# Patient Record
Sex: Female | Born: 1950 | Race: Black or African American | Hispanic: No | Marital: Married | Smoking: Former smoker
Health system: Southern US, Community
[De-identification: ages and names within clinical notes are randomized; demographics above are authoritative.]

## PROBLEM LIST (undated history)

## (undated) DIAGNOSIS — J449 Chronic obstructive pulmonary disease, unspecified: Secondary | ICD-10-CM

## (undated) HISTORY — PX: OTHER SURGICAL HISTORY: SHX169

## (undated) HISTORY — DX: Chronic obstructive pulmonary disease, unspecified: J44.9

---

## 2011-01-29 DIAGNOSIS — J449 Chronic obstructive pulmonary disease, unspecified: Secondary | ICD-10-CM | POA: Insufficient documentation

## 2011-09-20 DIAGNOSIS — D509 Iron deficiency anemia, unspecified: Secondary | ICD-10-CM | POA: Insufficient documentation

## 2013-03-24 DIAGNOSIS — E663 Overweight: Secondary | ICD-10-CM | POA: Insufficient documentation

## 2016-12-25 DIAGNOSIS — G4733 Obstructive sleep apnea (adult) (pediatric): Secondary | ICD-10-CM | POA: Insufficient documentation

## 2017-12-27 DIAGNOSIS — I10 Essential (primary) hypertension: Secondary | ICD-10-CM | POA: Diagnosis present

## 2018-03-29 DIAGNOSIS — D693 Immune thrombocytopenic purpura: Secondary | ICD-10-CM | POA: Insufficient documentation

## 2019-09-17 DIAGNOSIS — G8929 Other chronic pain: Secondary | ICD-10-CM | POA: Diagnosis not present

## 2019-09-17 DIAGNOSIS — M5441 Lumbago with sciatica, right side: Secondary | ICD-10-CM | POA: Diagnosis not present

## 2020-03-14 DIAGNOSIS — R918 Other nonspecific abnormal finding of lung field: Secondary | ICD-10-CM | POA: Insufficient documentation

## 2020-03-14 DIAGNOSIS — R7401 Elevation of levels of liver transaminase levels: Secondary | ICD-10-CM | POA: Diagnosis present

## 2020-08-17 ENCOUNTER — Ambulatory Visit
Admission: RE | Admit: 2020-08-17 | Discharge: 2020-08-17 | Disposition: A | Discharge status: Home / Self Care | Payer: Medicare Other | Attending: Family Medicine | Admitting: Family Medicine

## 2020-08-17 ENCOUNTER — Other Ambulatory Visit: Payer: Self-pay | Admitting: Family Medicine

## 2020-08-17 ENCOUNTER — Ambulatory Visit
Admission: RE | Admit: 2020-08-17 | Discharge: 2020-08-17 | Disposition: A | Discharge status: Home / Self Care | Payer: Medicare Other | Source: Ambulatory Visit | Attending: Family Medicine | Admitting: Family Medicine

## 2020-08-17 DIAGNOSIS — M25561 Pain in right knee: Secondary | ICD-10-CM

## 2020-08-17 DIAGNOSIS — M25562 Pain in left knee: Secondary | ICD-10-CM

## 2020-09-06 ENCOUNTER — Other Ambulatory Visit: Payer: Self-pay | Admitting: Family Medicine

## 2020-09-06 DIAGNOSIS — Z1231 Encounter for screening mammogram for malignant neoplasm of breast: Secondary | ICD-10-CM

## 2020-09-06 DIAGNOSIS — Z780 Asymptomatic menopausal state: Secondary | ICD-10-CM

## 2020-12-14 ENCOUNTER — Other Ambulatory Visit: Payer: Medicare Other

## 2021-02-21 ENCOUNTER — Inpatient Hospital Stay: Payer: Medicare Other

## 2021-02-21 ENCOUNTER — Other Ambulatory Visit: Payer: Self-pay

## 2021-02-21 ENCOUNTER — Emergency Department: Payer: Medicare Other

## 2021-02-21 ENCOUNTER — Observation Stay
Admission: EM | Admit: 2021-02-21 | Discharge: 2021-02-22 | Disposition: A | Discharge status: Home / Self Care | Payer: Medicare Other | Attending: Hospitalist | Admitting: Hospitalist

## 2021-02-21 DIAGNOSIS — R7989 Other specified abnormal findings of blood chemistry: Secondary | ICD-10-CM | POA: Diagnosis present

## 2021-02-21 DIAGNOSIS — R911 Solitary pulmonary nodule: Secondary | ICD-10-CM | POA: Diagnosis present

## 2021-02-21 DIAGNOSIS — Z79899 Other long term (current) drug therapy: Secondary | ICD-10-CM | POA: Diagnosis not present

## 2021-02-21 DIAGNOSIS — Z20822 Contact with and (suspected) exposure to covid-19: Secondary | ICD-10-CM | POA: Diagnosis not present

## 2021-02-21 DIAGNOSIS — I4891 Unspecified atrial fibrillation: Secondary | ICD-10-CM

## 2021-02-21 DIAGNOSIS — R600 Localized edema: Secondary | ICD-10-CM | POA: Insufficient documentation

## 2021-02-21 DIAGNOSIS — R079 Chest pain, unspecified: Secondary | ICD-10-CM | POA: Diagnosis present

## 2021-02-21 DIAGNOSIS — K219 Gastro-esophageal reflux disease without esophagitis: Secondary | ICD-10-CM | POA: Diagnosis present

## 2021-02-21 DIAGNOSIS — I10 Essential (primary) hypertension: Secondary | ICD-10-CM | POA: Insufficient documentation

## 2021-02-21 DIAGNOSIS — W19XXXA Unspecified fall, initial encounter: Secondary | ICD-10-CM | POA: Diagnosis not present

## 2021-02-21 DIAGNOSIS — G4733 Obstructive sleep apnea (adult) (pediatric): Secondary | ICD-10-CM | POA: Insufficient documentation

## 2021-02-21 DIAGNOSIS — J441 Chronic obstructive pulmonary disease with (acute) exacerbation: Secondary | ICD-10-CM | POA: Diagnosis not present

## 2021-02-21 DIAGNOSIS — R42 Dizziness and giddiness: Secondary | ICD-10-CM | POA: Insufficient documentation

## 2021-02-21 DIAGNOSIS — R778 Other specified abnormalities of plasma proteins: Secondary | ICD-10-CM | POA: Diagnosis not present

## 2021-02-21 DIAGNOSIS — E876 Hypokalemia: Secondary | ICD-10-CM | POA: Diagnosis present

## 2021-02-21 DIAGNOSIS — J449 Chronic obstructive pulmonary disease, unspecified: Secondary | ICD-10-CM | POA: Diagnosis present

## 2021-02-21 DIAGNOSIS — Y92009 Unspecified place in unspecified non-institutional (private) residence as the place of occurrence of the external cause: Secondary | ICD-10-CM | POA: Diagnosis not present

## 2021-02-21 DIAGNOSIS — R0789 Other chest pain: Secondary | ICD-10-CM | POA: Diagnosis present

## 2021-02-21 HISTORY — DX: Unspecified atrial fibrillation: I48.91

## 2021-02-21 LAB — CBC WITH DIFFERENTIAL/PLATELET
Abs Immature Granulocytes: 0.06 10*3/uL (ref 0.00–0.07)
Basophils Absolute: 0 10*3/uL (ref 0.0–0.1)
Basophils Relative: 1 %
Eosinophils Absolute: 0.1 10*3/uL (ref 0.0–0.5)
Eosinophils Relative: 2 %
HCT: 41.3 % (ref 36.0–46.0)
Hemoglobin: 13.1 g/dL (ref 12.0–15.0)
Immature Granulocytes: 2 %
Lymphocytes Relative: 39 %
Lymphs Abs: 1.6 10*3/uL (ref 0.7–4.0)
MCH: 31.3 pg (ref 26.0–34.0)
MCHC: 31.7 g/dL (ref 30.0–36.0)
MCV: 98.6 fL (ref 80.0–100.0)
Monocytes Absolute: 0.4 10*3/uL (ref 0.1–1.0)
Monocytes Relative: 10 %
Neutro Abs: 1.9 10*3/uL (ref 1.7–7.7)
Neutrophils Relative %: 46 %
Platelets: 220 10*3/uL (ref 150–400)
RBC: 4.19 MIL/uL (ref 3.87–5.11)
RDW: 17.4 % — ABNORMAL HIGH (ref 11.5–15.5)
WBC: 4.1 10*3/uL (ref 4.0–10.5)
nRBC: 0.7 % — ABNORMAL HIGH (ref 0.0–0.2)

## 2021-02-21 LAB — RESPIRATORY PANEL BY PCR

## 2021-02-21 LAB — COMPREHENSIVE METABOLIC PANEL
ALT: 57 U/L — ABNORMAL HIGH (ref 0–44)
AST: 58 U/L — ABNORMAL HIGH (ref 15–41)
Albumin: 3.7 g/dL (ref 3.5–5.0)
Alkaline Phosphatase: 88 U/L (ref 38–126)
Anion gap: 16 — ABNORMAL HIGH (ref 5–15)
BUN: <5 mg/dL — ABNORMAL LOW (ref 8–23)
CO2: 26 mmol/L (ref 22–32)
Calcium: 8.5 mg/dL — ABNORMAL LOW (ref 8.9–10.3)
Chloride: 101 mmol/L (ref 98–111)
Creatinine, Ser: 0.54 mg/dL (ref 0.44–1.00)
GFR, Estimated: >60 mL/min (ref >60)
Glucose, Bld: 74 mg/dL (ref 70–99)
Potassium: 3.1 mmol/L — ABNORMAL LOW (ref 3.5–5.1)
Sodium: 143 mmol/L (ref 135–145)
Total Bilirubin: 0.9 mg/dL (ref 0.3–1.2)
Total Protein: 7.4 g/dL (ref 6.5–8.1)

## 2021-02-21 LAB — PHOSPHORUS: Phosphorus: 3.1 mg/dL (ref 2.5–4.6)

## 2021-02-21 LAB — TROPONIN I (HIGH SENSITIVITY)
Troponin I (High Sensitivity): 18 ng/L — ABNORMAL HIGH (ref <18)
Troponin I (High Sensitivity): 16 ng/L (ref <18)
Troponin I (High Sensitivity): 17 ng/L (ref <18)

## 2021-02-21 LAB — RESP PANEL BY RT-PCR (FLU A&B, COVID) ARPGX2
Influenza A by PCR: NEGATIVE
Influenza B by PCR: NEGATIVE
SARS Coronavirus 2 by RT PCR: NEGATIVE

## 2021-02-21 LAB — HEMOGLOBIN A1C
Hgb A1c MFr Bld: 5.3 % (ref 4.8–5.6)
Mean Plasma Glucose: 105.41 mg/dL

## 2021-02-21 LAB — BRAIN NATRIURETIC PEPTIDE: B Natriuretic Peptide: 165 pg/mL — ABNORMAL HIGH (ref 0.0–100.0)

## 2021-02-21 LAB — MAGNESIUM: Magnesium: 1 mg/dL — ABNORMAL LOW (ref 1.7–2.4)

## 2021-02-21 MED ORDER — LORAZEPAM 2 MG/ML IJ SOLN
0.5000 mg | Freq: Once | INTRAMUSCULAR | Status: AC
  Administered 2021-02-21: 0.5 mg via INTRAVENOUS
  Filled 2021-02-21: qty 1

## 2021-02-21 MED ORDER — AZITHROMYCIN 250 MG PO TABS
250.0000 mg | ORAL_TABLET | Freq: Every day | ORAL | Status: DC
  Administered 2021-02-22: 250 mg via ORAL
  Filled 2021-02-21: qty 1

## 2021-02-21 MED ORDER — METHYLPREDNISOLONE SODIUM SUCC 40 MG IJ SOLR
40.0000 mg | Freq: Two times a day (BID) | INTRAMUSCULAR | Status: DC
  Administered 2021-02-21 – 2021-02-22 (×2): 40 mg via INTRAVENOUS
  Filled 2021-02-21 (×2): qty 1

## 2021-02-21 MED ORDER — ADULT MULTIVITAMIN W/MINERALS CH
1.0000 | ORAL_TABLET | Freq: Every day | ORAL | Status: DC
  Administered 2021-02-21 – 2021-02-22 (×2): 1 via ORAL
  Filled 2021-02-21 (×2): qty 1

## 2021-02-21 MED ORDER — APIXABAN 5 MG PO TABS
5.0000 mg | ORAL_TABLET | Freq: Two times a day (BID) | ORAL | Status: DC
  Administered 2021-02-21 – 2021-02-22 (×3): 5 mg via ORAL
  Filled 2021-02-21 (×3): qty 1

## 2021-02-21 MED ORDER — ALBUTEROL SULFATE (2.5 MG/3ML) 0.083% IN NEBU: 2.5000 mg | INHALATION_SOLUTION | RESPIRATORY_TRACT | Status: DC | PRN

## 2021-02-21 MED ORDER — DM-GUAIFENESIN ER 30-600 MG PO TB12: 1.0000 | ORAL_TABLET | Freq: Two times a day (BID) | ORAL | Status: DC | PRN

## 2021-02-21 MED ORDER — ONDANSETRON HCL 4 MG/2ML IJ SOLN: 4.0000 mg | Freq: Once | INTRAMUSCULAR | Status: AC

## 2021-02-21 MED ORDER — IOHEXOL 350 MG/ML SOLN
75.0000 mL | Freq: Once | INTRAVENOUS | Status: AC | PRN
  Administered 2021-02-21: 75 mL via INTRAVENOUS

## 2021-02-21 MED ORDER — ONDANSETRON HCL 4 MG/2ML IJ SOLN
INTRAMUSCULAR | Status: AC
  Administered 2021-02-21: 4 mg via INTRAVENOUS
  Filled 2021-02-21: qty 2

## 2021-02-21 MED ORDER — DILTIAZEM HCL 25 MG/5ML IV SOLN
20.0000 mg | Freq: Once | INTRAVENOUS | Status: AC
  Administered 2021-02-21: 20 mg via INTRAVENOUS

## 2021-02-21 MED ORDER — GABAPENTIN 300 MG PO CAPS
600.0000 mg | ORAL_CAPSULE | Freq: Two times a day (BID) | ORAL | Status: DC
  Administered 2021-02-21 – 2021-02-22 (×3): 600 mg via ORAL
  Filled 2021-02-21 (×3): qty 2

## 2021-02-21 MED ORDER — POTASSIUM CHLORIDE CRYS ER 20 MEQ PO TBCR
40.0000 meq | EXTENDED_RELEASE_TABLET | ORAL | Status: AC
  Administered 2021-02-21 (×2): 40 meq via ORAL
  Filled 2021-02-21 (×2): qty 2

## 2021-02-21 MED ORDER — ONDANSETRON HCL 4 MG/2ML IJ SOLN
4.0000 mg | Freq: Three times a day (TID) | INTRAMUSCULAR | Status: DC | PRN
  Administered 2021-02-21 (×2): 4 mg via INTRAVENOUS
  Filled 2021-02-21 (×2): qty 2

## 2021-02-21 MED ORDER — IPRATROPIUM-ALBUTEROL 0.5-2.5 (3) MG/3ML IN SOLN
3.0000 mL | Freq: Once | RESPIRATORY_TRACT | Status: AC
  Administered 2021-02-21: 3 mL via RESPIRATORY_TRACT
  Filled 2021-02-21: qty 6

## 2021-02-21 MED ORDER — ACETAMINOPHEN 325 MG PO TABS
650.0000 mg | ORAL_TABLET | Freq: Four times a day (QID) | ORAL | Status: DC | PRN
  Administered 2021-02-22: 650 mg via ORAL
  Filled 2021-02-21: qty 2

## 2021-02-21 MED ORDER — MORPHINE SULFATE (PF) 2 MG/ML IV SOLN: 1.0000 mg | INTRAVENOUS | Status: DC | PRN

## 2021-02-21 MED ORDER — METHYLPREDNISOLONE SODIUM SUCC 125 MG IJ SOLR
125.0000 mg | Freq: Once | INTRAMUSCULAR | Status: AC
  Administered 2021-02-21: 125 mg via INTRAVENOUS
  Filled 2021-02-21: qty 2

## 2021-02-21 MED ORDER — IPRATROPIUM-ALBUTEROL 0.5-2.5 (3) MG/3ML IN SOLN
3.0000 mL | RESPIRATORY_TRACT | Status: DC
  Administered 2021-02-21 – 2021-02-22 (×5): 3 mL via RESPIRATORY_TRACT
  Filled 2021-02-21 (×6): qty 3

## 2021-02-21 MED ORDER — PANTOPRAZOLE SODIUM 40 MG PO TBEC
40.0000 mg | DELAYED_RELEASE_TABLET | Freq: Every day | ORAL | Status: DC
  Administered 2021-02-21 – 2021-02-22 (×2): 40 mg via ORAL
  Filled 2021-02-21 (×2): qty 1

## 2021-02-21 MED ORDER — ALUM & MAG HYDROXIDE-SIMETH 200-200-20 MG/5ML PO SUSP: 30.0000 mL | Freq: Four times a day (QID) | ORAL | Status: DC | PRN

## 2021-02-21 MED ORDER — ASPIRIN EC 81 MG PO TBEC
81.0000 mg | DELAYED_RELEASE_TABLET | Freq: Every day | ORAL | Status: DC
  Administered 2021-02-21 – 2021-02-22 (×2): 81 mg via ORAL
  Filled 2021-02-21 (×2): qty 1

## 2021-02-21 MED ORDER — AMIODARONE HCL IN DEXTROSE 360-4.14 MG/200ML-% IV SOLN
60.0000 mg/h | INTRAVENOUS | Status: AC
  Administered 2021-02-21: 60 mg/h via INTRAVENOUS
  Filled 2021-02-21: qty 200

## 2021-02-21 MED ORDER — MAGNESIUM SULFATE 2 GM/50ML IV SOLN
2.0000 g | Freq: Once | INTRAVENOUS | Status: AC
  Administered 2021-02-21: 2 g via INTRAVENOUS
  Filled 2021-02-21: qty 50

## 2021-02-21 MED ORDER — DILTIAZEM HCL-DEXTROSE 125-5 MG/125ML-% IV SOLN (PREMIX)
5.0000 mg/h | INTRAVENOUS | Status: DC
  Administered 2021-02-21: 10 mg/h via INTRAVENOUS
  Administered 2021-02-21: 5 mg/h via INTRAVENOUS
  Administered 2021-02-22: 10 mg/h via INTRAVENOUS
  Filled 2021-02-21 (×3): qty 125

## 2021-02-21 MED ORDER — AZITHROMYCIN 500 MG PO TABS
500.0000 mg | ORAL_TABLET | Freq: Every day | ORAL | Status: AC
  Administered 2021-02-21: 500 mg via ORAL
  Filled 2021-02-21: qty 1

## 2021-02-21 MED ORDER — AMIODARONE HCL IN DEXTROSE 360-4.14 MG/200ML-% IV SOLN
30.0000 mg/h | INTRAVENOUS | Status: DC
  Administered 2021-02-21 – 2021-02-22 (×2): 30 mg/h via INTRAVENOUS
  Filled 2021-02-21 (×2): qty 200

## 2021-02-21 MED ORDER — PANTOPRAZOLE SODIUM 40 MG PO TBEC
40.0000 mg | DELAYED_RELEASE_TABLET | Freq: Every day | ORAL | Status: DC
  Administered 2021-02-21: 40 mg via ORAL
  Filled 2021-02-21: qty 1

## 2021-02-21 MED ORDER — POTASSIUM CHLORIDE CRYS ER 20 MEQ PO TBCR: 40.0000 meq | EXTENDED_RELEASE_TABLET | Freq: Once | ORAL | Status: DC

## 2021-02-21 MED ORDER — NITROGLYCERIN 0.4 MG SL SUBL
0.4000 mg | SUBLINGUAL_TABLET | SUBLINGUAL | Status: DC | PRN
  Administered 2021-02-22: 0.4 mg via SUBLINGUAL
  Filled 2021-02-21: qty 1

## 2021-02-21 MED ORDER — POTASSIUM CHLORIDE CRYS ER 20 MEQ PO TBCR
40.0000 meq | EXTENDED_RELEASE_TABLET | Freq: Once | ORAL | Status: AC
  Administered 2021-02-21: 40 meq via ORAL
  Filled 2021-02-21: qty 2

## 2021-02-21 NOTE — Progress Notes (Signed)
PT Cancellation Note  Patient Details Name: Anne Wells MRN: [id number] DOB: [birth year]   Cancelled Treatment:    Reason Eval/Treat Not Completed: Medical issues which prohibited therapy. Order received, pt chart reviewed. Spoke to RN prior to entering room who states pt has been entering states of tachycardia with very light mobility such as bed level. RN asks PT to hold to day and re-visit tomorrow. PT will follow up at later date as pt is appropriate.    Patrina Levering PT, DPT 02/21/21 2:59 PM [phone]

## 2021-02-21 NOTE — Progress Notes (Signed)
ANTICOAGULATION CONSULT NOTE  Pharmacy Consult for Apixaban Indication: atrial fibrillation  Allergies  Allergen Reactions   Penicillin G Rash    Patient Measurements: Height: 5\' 8"  (172.7 cm) Weight: 81.6 kg (180 lb) IBW/kg (Calculated) : 63.9  Vital Signs: Temp: 98 F (36.7 C) (11/08 0324) Temp Source: Oral (11/08 0324) BP: 142/79 (11/08 1139) Pulse Rate: 98 (11/08 1139)  Labs: Recent Labs    02/21/21 0339 02/21/21 0623 02/21/21 1013  HGB 13.1  --   --   HCT 41.3  --   --   PLT 220  --   --   CREATININE 0.54  --   --   TROPONINIHS 18* 16 17    Estimated Creatinine Clearance: 73.3 mL/min (by C-G formula based on SCr of 0.54 mg/dL).   Medical History: History reviewed. No pertinent past medical history.  Medications:  No anticoagulation PTA  Assessment: 70 y.Anne. female with medical history significant of COPD, GERD, OSA not on CPAP, who presented to ED with SOB and chest pain. Pt with new onset afib with RVR. Pharmacy has been consulted for Apixaban dosing.   Goal of Therapy:  Monitor platelets by anticoagulation protocol: Yes   Plan:  Start apixaban 5 mg BID Monitor CBC per protocol   Anne Wells Anne Wells 02/21/2021,1:18 PM

## 2021-02-21 NOTE — ED Notes (Signed)
Phlebotomist at bedside.

## 2021-02-21 NOTE — ED Provider Notes (Signed)
[facility] Emergency Department Provider Note ____________________________________________   Event Date/Time   First MD Initiated Contact with Patient 02/21/21 [phone]     (approximate)  I have reviewed the triage vital signs and the nursing notes.  HISTORY  Chief Complaint Dizziness and Chest Pain   HPI Anne Wells is a 70 y.o. femalewho presents to the ED for evaluation of chest discomfort and dizziness.  Chart review indicates history of COPD with previous tobacco abuse, OSA, GERD.  Patient presents to the ED for evaluation of 2 days of chest pressure, palpitations, shortness of breath and cough.  She reports that her COPD has been "acting up" for a few days now with increased shortness of breath and nonproductive cough.  She denies increased sputum production.  She reports 2-3 days of associated chest pressure and palpitations.  She reports dizziness with standing without syncope.  Denies any falls or trauma.  Denies fever, emesis or abdominal pain.  Reports mild chest pain now that is midsternal and nonradiating.  History reviewed. No pertinent past medical history.  Patient Active Problem List   Diagnosis Date Noted   Atrial fibrillation with RVR ([place]) 02/21/2021     History reviewed. No pertinent surgical history.  Prior to Admission medications   Not on File    Allergies Patient has no allergy information on record.  History reviewed. No pertinent family history.  Social History Social History   Tobacco Use   Smoking status: Never   Smokeless tobacco: Never  Substance Use Topics   Alcohol use: Yes   Drug use: Never    Review of Systems  Constitutional: No fever/chills Eyes: No visual changes. ENT: No sore throat. Cardiovascular: Positive for palpitations and chest pain. Respiratory: Positive for cough and shortness of breath. Gastrointestinal: No abdominal pain.  No nausea, no vomiting.  No diarrhea.  No  constipation. Genitourinary: Negative for dysuria. Musculoskeletal: Negative for back pain. Skin: Negative for rash. Neurological: Negative for headaches, focal weakness or numbness  ____________________________________________   PHYSICAL EXAM:  VITAL SIGNS: Vitals:   02/21/21 0500 02/21/21 0530  BP: (!) 163/103 (!) 151/107  Pulse: (!) 147 (!) 154  Resp: 18 14  Temp:    SpO2: 95% 92%     Constitutional: Alert and oriented.  Appears uncomfortable but not distressed. Eyes: Conjunctivae are normal. PERRL. EOMI. Head: Atraumatic. Nose: No congestion/rhinnorhea. Mouth/Throat: Mucous membranes are moist.  Oropharynx non-erythematous. Neck: No stridor. No cervical spine tenderness to palpation. Cardiovascular: Tachycardic and irregular rhythm. Grossly normal heart sounds.  Good peripheral circulation. Respiratory: Tachypneic to the mid 20s, no further evidence of distress.  Poor airflow throughout with diffuse expiratory wheezes.  No focal features. Gastrointestinal: Soft , nondistended, nontender to palpation. No CVA tenderness. Musculoskeletal: No lower extremity tenderness.  No joint effusions. No signs of acute trauma. Trace pitting edema to bilateral lower extremities. Neurologic:  Normal speech and language. No gross focal neurologic deficits are appreciated. No gait instability noted. Skin:  Skin is warm, dry and intact. No rash noted. Psychiatric: Mood and affect are normal. Speech and behavior are normal. ____________________________________________   LABS (all labs ordered are listed, but only abnormal results are displayed)  Labs Reviewed  MAGNESIUM - Abnormal; Notable for the following components:      Result Value   Magnesium 1.0 (*)    All other components within normal limits  COMPREHENSIVE METABOLIC PANEL - Abnormal; Notable for the following components:   Potassium 3.1 (*)    BUN <  5 (*)    Calcium 8.5 (*)    AST 58 (*)    ALT 57 (*)    Anion gap 16 (*)     All other components within normal limits  CBC WITH DIFFERENTIAL/PLATELET - Abnormal; Notable for the following components:   RDW 17.4 (*)    nRBC 0.7 (*)    All other components within normal limits  BRAIN NATRIURETIC PEPTIDE - Abnormal; Notable for the following components:   B Natriuretic Peptide 165.0 (*)    All other components within normal limits  TROPONIN I (HIGH SENSITIVITY) - Abnormal; Notable for the following components:   Troponin I (High Sensitivity) 18 (*)    All other components within normal limits  RESP PANEL BY RT-PCR (FLU A&B, COVID) ARPGX2  TROPONIN I (HIGH SENSITIVITY)   ____________________________________________  12 Lead EKG  A. fib with RVR, rate of 171 bpm.  Normal axis.  No STEMI. ____________________________________________  RADIOLOGY  ED MD interpretation:  CXR reviewed by me without evidence of acute cardiopulmonary pathology. Right apical bled noted.   Official radiology report(s): DG Chest Portable 1 View  Result Date: 02/21/2021 CLINICAL DATA:  COPD exacerbation.  New onset atrial fibrillation EXAM: PORTABLE CHEST 1 VIEW COMPARISON:  None. FINDINGS: Normal heart size and mediastinal contours. Generalized interstitial coarsening and generous lung volumes. Bleb at the right apex which has a radiographically thin wall. There is no edema, consolidation, effusion, or pneumothorax. IMPRESSION: COPD with right apical bleb.  No acute finding. Electronically Signed   By: Jorje Guild M.D.   On: 02/21/2021 04:16    ____________________________________________   PROCEDURES and INTERVENTIONS  Procedure(s) performed (including Critical Care):  .1-3 Lead EKG Interpretation Performed by: Vladimir Crofts, MD Authorized by: Vladimir Crofts, MD     Interpretation: abnormal     ECG rate:  171   ECG rate assessment: tachycardic     Rhythm: atrial fibrillation     Ectopy: none     Conduction: normal   .Critical Care Performed by: Vladimir Crofts, MD Authorized  by: Vladimir Crofts, MD   Critical care provider statement:    Critical care time (minutes):  30   Critical care was necessary to treat or prevent imminent or life-threatening deterioration of the following conditions:  Cardiac failure   Critical care was time spent personally by me on the following activities:  Ordering and performing treatments and interventions, ordering and review of laboratory studies, ordering and review of radiographic studies, pulse oximetry, re-evaluation of patient's condition, review of old charts, evaluation of patient's response to treatment, development of treatment plan with patient or surrogate and examination of patient  Medications  diltiazem (CARDIZEM) 125 mg in dextrose 5% 125 mL (1 mg/mL) infusion (10 mg/hr Intravenous Rate/Dose Change 02/21/21 0425)  magnesium sulfate IVPB 2 g 50 mL (has no administration in time range)  potassium chloride SA (KLOR-CON) CR tablet 40 mEq (has no administration in time range)  ipratropium-albuterol (DUONEB) 0.5-2.5 (3) MG/3ML nebulizer solution 3 mL (3 mLs Nebulization Given 02/21/21 0409)  methylPREDNISolone sodium succinate (SOLU-MEDROL) 125 mg/2 mL injection 125 mg (125 mg Intravenous Given 02/21/21 0409)  diltiazem (CARDIZEM) injection 20 mg (20 mg Intravenous Given 02/21/21 0350)  ipratropium-albuterol (DUONEB) 0.5-2.5 (3) MG/3ML nebulizer solution 3 mL (3 mLs Nebulization Given 02/21/21 0509)  ondansetron (ZOFRAN) injection 4 mg (4 mg Intravenous Given 02/21/21 0456)    ____________________________________________   MDM / ED COURSE   70 year old woman with known COPD visited ED with evidence of a COPD  exacerbation precipitating new onset atrial fibrillation with RVR, requiring diltiazem drip and admission.  She is tachycardic with rapid rates, but hemodynamically stable.  Exam with some evidence of volume overload with peripheral edema, as well as stigmata primarily of COPD exacerbation.  Blood work with mild hypokalemia and  hypomagnesemia, that we will replete orally and IV.  BNP is somewhat elevated, consistent with dysfunction associated with RVR and her clinical exam.  X-ray without infiltrates, PTX or CAP.  Improved respiratory status with breathing treatments.  Slowing rates with diltiazem drip.  We will admit to medicine.  Clinical Course as of 02/21/21 0606  Tue Feb 21, 2021  [id number] She has been going in and out of A. fib.  Nurse did capture 1 twelve-lead with a sinus rhythm.  She is clinically improving reports improved respiratory symptoms with breathing treatments.  Remains hemodynamically stable on diltiazem drip [DS]    Clinical Course User Index [DS] Vladimir Crofts, MD    ____________________________________________   FINAL CLINICAL IMPRESSION(S) / ED DIAGNOSES  Final diagnoses:  New onset atrial fibrillation ([place])  Atrial fibrillation with RVR ([place])  COPD exacerbation ([place])  Hypokalemia  Hypomagnesemia     ED Discharge Orders     None        Messina Kosinski   Note:  This document was prepared using Dragon voice recognition software and may include unintentional dictation errors.    Vladimir Crofts, MD 02/21/21 [phone]

## 2021-02-21 NOTE — Consult Note (Signed)
Anne Wells is a 70 y.o. female  [id number]  Primary Cardiologist: Neoma Laming Reason for Consultation: Atrial fibrillation  HPI: This is a 70 year old African-American female with history of hypertension presented to the hospital with chest pain and palpitation with EKG showing atrial fibrillation with rapid ventricular response rate at heart rate of 170.  Patient denies any chest pain at this time.   Review of Systems: No chest pain at this time   History reviewed. No pertinent past medical history.  (Not in a hospital admission)     aspirin EC  81 mg Oral Daily   azithromycin  500 mg Oral Daily   Followed by   Derrill Memo ON 02/22/2021] azithromycin  250 mg Oral Daily   ipratropium-albuterol  3 mL Nebulization Q4H   pantoprazole  40 mg Oral Daily   potassium chloride  40 mEq Oral Q4H    Infusions:  diltiazem (CARDIZEM) infusion 10 mg/hr (02/21/21 0425)   magnesium sulfate bolus IVPB      Allergies  Allergen Reactions   Penicillin G Rash    Social History   Socioeconomic History   Marital status: Legally Separated    Spouse name: Not on file   Number of children: Not on file   Years of education: Not on file   Highest education level: Not on file  Occupational History   Not on file  Tobacco Use   Smoking status: Former    Types: Cigarettes   Smokeless tobacco: Never  Substance and Sexual Activity   Alcohol use: Yes   Drug use: Never   Sexual activity: Not on file  Other Topics Concern   Not on file  Social History Narrative   Not on file   Social Determinants of Health   Financial Resource Strain: Not on file  Food Insecurity: Not on file  Transportation Needs: Not on file  Physical Activity: Not on file  Stress: Not on file  Social Connections: Not on file  Intimate Partner Violence: Not on file    Family History  Problem Relation Age of Onset   COPD Father    Bone cancer Brother    COPD Brother     PHYSICAL EXAM: Vitals:   02/21/21  0815 02/21/21 0845  BP: (!) 141/83 132/79  Pulse: (!) 104 88  Resp: 17 17  Temp:    SpO2: 95% 96%     Intake/Output Summary (Last 24 hours) at 02/21/2021 0930 Last data filed at 02/21/2021 0730 Gross per 24 hour  Intake 50 ml  Output --  Net 50 ml    General:  Well appearing. No respiratory difficulty HEENT: normal Neck: supple. no JVD. Carotids 2+ bilat; no bruits. No lymphadenopathy or thryomegaly appreciated. Cor: PMI nondisplaced. Regular rate & rhythm. No rubs, gallops or murmurs. Lungs: clear Abdomen: soft, nontender, nondistended. No hepatosplenomegaly. No bruits or masses. Good bowel sounds. Extremities: no cyanosis, clubbing, rash, edema Neuro: alert & oriented x 3, cranial nerves grossly intact. moves all 4 extremities w/o difficulty. Affect pleasant.  ECG: Atrial fibrillation with rapid ventricular response rate 170 bpm  Results for orders placed or performed during the hospital encounter of 02/21/21 (from the past 24 hour(s))  Magnesium     Status: Abnormal   Collection Time: 02/21/21  3:39 AM  Result Value Ref Range   Magnesium 1.0 (L) 1.7 - 2.4 mg/dL  Comprehensive metabolic panel     Status: Abnormal   Collection Time: 02/21/21  3:39 AM  Result Value Ref  Range   Sodium 143 135 - 145 mmol/L   Potassium 3.1 (L) 3.5 - 5.1 mmol/L   Chloride 101 98 - 111 mmol/L   CO2 26 22 - 32 mmol/L   Glucose, Bld 74 70 - 99 mg/dL   BUN <5 (L) 8 - 23 mg/dL   Creatinine, Ser 0.54 0.44 - 1.00 mg/dL   Calcium 8.5 (L) 8.9 - 10.3 mg/dL   Total Protein 7.4 6.5 - 8.1 g/dL   Albumin 3.7 3.5 - 5.0 g/dL   AST 58 (H) 15 - 41 U/L   ALT 57 (H) 0 - 44 U/L   Alkaline Phosphatase 88 38 - 126 U/L   Total Bilirubin 0.9 0.3 - 1.2 mg/dL   GFR, Estimated >60 >60 mL/min   Anion gap 16 (H) 5 - 15  CBC with Differential/Platelet     Status: Abnormal   Collection Time: 02/21/21  3:39 AM  Result Value Ref Range   WBC 4.1 4.0 - 10.5 K/uL   RBC 4.19 3.87 - 5.11 MIL/uL   Hemoglobin 13.1 12.0 -  15.0 g/dL   HCT 41.3 36.0 - 46.0 %   MCV 98.6 80.0 - 100.0 fL   MCH 31.3 26.0 - 34.0 pg   MCHC 31.7 30.0 - 36.0 g/dL   RDW 17.4 (H) 11.5 - 15.5 %   Platelets 220 150 - 400 K/uL   nRBC 0.7 (H) 0.0 - 0.2 %   Neutrophils Relative % 46 %   Neutro Abs 1.9 1.7 - 7.7 K/uL   Lymphocytes Relative 39 %   Lymphs Abs 1.6 0.7 - 4.0 K/uL   Monocytes Relative 10 %   Monocytes Absolute 0.4 0.1 - 1.0 K/uL   Eosinophils Relative 2 %   Eosinophils Absolute 0.1 0.0 - 0.5 K/uL   Basophils Relative 1 %   Basophils Absolute 0.0 0.0 - 0.1 K/uL   Immature Granulocytes 2 %   Abs Immature Granulocytes 0.06 0.00 - 0.07 K/uL  Brain natriuretic peptide     Status: Abnormal   Collection Time: 02/21/21  3:39 AM  Result Value Ref Range   B Natriuretic Peptide 165.0 (H) 0.0 - 100.0 pg/mL  Troponin I (High Sensitivity)     Status: Abnormal   Collection Time: 02/21/21  3:39 AM  Result Value Ref Range   Troponin I (High Sensitivity) 18 (H) <18 ng/L  Resp Panel by RT-PCR (Flu A&B, Covid) Nasopharyngeal Swab     Status: None   Collection Time: 02/21/21  4:04 AM   Specimen: Nasopharyngeal Swab; Nasopharyngeal(NP) swabs in vial transport medium  Result Value Ref Range   SARS Coronavirus 2 by RT PCR NEGATIVE NEGATIVE   Influenza A by PCR NEGATIVE NEGATIVE   Influenza B by PCR NEGATIVE NEGATIVE  Troponin I (High Sensitivity)     Status: None   Collection Time: 02/21/21  6:23 AM  Result Value Ref Range   Troponin I (High Sensitivity) 16 <18 ng/L  Phosphorus     Status: None   Collection Time: 02/21/21  6:23 AM  Result Value Ref Range   Phosphorus 3.1 2.5 - 4.6 mg/dL   DG Chest Portable 1 View  Result Date: 02/21/2021 CLINICAL DATA:  COPD exacerbation.  New onset atrial fibrillation EXAM: PORTABLE CHEST 1 VIEW COMPARISON:  None. FINDINGS: Normal heart size and mediastinal contours. Generalized interstitial coarsening and generous lung volumes. Bleb at the right apex which has a radiographically thin wall. There is  no edema, consolidation, effusion, or pneumothorax. IMPRESSION: COPD with right  apical bleb.  No acute finding. Electronically Signed   By: Jorje Guild M.D.   On: 02/21/2021 04:16     ASSESSMENT AND PLAN: Atrial fibrillation with rapid ventricular response rate currently at 100 bpm on Cardizem drip.  We will add amiodarone drip.  Once converted to sinus rhythm we will switch over to 400 p.o. twice daily of amiodarone.  Advise adding Eliquis.  Dilcia Rybarczyk A

## 2021-02-21 NOTE — ED Triage Notes (Signed)
Pt presents to ER c/o chest pain, dizziness, nausea and vomiting x2 days.  Pt states she came in tonight d/t her symptoms becoming worse and not resolving at home.  Pt noted to be in afib w/rvr on her ECG.  Dr. Beather Arbour made aware.  Pt denies hx of afib.  Does not take blood thinners.

## 2021-02-21 NOTE — ED Notes (Signed)
Pt ambulatory to restroom

## 2021-02-21 NOTE — H&P (Addendum)
History and Physical    Anne Wells SMO:[id number] DOB: 08/25/1950 DOA: 02/21/2021  Referring MD/NP/PA:   PCP: [facility] [place]   Patient coming from:  The patient is coming from home.  At baseline, pt is independent for most of ADL.        Chief Complaint: SOB and chest pain  HPI: Anne Wells is a 70 y.o. female with medical history significant of COPD, GERD, OSA not on CPAP, follow-up smoker, who presents with SOB and chest pain.  Patient states that she has chest pain and shortness of breath for more than 2 days.  Her chest pain is located in substernal area, mild to moderate, sharp, nonradiating, not pleuritic, not aggravated by deep breath. Her shortness breath has been progressively worsening.  Patient has cough with little mucus production.  Denies fever or chills.  Patient has wheezing sometimes.  She states she has nausea, vomited twice earlier, which has resolved.  No nausea, vomiting, diarrhea or abdominal pain currently.  No symptoms of UTI.  Patient states that she fell out of the bed accidentally 3 days ago, with possible head injury.  Patient does not have headache or neck pain.  Patient was found to have new atrial fibrillation with RVR, heart rate up to 170s, Cardizem drip was started with improvement.  ED Course: pt was found to have negative COVID PCR, troponin level 18, 16, WBC 4.1, potassium 3.1, magnesium 1.0, temperature normal, blood pressure 138/89, RR 20, oxygen saturation 92 on room air, which improved to 97% on 3 L oxygen. Chest x-ray showed COPD and right apical bleb.  CT of the head is negative for acute intracranial abnormalities. om air. CT angiogram of chest is negative for PE, but showed a pulmonary nodule.  Patient is admitted to progressive bed as inpatient.  Dr. Humphrey Rolls of cardiology is consulted.  Review of Systems:   General: no fevers, chills, no body weight gain, has fatigue HEENT: no blurry vision, hearing changes or sore  throat Respiratory: has dyspnea, coughing, wheezing CV: has chest pain, no palpitations GI: had nausea, vomiting, no abdominal pain, diarrhea, constipation GU: no dysuria, burning on urination, increased urinary frequency, hematuria  Ext: no leg edema Neuro: no unilateral weakness, numbness, or tingling, no vision change or hearing loss. Has fall. Skin: no rash, no skin tear. MSK: No muscle spasm, no deformity, no limitation of range of movement in spin Heme: No easy bruising.  Travel history: No recent long distant travel.  Allergy:  Allergies  Allergen Reactions   Penicillin G Rash    History reviewed. No pertinent past medical history.  Past Surgical History:  Procedure Laterality Date   Dental procedure      Social History:  reports that she has quit smoking. Her smoking use included cigarettes. She has never used smokeless tobacco. She reports current alcohol use. She reports that she does not use drugs.  Family History:  Family History  Problem Relation Age of Onset   COPD Father    Bone cancer Brother    COPD Brother      Prior to Admission medications   Not on File    Physical Exam: Vitals:   02/21/21 0845 02/21/21 1000 02/21/21 1115 02/21/21 1139  BP: 132/79 138/82  (!) 142/79  Pulse: 88 (!) 108 93 98  Resp: 17 17 16  (!) 23  Temp:      TempSrc:      SpO2: 96% 97% 96% 100%  Weight:  Height:       General: Not in acute distress HEENT:       Eyes: PERRL, EOMI, no scleral icterus.       ENT: No discharge from the ears and nose, no pharynx injection, no tonsillar enlargement.        Neck: No JVD, no bruit, no mass felt. Heme: No neck lymph node enlargement. Cardiac: S1/S2, RRR, No murmurs, No gallops or rubs. Respiratory: Has decreased air movement bilaterally with mild wheezing bilaterally GI: Soft, nondistended, nontender, no rebound pain, no organomegaly, BS present. GU: No hematuria Ext: No pitting leg edema bilaterally. 1+DP/PT pulse  bilaterally. Musculoskeletal: No joint deformities, No joint redness or warmth, no limitation of ROM in spin. Skin: No rashes.  Neuro: Alert, oriented X3, cranial nerves II-XII grossly intact, moves all extremities normally.  Psych: Patient is not psychotic, no suicidal or hemocidal ideation.  Labs on Admission: I have personally reviewed following labs and imaging studies  CBC: Recent Labs  Lab 02/21/21 0339  WBC 4.1  NEUTROABS 1.9  HGB 13.1  HCT 41.3  MCV 98.6  PLT 841   Basic Metabolic Panel: Recent Labs  Lab 02/21/21 0339 02/21/21 0623  NA 143  --   K 3.1*  --   CL 101  --   CO2 26  --   GLUCOSE 74  --   BUN <5*  --   CREATININE 0.54  --   CALCIUM 8.5*  --   MG 1.0*  --   PHOS  --  3.1   GFR: Estimated Creatinine Clearance: 73.3 mL/min (by C-G formula based on SCr of 0.54 mg/dL). Liver Function Tests: Recent Labs  Lab 02/21/21 0339  AST 58*  ALT 57*  ALKPHOS 88  BILITOT 0.9  PROT 7.4  ALBUMIN 3.7   No results for input(s): LIPASE, AMYLASE in the last 168 hours. No results for input(s): AMMONIA in the last 168 hours. Coagulation Profile: No results for input(s): INR, PROTIME in the last 168 hours. Cardiac Enzymes: No results for input(s): CKTOTAL, CKMB, CKMBINDEX, TROPONINI in the last 168 hours. BNP (last 3 results) No results for input(s): PROBNP in the last 8760 hours. HbA1C: No results for input(s): HGBA1C in the last 72 hours. CBG: No results for input(s): GLUCAP in the last 168 hours. Lipid Profile: No results for input(s): CHOL, HDL, LDLCALC, TRIG, CHOLHDL, LDLDIRECT in the last 72 hours. Thyroid Function Tests: No results for input(s): TSH, T4TOTAL, FREET4, T3FREE, THYROIDAB in the last 72 hours. Anemia Panel: No results for input(s): VITAMINB12, FOLATE, FERRITIN, TIBC, IRON, RETICCTPCT in the last 72 hours. Urine analysis: No results found for: COLORURINE, APPEARANCEUR, LABSPEC, PHURINE, GLUCOSEU, HGBUR, BILIRUBINUR, KETONESUR, PROTEINUR,  UROBILINOGEN, NITRITE, LEUKOCYTESUR Sepsis Labs: @LABRCNTIP (procalcitonin:4,lacticidven:4) ) Recent Results (from the past 240 hour(s))  Resp Panel by RT-PCR (Flu A&B, Covid) Nasopharyngeal Swab     Status: None   Collection Time: 02/21/21  4:04 AM   Specimen: Nasopharyngeal Swab; Nasopharyngeal(NP) swabs in vial transport medium  Result Value Ref Range Status   SARS Coronavirus 2 by RT PCR NEGATIVE NEGATIVE Final    Comment: (NOTE) SARS-CoV-2 target nucleic acids are NOT DETECTED.  The SARS-CoV-2 RNA is generally detectable in upper respiratory specimens during the acute phase of infection. The lowest concentration of SARS-CoV-2 viral copies this assay can detect is 138 copies/mL. A negative result does not preclude SARS-Cov-2 infection and should not be used as the sole basis for treatment or other patient management decisions. A negative result may occur  with  improper specimen collection/handling, submission of specimen other than nasopharyngeal swab, presence of viral mutation(s) within the areas targeted by this assay, and inadequate number of viral copies(<138 copies/mL). A negative result must be combined with clinical observations, patient history, and epidemiological information. The expected result is Negative.  Fact Sheet for Patients:  EntrepreneurPulse.com.au  Fact Sheet for Healthcare Providers:  IncredibleEmployment.be  This test is no t yet approved or cleared by the [place] FDA and  has been authorized for detection and/or diagnosis of SARS-CoV-2 by FDA under an Emergency Use Authorization (EUA). This EUA will remain  in effect (meaning this test can be used) for the duration of the COVID-19 declaration under Section 564(b)(1) of the Act, 21 U.S.C.section 360bbb-3(b)(1), unless the authorization is terminated  or revoked sooner.       Influenza A by PCR NEGATIVE NEGATIVE Final   Influenza B by PCR NEGATIVE NEGATIVE  Final    Comment: (NOTE) The Xpert Xpress SARS-CoV-2/FLU/RSV plus assay is intended as an aid in the diagnosis of influenza from Nasopharyngeal swab specimens and should not be used as a sole basis for treatment. Nasal washings and aspirates are unacceptable for Xpert Xpress SARS-CoV-2/FLU/RSV testing.  Fact Sheet for Patients: EntrepreneurPulse.com.au  Fact Sheet for Healthcare Providers: IncredibleEmployment.be  This test is not yet approved or cleared by the [place] FDA and has been authorized for detection and/or diagnosis of SARS-CoV-2 by FDA under an Emergency Use Authorization (EUA). This EUA will remain in effect (meaning this test can be used) for the duration of the COVID-19 declaration under Section 564(b)(1) of the Act, 21 U.S.C. section 360bbb-3(b)(1), unless the authorization is terminated or revoked.  Performed at [facility], [address] [place], [place] [address]      Radiological Exams on Admission: CT HEAD WO CONTRAST (5MM)  Result Date: 02/21/2021 CLINICAL DATA:  Chest pain, dizziness, nausea and vomiting. Atrial fibrillation. EXAM: CT HEAD WITHOUT CONTRAST TECHNIQUE: Contiguous axial images were obtained from the base of the skull through the vertex without intravenous contrast. COMPARISON:  None. FINDINGS: Brain: The brainstem, cerebellum, cerebral peduncles, thalami, basal ganglia, basilar cisterns, and ventricular system appear within normal limits. No intracranial hemorrhage, mass lesion, or acute CVA. 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 Other: No supplemental non-categorized findings. IMPRESSION: 1. No acute intracranial findings. 2. 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 Electronically Signed   By: Van Clines M.D.   On: 02/21/2021 09:47   CT Angio Chest Pulmonary Embolism (PE) W or WO  Contrast  Result Date: 02/21/2021 CLINICAL DATA:  Chest pain and dizziness with nausea and vomiting for 2 days. EXAM: CT ANGIOGRAPHY CHEST WITH CONTRAST TECHNIQUE: Multidetector CT imaging of the chest was performed using the standard protocol during bolus administration of intravenous contrast. Multiplanar CT image reconstructions and MIPs were obtained to evaluate the vascular anatomy. CONTRAST:  87mL OMNIPAQUE IOHEXOL 350 MG/ML SOLN COMPARISON:  None. FINDINGS: Cardiovascular: Satisfactory opacification of the pulmonary arteries to the segmental level. No evidence of pulmonary embolism. Normal heart size. No pericardial effusion. Mediastinum/Nodes: Moderate sliding hiatal hernia. No adenopathy or acute inflammation. Granulomatous type calcifications of right hilar lymph nodes Lungs/Pleura: Centrilobular, paraseptal, and panlobular emphysema at the apices. Bilobed nodule in the right upper lobe measuring 20 x 11 mm. No lobar consolidation, edema, effusion, or pneumothorax. Upper Abdomen: Hepatic steatosis. Musculoskeletal: No acute finding Review of the MIP images confirms the above findings. IMPRESSION: 1. 20 x 11  mm pulmonary nodule in the right upper lobe. Consider one of the following in 3 months for both low-risk and high-risk individuals: (a) repeat chest CT, (b) follow-up PET-CT, or (c) tissue sampling. This recommendation follows the consensus statement: Guidelines for Management of Incidental Pulmonary Nodules Detected on CT Images: From the Fleischner Society 2017; Radiology 2017; 284:228-243. 2. Negative for pulmonary embolism. 3. Aortic Atherosclerosis (ICD10-I70.0) and Emphysema (ICD10-J43.9). Electronically Signed   By: Jorje Guild M.D.   On: 02/21/2021 09:36   DG Chest Portable 1 View  Result Date: 02/21/2021 CLINICAL DATA:  COPD exacerbation.  New onset atrial fibrillation EXAM: PORTABLE CHEST 1 VIEW COMPARISON:  None. FINDINGS: Normal heart size and mediastinal contours. Generalized  interstitial coarsening and generous lung volumes. Bleb at the right apex which has a radiographically thin wall. There is no edema, consolidation, effusion, or pneumothorax. IMPRESSION: COPD with right apical bleb.  No acute finding. Electronically Signed   By: Jorje Guild M.D.   On: 02/21/2021 04:16     EKG: I have personally reviewed.  Atrial fibrillation with RVR, QTC 472, heart rate of 171, early R wave progression  Assessment/Plan Principal Problem:   COPD exacerbation (HCC) Active Problems:   Atrial fibrillation with RVR (HCC)   Chest pain   Hypokalemia   Hypomagnesemia   New onset atrial fibrillation (HCC)   Elevated troponin   GERD (gastroesophageal reflux disease)   Fall at home, initial encounter   Pulmonary nodule   COPD exacerbation [facility]): Patient has cough, shortness of breath, decreased air movement with wheezing on auscultation, negative infiltration on chest x-ray, clinically consistent with a COPD exacerbation.  - will admit to progressive unit as inpatient -Bronchodilators -Patient received 2 g of magnesium sulfate in ED due to hypomagnesemia -Solu-Medrol 40 mg IV bid -Z pak  -Mucinex for cough  -Incentive spirometry -check RVP -Nasal cannula oxygen as needed to maintain O2 saturation 93% or greater  New onset atrial fibrillation with RVR ([place]): HR is up to 170s. CHA2DS2-VASc Score is 2, will need oral anticoagulation, but this is likely triggered by COPD exacerbation and electrolytes disturbance with hypokalemia and hypomagnesemia. Dr. Eda Keys of card is consulted, he recommended to start Eliquis. I discussed with her daughter and explained the risk and benefit of starting Eliquis. She agreed to start pt on Eliquis.  -continue cardizem gtt --> changed to amiodarone drip by card -repleted K and Mg -check TSH -Eliquis per pharm  Chest pain and elevated troponin: trop 18 --> 16. Likley due to demand ischemia -Aspirin 81 mg daily -As needed nitroglycerin and  morphine -Trend troponin -Check A1c, FLP  Hypokalemia and hypomagnesemia: K=3.1 and Mg 1.0 -repleted both -check Phosphorus level  GERD (gastroesophageal reflux disease) -Protonix  Fall at home, initial encounter: CT head is negative for acute intracranial abnormalities -Fall precaution -PT  Pulmonary nodule: CTA incidentally showed a 20 x 11 mm pulmonary nodule in the right upper lobe. -this must be followed by repeating CT in 3 month, or giving referral to pulmonologist for possible biopsy   DVT ppx: SQ Lovenox Code Status: Full code Family Communication:  Yes, patient's daughter and granddaughter at bed side Disposition Plan:  Anticipate discharge back to previous environment Consults called: Dr. Humphrey Rolls of of cardiology Admission status and Level of care: Progressive Cardiac:   as inpt       Status is: Inpatient  Remains inpatient appropriate because: Patient has multiple comorbidities, now presents with COPD exacerbation, found to have new onset atrial fibrillation with  RVR, also has electrolytes disturbance with hypokalemia and hypomagnesemia.  Her presentation is highly complicated.  Patient is at high risk of deteriorating.  Will need to be treated in the hospital for at least 2 days          Date of Service 02/21/2021    [facility]   If 7PM-7AM, please contact night-coverage [email or web address] 02/21/2021, 12:59 PM

## 2021-02-21 NOTE — Progress Notes (Signed)
  Amiodarone Drug - Drug Interaction Consult Note  Recommendations: Diltiazem - monitor HR/BP Azithromycin - Qtc prolongation; K 3.1 and Mg 1.0 replaced; azithromycin scheduled to stop 11/12  Amiodarone is metabolized by the cytochrome P450 system and therefore has the potential to cause many drug interactions. Amiodarone has an average plasma half-life of 50 days (range 20 to 100 days).   There is potential for drug interactions to occur several weeks or months after stopping treatment and the onset of drug interactions may be slow after initiating amiodarone.   []  Statins: Increased risk of myopathy. Simvastatin- restrict dose to 20mg  daily. Other statins: counsel patients to report any muscle pain or weakness immediately.  []  Anticoagulants: Amiodarone can increase anticoagulant effect. Consider warfarin dose reduction. Patients should be monitored closely and the dose of anticoagulant altered accordingly, remembering that amiodarone levels take several weeks to stabilize.  []  Antiepileptics: Amiodarone can increase plasma concentration of phenytoin, the dose should be reduced. Note that small changes in phenytoin dose can result in large changes in levels. Monitor patient and counsel on signs of toxicity.  []  Beta blockers: increased risk of bradycardia, AV block and myocardial depression. Sotalol - avoid concomitant use.  [x]   Calcium channel blockers (diltiazem and verapamil): increased risk of bradycardia, AV block and myocardial depression.  []   Cyclosporine: Amiodarone increases levels of cyclosporine. Reduced dose of cyclosporine is recommended.  []  Digoxin dose should be halved when amiodarone is started.  []  Diuretics: increased risk of cardiotoxicity if hypokalemia occurs.  []  Oral hypoglycemic agents (glyburide, glipizide, glimepiride): increased risk of hypoglycemia. Patient's glucose levels should be monitored closely when initiating amiodarone therapy.   [x]  Drugs that  prolong the QT interval:  Torsades de pointes risk may be increased with concurrent use - avoid if possible.  Monitor QTc, also keep magnesium/potassium WNL if concurrent therapy can't be avoided.  Antibiotics: e.g. fluoroquinolones, erythromycin.  Antiarrhythmics: e.g. quinidine, procainamide, disopyramide, sotalol.  Antipsychotics: e.g. phenothiazines, haloperidol.   Lithium, tricyclic antidepressants, and methadone. Thank You,  Anne Wells  02/21/2021 10:08 AM

## 2021-02-22 DIAGNOSIS — J441 Chronic obstructive pulmonary disease with (acute) exacerbation: Secondary | ICD-10-CM | POA: Diagnosis not present

## 2021-02-22 LAB — HIV ANTIBODY (ROUTINE TESTING W REFLEX): HIV Screen 4th Generation wRfx: NONREACTIVE

## 2021-02-22 LAB — TROPONIN I (HIGH SENSITIVITY)
Troponin I (High Sensitivity): 12 ng/L (ref <18)
Troponin I (High Sensitivity): 12 ng/L (ref <18)

## 2021-02-22 LAB — BASIC METABOLIC PANEL
Anion gap: 11 (ref 5–15)
BUN: 13 mg/dL (ref 8–23)
CO2: 28 mmol/L (ref 22–32)
Calcium: 9 mg/dL (ref 8.9–10.3)
Chloride: 98 mmol/L (ref 98–111)
Creatinine, Ser: 0.79 mg/dL (ref 0.44–1.00)
GFR, Estimated: >60 mL/min (ref >60)
Glucose, Bld: 144 mg/dL — ABNORMAL HIGH (ref 70–99)
Potassium: 4.5 mmol/L (ref 3.5–5.1)
Sodium: 137 mmol/L (ref 135–145)

## 2021-02-22 LAB — LIPID PANEL
Cholesterol: 355 mg/dL — ABNORMAL HIGH (ref 0–200)
HDL: 179 mg/dL (ref >40)
LDL Cholesterol: 166 mg/dL — ABNORMAL HIGH (ref 0–99)
Total CHOL/HDL Ratio: 2 RATIO
Triglycerides: 51 mg/dL (ref <150)
VLDL: 10 mg/dL (ref 0–40)

## 2021-02-22 LAB — MAGNESIUM: Magnesium: 1.7 mg/dL (ref 1.7–2.4)

## 2021-02-22 LAB — TSH: TSH: 1.158 u[IU]/mL (ref 0.350–4.500)

## 2021-02-22 MED ORDER — MELOXICAM 7.5 MG PO TABS: 7.5000 mg | ORAL_TABLET | Freq: Every day | ORAL | Status: DC | PRN

## 2021-02-22 MED ORDER — PREDNISONE 20 MG PO TABS
40.0000 mg | ORAL_TABLET | Freq: Every day | ORAL | Status: DC
  Administered 2021-02-22: 40 mg via ORAL
  Filled 2021-02-22: qty 2

## 2021-02-22 MED ORDER — AZITHROMYCIN 500 MG PO TABS
500.0000 mg | ORAL_TABLET | Freq: Every day | ORAL | 0 refills | Status: AC
Start: 2021-02-23 — End: 2021-02-26

## 2021-02-22 MED ORDER — DILTIAZEM HCL ER COATED BEADS 180 MG PO CP24: 180.0000 mg | ORAL_CAPSULE | Freq: Every day | ORAL | 2 refills | Status: DC

## 2021-02-22 MED ORDER — DILTIAZEM HCL ER COATED BEADS 180 MG PO CP24
180.0000 mg | ORAL_CAPSULE | Freq: Every day | ORAL | Status: DC
  Administered 2021-02-22: 180 mg via ORAL
  Filled 2021-02-22: qty 1

## 2021-02-22 MED ORDER — APIXABAN 5 MG PO TABS: 5.0000 mg | ORAL_TABLET | Freq: Two times a day (BID) | ORAL | 0 refills | Status: DC

## 2021-02-22 MED ORDER — AMIODARONE HCL 400 MG PO TABS: 400.0000 mg | ORAL_TABLET | Freq: Two times a day (BID) | ORAL | 0 refills | Status: DC

## 2021-02-22 MED ORDER — PREDNISONE 20 MG PO TABS: 40.0000 mg | ORAL_TABLET | Freq: Every day | ORAL | 0 refills | Status: AC

## 2021-02-22 MED ORDER — AMIODARONE HCL 200 MG PO TABS
400.0000 mg | ORAL_TABLET | Freq: Two times a day (BID) | ORAL | Status: DC
  Administered 2021-02-22: 400 mg via ORAL
  Filled 2021-02-22: qty 2

## 2021-02-22 MED ORDER — GABAPENTIN 300 MG PO CAPS: 600.0000 mg | ORAL_CAPSULE | Freq: Two times a day (BID) | ORAL | 2 refills | Status: DC

## 2021-02-22 NOTE — Progress Notes (Signed)
Anne Wells to be D/C'd Home per MD order.  Discussed prescriptions and follow up appointments with the patient. Prescriptions electronically submitted. medication list explained in detail. Pt verbalized understanding. Eliquis coupon given to patient, as well as code 44 paperwork.  Allergies as of 02/22/2021       Reactions   Penicillin G Rash        Medication List     TAKE these medications    albuterol (2.5 MG/3ML) 0.083% nebulizer solution Commonly known as: PROVENTIL Inhale 3 mLs into the lungs every 6 (six) hours as needed.   amiodarone 400 MG tablet Commonly known as: PACERONE Take 1 tablet (400 mg total) by mouth 2 (two) times daily.   apixaban 5 MG Tabs tablet Commonly known as: ELIQUIS Take 1 tablet (5 mg total) by mouth 2 (two) times daily.   azithromycin 500 MG tablet Commonly known as: ZITHROMAX Take 1 tablet (500 mg total) by mouth daily for 3 days. Start taking on: February 23, 2021   cyclobenzaprine 5 MG tablet Commonly known as: FLEXERIL Take 5 mg by mouth 2 (two) times daily.   diltiazem 180 MG 24 hr capsule Commonly known as: CARDIZEM CD Take 1 capsule (180 mg total) by mouth daily. Start taking on: February 23, 2021   gabapentin 300 MG capsule Commonly known as: NEURONTIN Take 2 capsules (600 mg total) by mouth 2 (two) times daily.   meloxicam 7.5 MG tablet Commonly known as: MOBIC Take 1 tablet (7.5 mg total) by mouth daily as needed for pain. Home med What changed:  when to take this reasons to take this additional instructions   Multivitamin Adult (Minerals) Tabs Take 1 tablet by mouth daily.   omeprazole 20 MG capsule Commonly known as: PRILOSEC Take 20 mg by mouth daily.   predniSONE 20 MG tablet Commonly known as: DELTASONE Take 2 tablets (40 mg total) by mouth daily with breakfast for 3 days. Start taking on: February 23, 2021   Stiolto Respimat 2.5-2.5 MCG/ACT Aers Generic drug: Tiotropium Bromide-Olodaterol Inhale 2  Inhalers into the lungs daily at 12 noon.        Vitals:   02/22/21 1123 02/22/21 1228  BP:    Pulse:    Resp:    Temp:    SpO2: 98% 98%    Skin clean, dry and intact without evidence of skin break down, no evidence of skin tears noted. IV catheter discontinued intact. Site without signs and symptoms of complications. Dressing and pressure applied. Pt denies pain at this time. No complaints noted.  An After Visit Summary was printed and given to the patient. Patient escorted via [place], and D/C home via private auto.  [name]

## 2021-02-22 NOTE — Evaluation (Signed)
Physical Therapy Evaluation Patient Details Name: MIZUKI HOEL MRN: [id number] DOB: Nov 15, 1950 Today's Date: 02/22/2021  History of Present Illness  70 y.o. female with medical history significant of COPD, GERD, OSA not on CPAP, follow-up smoker, who presents with SOB and chest pain.  Clinical Impression  Pt showed good confidence and safety with mobility/ambulation and apart from O2/HR changes during prolonged ambulation (O2 down to 86% on room air, HR up to 120s) she had no real issues.  She was able to maintain community appropriate speed and safety with 330ft walk and ultimately showed ability to safely return home w/o HHPT.  Will maintain on caseload while admitted to continue f/u with cardiovascular responses to activity.      Recommendations for follow up therapy are one component of a multi-disciplinary discharge planning process, led by the attending physician.  Recommendations may be updated based on patient status, additional functional criteria and insurance authorization.  Follow Up Recommendations No PT follow up    Assistance Recommended at Discharge None  Functional Status Assessment Patient has had a recent decline in their functional status and demonstrates the ability to make significant improvements in function in a reasonable and predictable amount of time.  Equipment Recommendations  None recommended by PT    Recommendations for Other Services       Precautions / Restrictions Precautions Precautions: Fall Restrictions Weight Bearing Restrictions: No      Mobility  Bed Mobility Overal bed mobility: Independent                  Transfers Overall transfer level: Independent                      Ambulation/Gait Ambulation/Gait assistance: Modified independent (Device/Increase time) Gait Distance (Feet): 350 Feet Assistive device: None         General Gait Details: Pt with community appropriate, safe, confident ambulation.  She did  have some change in vitals with the prolonged effort: HR from 90s to as high as 122 (generally staying in the 100-115 range during ambulation) and O2 sats on room air dropping from mid 90s to as low as 86%.  both recovered to pre-activity levels within 1 minute of seated rest break  Stairs            Wheelchair Mobility    Modified Rankin (Stroke Patients Only)       Balance Overall balance assessment: Independent                                           Pertinent Vitals/Pain Pain Assessment: No/denies pain    Home Living Family/patient expects to be discharged to:: Private residence Living Arrangements: Spouse/significant other Available Help at Discharge: Available 24 hours/day;Family   Home Access: Stairs to enter   Entrance Stairs-Number of Steps: 1   Home Layout: One level Home Equipment: None      Prior Function Prior Level of Function : Independent/Modified Independent             Mobility Comments: Pt out of the home multiple times a week, husband ususally drives but she can/will when needed       Hand Dominance        Extremity/Trunk Assessment   Upper Extremity Assessment Upper Extremity Assessment: Overall WFL for tasks assessed    Lower Extremity Assessment Lower Extremity Assessment: Overall [facility]  for tasks assessed       Communication      Cognition Arousal/Alertness: Awake/alert Behavior During Therapy: [facility] for tasks assessed/performed Overall Cognitive Status: Within Functional Limits for tasks assessed                                          General Comments      Exercises     Assessment/Plan    PT Assessment Patient needs continued PT services  PT Problem List Decreased activity tolerance;Cardiopulmonary status limiting activity       PT Treatment Interventions Gait training;Functional mobility training;Therapeutic activities;Therapeutic exercise;Balance training    PT Goals  (Current goals can be found in the Care Plan section)  Acute Rehab PT Goals Patient Stated Goal: go home PT Goal Formulation: With patient Time For Goal Achievement: 03/08/21 Potential to Achieve Goals: Good    Frequency Min 2X/week   Barriers to discharge        Co-evaluation               AM-PAC PT "6 Clicks" Mobility  Outcome Measure Help needed turning from your back to your side while in a flat bed without using bedrails?: None Help needed moving from lying on your back to sitting on the side of a flat bed without using bedrails?: None Help needed moving to and from a bed to a chair (including a wheelchair)?: None Help needed standing up from a chair using your arms (e.g., wheelchair or bedside chair)?: None Help needed to walk in hospital room?: None Help needed climbing 3-5 steps with a railing? : None 6 Click Score: 24    End of Session Equipment Utilized During Treatment: Gait belt Activity Tolerance: Patient tolerated treatment well Patient left: in chair;with call bell/phone within reach;with family/visitor present Nurse Communication: Mobility status (vitals) PT Visit Diagnosis: Muscle weakness (generalized) (M62.81);Difficulty in walking, not elsewhere classified (R26.2)    Time: 0102-7253 PT Time Calculation (min) (ACUTE ONLY): 19 min   Charges:   PT Evaluation $PT Eval Low Complexity: 1 Low          Kreg Shropshire, DPT 02/22/2021, 1:36 PM

## 2021-02-22 NOTE — Discharge Summary (Signed)
Physician Discharge Summary   Anne Wells  female DOB: 11-Apr-1951  ZOX:[id number]  PCP: [facility] [place] date: 02/21/2021 Discharge date: 02/22/2021  Admitted From: home Disposition:  home Home Health: no need CODE STATUS: Full code  Discharge Instructions     Discharge instructions   Complete by: As directed    You were treated for COPD flare up, and now improved and breathing well on room air.  Please finish 3 more days of prednisone and azithromycin starting tomorrow 11/10 at home.  You were also treated for Afib with high heart rate.  Your cardiologist has started you on amiodarone and Cardizem to control your heart rate, and Eliquis blood thinner for stroke prevention.  Please follow up with Dr. Humphrey Rolls in the office on Monday next week on 14 November at 9 AM.   Dr. Enzo Bi [facility] Course:  For full details, please see H&P, progress notes, consult notes and ancillary notes.  Briefly,  Anne Wells is a 70 y.o. female with medical history significant of COPD, GERD, OSA not on CPAP, follow-up smoker, who presented with SOB and chest pain.  Patient was found to have new atrial fibrillation with RVR, heart rate up to 170s, Cardizem drip was started with improvement.  COPD exacerbation [facility]):  Patient has cough, shortness of breath, decreased air movement with wheezing on auscultation, negative infiltration on chest x-ray, clinically consistent with a COPD exacerbation. --Pt was started on IV solumedrol, azithromycin and scheduled DuoNeb on admission. --Pt improved quickly and was breathing well on room air the next day, and felt ready to go home.  Pt was discharged on 3 more days of prednisone and azithromycin.   New onset atrial fibrillation with RVR ([place]):  HR is up to 170s. CHA2DS2-VASc Score is 2, will need oral anticoagulation, but this is likely triggered by COPD exacerbation and electrolytes disturbance with hypokalemia  and hypomagnesemia. Dr. Humphrey Rolls of card was consulted, he recommended to start Eliquis.  --pt was started on dilt gtt on admission.  Cardio started pt on amiodarone gtt as well.  Pt was transitioned to and discharged on amiodarone 400 mg BID and Cardizem 180 mg daily by cardiology. --will follow up with cardio Dr. Humphrey Rolls as outpatient.   Chest pain and elevated troponin:  trop 18 --> 16. Clinically insignificant  Hypokalemia and hypomagnesemia: K=3.1 and Mg 1.0 -repleted both   GERD (gastroesophageal reflux disease) -Protonix   Fall at home, initial encounter:  CT head is negative for acute intracranial abnormalities -PT evaluated, pt has no further need for PT.   Pulmonary nodule:  CTA incidentally showed a 20 x 11 mm pulmonary nodule in the right upper lobe. -need to be followed by repeating CT in 3 month, or giving referral to pulmonologist for possible biopsy   Discharge Diagnoses:  Principal Problem:   COPD exacerbation ([place]) Active Problems:   Atrial fibrillation with RVR (HCC)   Chest pain   Hypokalemia   Hypomagnesemia   New onset atrial fibrillation (HCC)   Elevated troponin   GERD (gastroesophageal reflux disease)   Fall at home, initial encounter   Pulmonary nodule   30 Day Unplanned Readmission Risk Score    Flowsheet Row ED to Hosp-Admission (Current) from 02/21/2021 in [place] PCU  30 Day Unplanned Readmission Risk Score (%) 9.11 Filed at 02/22/2021 1200       This score is the patient's risk  of an unplanned readmission within 30 days of being discharged (0 -100%). The score is based on dignosis, age, lab data, medications, orders, and past utilization.   Low:  0-14.9   Medium: 15-21.9   High: 22-29.9   Extreme: 30 and above         Discharge Instructions:  Allergies as of 02/22/2021       Reactions   Penicillin G Rash        Medication List     TAKE these medications    albuterol (2.5 MG/3ML) 0.083% nebulizer  solution Commonly known as: PROVENTIL Inhale 3 mLs into the lungs every 6 (six) hours as needed.   amiodarone 400 MG tablet Commonly known as: PACERONE Take 1 tablet (400 mg total) by mouth 2 (two) times daily.   apixaban 5 MG Tabs tablet Commonly known as: ELIQUIS Take 1 tablet (5 mg total) by mouth 2 (two) times daily.   azithromycin 500 MG tablet Commonly known as: ZITHROMAX Take 1 tablet (500 mg total) by mouth daily for 3 days. Start taking on: February 23, 2021   cyclobenzaprine 5 MG tablet Commonly known as: FLEXERIL Take 5 mg by mouth 2 (two) times daily.   diltiazem 180 MG 24 hr capsule Commonly known as: CARDIZEM CD Take 1 capsule (180 mg total) by mouth daily. Start taking on: February 23, 2021   gabapentin 300 MG capsule Commonly known as: NEURONTIN Take 2 capsules (600 mg total) by mouth 2 (two) times daily.   meloxicam 7.5 MG tablet Commonly known as: MOBIC Take 1 tablet (7.5 mg total) by mouth daily as needed for pain. Home med What changed:  when to take this reasons to take this additional instructions   Multivitamin Adult (Minerals) Tabs Take 1 tablet by mouth daily.   omeprazole 20 MG capsule Commonly known as: PRILOSEC Take 20 mg by mouth daily.   predniSONE 20 MG tablet Commonly known as: DELTASONE Take 2 tablets (40 mg total) by mouth daily with breakfast for 3 days. Start taking on: February 23, 2021   Stiolto Respimat 2.5-2.5 MCG/ACT Aers Generic drug: Tiotropium Bromide-Olodaterol Inhale 2 Inhalers into the lungs daily at 12 noon.         Follow-up Information     Dionisio David, MD Follow up on 02/27/2021.   Specialty: Cardiology Why: follow-up in the office on Monday next week on 14 November at 9 AM Contact information: [place] [address] [phone]         [facility] [place] Follow up in 1 week(s).   Specialty: General Practice Contact information: [place] [address] [phone]                 Allergies  Allergen Reactions   Penicillin G Rash     The results of significant diagnostics from this hospitalization (including imaging, microbiology, ancillary and laboratory) are listed below for reference.   Consultations:   Procedures/Studies: CT HEAD WO CONTRAST (5MM)  Result Date: 02/21/2021 CLINICAL DATA:  Chest pain, dizziness, nausea and vomiting. Atrial fibrillation. EXAM: CT HEAD WITHOUT CONTRAST TECHNIQUE: Contiguous axial images were obtained from the base of the skull through the vertex without intravenous contrast. COMPARISON:  None. FINDINGS: Brain: The brainstem, cerebellum, cerebral peduncles, thalami, basal ganglia, basilar cisterns, and ventricular system appear within normal limits. No intracranial hemorrhage, mass lesion, or acute CVA. 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 Other: No  supplemental non-categorized findings. IMPRESSION: 1. No acute intracranial findings. 2. 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 Electronically Signed   By: Van Clines M.D.   On: 02/21/2021 09:47   CT Angio Chest Pulmonary Embolism (PE) W or WO Contrast  Result Date: 02/21/2021 CLINICAL DATA:  Chest pain and dizziness with nausea and vomiting for 2 days. EXAM: CT ANGIOGRAPHY CHEST WITH CONTRAST TECHNIQUE: Multidetector CT imaging of the chest was performed using the standard protocol during bolus administration of intravenous contrast. Multiplanar CT image reconstructions and MIPs were obtained to evaluate the vascular anatomy. CONTRAST:  60mL OMNIPAQUE IOHEXOL 350 MG/ML SOLN COMPARISON:  None. FINDINGS: Cardiovascular: Satisfactory opacification of the pulmonary arteries to the segmental level. No evidence of pulmonary embolism. Normal heart size. No pericardial effusion. Mediastinum/Nodes: Moderate sliding hiatal  hernia. No adenopathy or acute inflammation. Granulomatous type calcifications of right hilar lymph nodes Lungs/Pleura: Centrilobular, paraseptal, and panlobular emphysema at the apices. Bilobed nodule in the right upper lobe measuring 20 x 11 mm. No lobar consolidation, edema, effusion, or pneumothorax. Upper Abdomen: Hepatic steatosis. Musculoskeletal: No acute finding Review of the MIP images confirms the above findings. IMPRESSION: 1. 20 x 11 mm pulmonary nodule in the right upper lobe. Consider one of the following in 3 months for both low-risk and high-risk individuals: (a) repeat chest CT, (b) follow-up PET-CT, or (c) tissue sampling. This recommendation follows the consensus statement: Guidelines for Management of Incidental Pulmonary Nodules Detected on CT Images: From the Fleischner Society 2017; Radiology 2017; 284:228-243. 2. Negative for pulmonary embolism. 3. Aortic Atherosclerosis (ICD10-I70.0) and Emphysema (ICD10-J43.9). Electronically Signed   By: Jorje Guild M.D.   On: 02/21/2021 09:36   DG Chest Portable 1 View  Result Date: 02/21/2021 CLINICAL DATA:  COPD exacerbation.  New onset atrial fibrillation EXAM: PORTABLE CHEST 1 VIEW COMPARISON:  None. FINDINGS: Normal heart size and mediastinal contours. Generalized interstitial coarsening and generous lung volumes. Bleb at the right apex which has a radiographically thin wall. There is no edema, consolidation, effusion, or pneumothorax. IMPRESSION: COPD with right apical bleb.  No acute finding. Electronically Signed   By: Jorje Guild M.D.   On: 02/21/2021 04:16      Labs: BNP (last 3 results) Recent Labs    02/21/21 0339  BNP 193.7*   Basic Metabolic Panel: Recent Labs  Lab 02/21/21 0339 02/21/21 0623 02/22/21 0403  NA 143  --  137  K 3.1*  --  4.5  CL 101  --  98  CO2 26  --  28  GLUCOSE 74  --  144*  BUN <5*  --  13  CREATININE 0.54  --  0.79  CALCIUM 8.5*  --  9.0  MG 1.0*  --  1.7  PHOS  --  3.1  --     Liver Function Tests: Recent Labs  Lab 02/21/21 0339  AST 58*  ALT 57*  ALKPHOS 88  BILITOT 0.9  PROT 7.4  ALBUMIN 3.7   No results for input(s): LIPASE, AMYLASE in the last 168 hours. No results for input(s): AMMONIA in the last 168 hours. CBC: Recent Labs  Lab 02/21/21 0339  WBC 4.1  NEUTROABS 1.9  HGB 13.1  HCT 41.3  MCV 98.6  PLT 220   Cardiac Enzymes: No results for input(s): CKTOTAL, CKMB, CKMBINDEX, TROPONINI in the last 168 hours. BNP: Invalid input(s): POCBNP CBG: No results for input(s): GLUCAP in the last 168 hours. D-Dimer No results for input(s): DDIMER in the last 72  hours. Hgb A1c Recent Labs    02/21/21 1013  HGBA1C 5.3   Lipid Profile Recent Labs    02/22/21 0403  CHOL 355*  HDL 179  LDLCALC 166*  TRIG 51  CHOLHDL 2.0   Thyroid function studies Recent Labs    02/22/21 0403  TSH 1.158   Anemia work up No results for input(s): VITAMINB12, FOLATE, FERRITIN, TIBC, IRON, RETICCTPCT in the last 72 hours. Urinalysis No results found for: COLORURINE, APPEARANCEUR, [place], [place], GLUCOSEU, [place], [place], [place], PROTEINUR, UROBILINOGEN, NITRITE, LEUKOCYTESUR Sepsis Labs Invalid input(s): PROCALCITONIN,  WBC,  LACTICIDVEN Microbiology Recent Results (from the past 240 hour(s))  Resp Panel by RT-PCR (Flu A&B, Covid) Nasopharyngeal Swab     Status: None   Collection Time: 02/21/21  4:04 AM   Specimen: Nasopharyngeal Swab; Nasopharyngeal(NP) swabs in vial transport medium  Result Value Ref Range Status   SARS Coronavirus 2 by RT PCR NEGATIVE NEGATIVE Final    Comment: (NOTE) SARS-CoV-2 target nucleic acids are NOT DETECTED.  The SARS-CoV-2 RNA is generally detectable in upper respiratory specimens during the acute phase of infection. The lowest concentration of SARS-CoV-2 viral copies this assay can detect is 138 copies/mL. A negative result does not preclude SARS-Cov-2 infection and should not be used as the sole basis for  treatment or other patient management decisions. A negative result may occur with  improper specimen collection/handling, submission of specimen other than nasopharyngeal swab, presence of viral mutation(s) within the areas targeted by this assay, and inadequate number of viral copies(<138 copies/mL). A negative result must be combined with clinical observations, patient history, and epidemiological information. The expected result is Negative.  Fact Sheet for Patients:  EntrepreneurPulse.com.au  Fact Sheet for Healthcare Providers:  IncredibleEmployment.be  This test is no t yet approved or cleared by the [place] FDA and  has been authorized for detection and/or diagnosis of SARS-CoV-2 by FDA under an Emergency Use Authorization (EUA). This EUA will remain  in effect (meaning this test can be used) for the duration of the COVID-19 declaration under Section 564(b)(1) of the Act, 21 U.S.C.section 360bbb-3(b)(1), unless the authorization is terminated  or revoked sooner.       Influenza A by PCR NEGATIVE NEGATIVE Final   Influenza B by PCR NEGATIVE NEGATIVE Final    Comment: (NOTE) The Xpert Xpress SARS-CoV-2/FLU/RSV plus assay is intended as an aid in the diagnosis of influenza from Nasopharyngeal swab specimens and should not be used as a sole basis for treatment. Nasal washings and aspirates are unacceptable for Xpert Xpress SARS-CoV-2/FLU/RSV testing.  Fact Sheet for Patients: EntrepreneurPulse.com.au  Fact Sheet for Healthcare Providers: IncredibleEmployment.be  This test is not yet approved or cleared by the [place] FDA and has been authorized for detection and/or diagnosis of SARS-CoV-2 by FDA under an Emergency Use Authorization (EUA). This EUA will remain in effect (meaning this test can be used) for the duration of the COVID-19 declaration under Section 564(b)(1) of the Act, 21  U.S.C. section 360bbb-3(b)(1), unless the authorization is terminated or revoked.  Performed at [facility], [place], [place] [address]   Respiratory (~20 pathogens) panel by PCR     Status: None   Collection Time: 02/21/21  4:04 AM   Specimen: Nasopharyngeal Swab; Respiratory  Result Value Ref Range Status   Adenovirus NOT DETECTED NOT DETECTED Final   Coronavirus 229E NOT DETECTED NOT DETECTED Final    Comment: (NOTE) The Coronavirus on the Respiratory Panel, DOES NOT test for the  novel  Coronavirus (2019 nCoV)    Coronavirus HKU1 NOT DETECTED NOT DETECTED Final   Coronavirus NL63 NOT DETECTED NOT DETECTED Final   Coronavirus OC43 NOT DETECTED NOT DETECTED Final   Metapneumovirus NOT DETECTED NOT DETECTED Final   Rhinovirus / Enterovirus NOT DETECTED NOT DETECTED Final   Influenza A NOT DETECTED NOT DETECTED Final   Influenza B NOT DETECTED NOT DETECTED Final   Parainfluenza Virus 1 NOT DETECTED NOT DETECTED Final   Parainfluenza Virus 2 NOT DETECTED NOT DETECTED Final   Parainfluenza Virus 3 NOT DETECTED NOT DETECTED Final   Parainfluenza Virus 4 NOT DETECTED NOT DETECTED Final   Respiratory Syncytial Virus NOT DETECTED NOT DETECTED Final   Bordetella pertussis NOT DETECTED NOT DETECTED Final   Bordetella Parapertussis NOT DETECTED NOT DETECTED Final   Chlamydophila pneumoniae NOT DETECTED NOT DETECTED Final   Mycoplasma pneumoniae NOT DETECTED NOT DETECTED Final    Comment: Performed at [facility], [place] [address] [place], [place] [address]     Total time spend on discharging this patient, including the last patient exam, discussing the hospital stay, instructions for ongoing care as it relates to all pertinent caregivers, as well as preparing the medical discharge records, prescriptions, and/or referrals as applicable, is 40 minutes.    Enzo Bi, MD  [facility] 02/22/2021, 1:19 PM

## 2021-02-22 NOTE — Care Management CC44 (Signed)
Condition Code 44 Documentation Completed  Patient Details  Name: Anne Wells MRN: [id number] Date of Birth: 1951-02-03   Condition Code 44 given:  Yes Patient signature on Condition Code 44 notice:  Yes Documentation of 2 MD's agreement:  Yes Code 44 added to claim:  Yes    Gerrianne Scale Dellia Donnelly, LCSW 02/22/2021, 2:00 PM

## 2021-02-22 NOTE — Progress Notes (Signed)
SUBJECTIVE: No chest pain   Vitals:   02/22/21 0600 02/22/21 0632 02/22/21 0755 02/22/21 0810  BP:  (!) 154/91 (!) 154/94   Pulse:  86 87   Resp:   18   Temp:   97.6 F (36.4 C)   TempSrc:      SpO2:   96% 97%  Weight: 88 kg     Height:        Intake/Output Summary (Last 24 hours) at 02/22/2021 0908 Last data filed at 02/22/2021 0827 Gross per 24 hour  Intake 290 ml  Output 200 ml  Net 90 ml    LABS: Basic Metabolic Panel: Recent Labs    02/21/21 0339 02/21/21 0623 02/22/21 0403  NA 143  --  137  K 3.1*  --  4.5  CL 101  --  98  CO2 26  --  28  GLUCOSE 74  --  144*  BUN <5*  --  13  CREATININE 0.54  --  0.79  CALCIUM 8.5*  --  9.0  MG 1.0*  --  1.7  PHOS  --  3.1  --    Liver Function Tests: Recent Labs    02/21/21 0339  AST 58*  ALT 57*  ALKPHOS 88  BILITOT 0.9  PROT 7.4  ALBUMIN 3.7   No results for input(s): LIPASE, AMYLASE in the last 72 hours. CBC: Recent Labs    02/21/21 0339  WBC 4.1  NEUTROABS 1.9  HGB 13.1  HCT 41.3  MCV 98.6  PLT 220   Cardiac Enzymes: No results for input(s): CKTOTAL, CKMB, CKMBINDEX, TROPONINI in the last 72 hours. BNP: Invalid input(s): POCBNP D-Dimer: No results for input(s): DDIMER in the last 72 hours. Hemoglobin A1C: Recent Labs    02/21/21 1013  HGBA1C 5.3   Fasting Lipid Panel: Recent Labs    02/22/21 0403  CHOL 355*  HDL 179  LDLCALC 166*  TRIG 51  CHOLHDL 2.0   Thyroid Function Tests: Recent Labs    02/22/21 0403  TSH 1.158   Anemia Panel: No results for input(s): VITAMINB12, FOLATE, FERRITIN, TIBC, IRON, RETICCTPCT in the last 72 hours.   PHYSICAL EXAM General: Well developed, well nourished, in no acute distress HEENT:  Normocephalic and atramatic Neck:  No JVD.  Lungs: Clear bilaterally to auscultation and percussion. Heart: HRRR . Normal S1 and S2 without gallops or murmurs.  Abdomen: Bowel sounds are positive, abdomen soft and non-tender  Msk:  Back normal, normal gait.  Normal strength and tone for age. Extremities: No clubbing, cyanosis or edema.   Neuro: Alert and oriented X 3. Psych:  Good affect, responds appropriately  TELEMETRY: Sinus rhythm  ASSESSMENT AND PLAN: Status post episode of atrial fibrillation with rapid ventricular response rate.  Patient ruled out for myocardial infarction and is feeling much better.  Advise switching the patient to p.o. amiodarone 400 twice daily.  Patient can be discharged with follow-up in the office on Monday next week on 14 November at 9 AM.  Patient should go home on Eliquis also.  Both amiodarone and Cardizem drip can be discontinued with Cardizem being switched to p.o. also.  Principal Problem:   COPD exacerbation ([place]) Active Problems:   Atrial fibrillation with RVR (HCC)   Chest pain   Hypokalemia   Hypomagnesemia   New onset atrial fibrillation (HCC)   Elevated troponin   GERD (gastroesophageal reflux disease)   Fall at home, initial encounter   Pulmonary nodule    Zariana Strub A, MD,  [facility] 02/22/2021 9:08 AM

## 2021-02-22 NOTE — Plan of Care (Signed)

## 2021-04-15 ENCOUNTER — Encounter: Payer: Self-pay | Admitting: Emergency Medicine

## 2021-04-15 ENCOUNTER — Emergency Department
Admission: EM | Admit: 2021-04-15 | Discharge: 2021-04-16 | Disposition: A | Discharge status: Home / Self Care | Payer: Commercial Managed Care - HMO | Attending: Emergency Medicine | Admitting: Emergency Medicine

## 2021-04-15 ENCOUNTER — Other Ambulatory Visit: Payer: Self-pay

## 2021-04-15 ENCOUNTER — Emergency Department: Payer: Commercial Managed Care - HMO

## 2021-04-15 DIAGNOSIS — S0990XA Unspecified injury of head, initial encounter: Secondary | ICD-10-CM | POA: Diagnosis not present

## 2021-04-15 DIAGNOSIS — Z20822 Contact with and (suspected) exposure to covid-19: Secondary | ICD-10-CM | POA: Diagnosis not present

## 2021-04-15 DIAGNOSIS — I4891 Unspecified atrial fibrillation: Secondary | ICD-10-CM | POA: Diagnosis not present

## 2021-04-15 DIAGNOSIS — S8391XA Sprain of unspecified site of right knee, initial encounter: Secondary | ICD-10-CM | POA: Insufficient documentation

## 2021-04-15 DIAGNOSIS — J449 Chronic obstructive pulmonary disease, unspecified: Secondary | ICD-10-CM | POA: Insufficient documentation

## 2021-04-15 DIAGNOSIS — Z87891 Personal history of nicotine dependence: Secondary | ICD-10-CM | POA: Insufficient documentation

## 2021-04-15 DIAGNOSIS — Z7901 Long term (current) use of anticoagulants: Secondary | ICD-10-CM | POA: Diagnosis not present

## 2021-04-15 DIAGNOSIS — Z7951 Long term (current) use of inhaled steroids: Secondary | ICD-10-CM | POA: Diagnosis not present

## 2021-04-15 DIAGNOSIS — W19XXXA Unspecified fall, initial encounter: Secondary | ICD-10-CM | POA: Insufficient documentation

## 2021-04-15 DIAGNOSIS — S99911A Unspecified injury of right ankle, initial encounter: Secondary | ICD-10-CM | POA: Diagnosis present

## 2021-04-15 LAB — BASIC METABOLIC PANEL
Anion gap: 11 (ref 5–15)
BUN: 12 mg/dL (ref 8–23)
CO2: 27 mmol/L (ref 22–32)
Calcium: 9.4 mg/dL (ref 8.9–10.3)
Chloride: 104 mmol/L (ref 98–111)
Creatinine, Ser: 0.76 mg/dL (ref 0.44–1.00)
GFR, Estimated: >60 mL/min (ref >60)
Glucose, Bld: 77 mg/dL (ref 70–99)
Potassium: 3.6 mmol/L (ref 3.5–5.1)
Sodium: 142 mmol/L (ref 135–145)

## 2021-04-15 LAB — CBC
HCT: 40.3 % (ref 36.0–46.0)
Hemoglobin: 12.6 g/dL (ref 12.0–15.0)
MCH: 30.7 pg (ref 26.0–34.0)
MCHC: 31.3 g/dL (ref 30.0–36.0)
MCV: 98.3 fL (ref 80.0–100.0)
Platelets: 197 10*3/uL (ref 150–400)
RBC: 4.1 MIL/uL (ref 3.87–5.11)
RDW: 15.4 % (ref 11.5–15.5)
WBC: 5.1 10*3/uL (ref 4.0–10.5)
nRBC: 0 % (ref 0.0–0.2)

## 2021-04-15 LAB — CBG MONITORING, ED: Glucose-Capillary: 87 mg/dL (ref 70–99)

## 2021-04-15 LAB — RESP PANEL BY RT-PCR (FLU A&B, COVID) ARPGX2
Influenza A by PCR: NEGATIVE
Influenza B by PCR: NEGATIVE
SARS Coronavirus 2 by RT PCR: NEGATIVE

## 2021-04-15 MED ORDER — OXYCODONE-ACETAMINOPHEN 5-325 MG PO TABS
1.0000 | ORAL_TABLET | Freq: Once | ORAL | Status: AC
  Administered 2021-04-16: 1 via ORAL
  Filled 2021-04-15: qty 1

## 2021-04-15 NOTE — ED Triage Notes (Signed)
Pt via EMS from home. Pt c/o multiple falls today. States that she has been blacking out states that she has been increasingly weak. Unknown if she hit her head, but on blood thinner. Pt c/o L ankle and L hip pain. Denies any head or neck pain.

## 2021-04-15 NOTE — ED Notes (Signed)
Called lab for blood draw

## 2021-04-15 NOTE — ED Provider Notes (Signed)
[facility]  ____________________________________________   Event Date/Time   First MD Initiated Contact with Patient 04/15/21 2302     (approximate)  I have reviewed the triage vital signs and the nursing notes.   HISTORY  Chief Complaint Fall    HPI Anne Wells is a 70 y.o. female with past medical history of atrial fibrillation with RVR, COPD, GERD who presents after a fall.  Patient was about to feed her dog outside when she had a fall.  Patient does not remember feeling lightheaded or any prodromal symptoms.  She is not sure why she fell.  She really does not remember anything about how it happened.  He is not sure if she hit her head.  She is not sure if she lost consciousness.  She remembers being on the ground with right knee pain and was unable to get up on her own.  She has been having right knee pain and swelling since.  Has had difficulty ambulating.  She denies headache nausea vomiting neck pain chest pain.  She denies any recent illnesses including fevers chills.  Denies shortness of breath or chest pain.  Otherwise she feels back to baseline and her knee pain.         History reviewed. No pertinent past medical history.  Patient Active Problem List   Diagnosis Date Noted   Atrial fibrillation with RVR ([place]) 02/21/2021   COPD exacerbation ([place]) 02/21/2021   Chest pain 02/21/2021   Hypokalemia 02/21/2021   Hypomagnesemia 02/21/2021   New onset atrial fibrillation ([place]) 02/21/2021   Elevated troponin 02/21/2021   GERD (gastroesophageal reflux disease) 02/21/2021   Fall at home, initial encounter 02/21/2021   Pulmonary nodule 02/21/2021    Past Surgical History:  Procedure Laterality Date   Dental procedure      Prior to Admission medications   Medication Sig Start Date End Date Taking? Authorizing Provider  ACETAMINOPHEN 8 HOUR 650 MG CR tablet SMARTSIG:1-2 Tablet(s) By Mouth Every 8 Hours PRN 03/27/21  Yes [provider]  acyclovir (ZOVIRAX) 400 MG tablet Take 400 mg by mouth 2 (two) times daily. 03/27/21  Yes [provider]  albuterol (PROVENTIL) (2.5 MG/3ML) 0.083% nebulizer solution Inhale 3 mLs into the lungs every 6 (six) hours as needed. 09/23/20 09/23/21 Yes [provider]  amiodarone (PACERONE) 200 MG tablet Take by mouth 2 (two) times daily. 03/27/21  Yes [provider]  apixaban (ELIQUIS) 5 MG TABS tablet Take 1 tablet (5 mg total) by mouth 2 (two) times daily. 02/22/21 05/23/21 Yes Enzo Bi, MD  cyclobenzaprine (FLEXERIL) 5 MG tablet Take 5 mg by mouth 2 (two) times daily. 01/05/21  Yes [provider]  diltiazem (CARDIZEM CD) 180 MG 24 hr capsule Take 1 capsule (180 mg total) by mouth daily. 02/23/21 05/24/21 Yes Enzo Bi, MD  enalapril (VASOTEC) 10 MG tablet Take 10 mg by mouth daily. 04/11/21  Yes [provider]  gabapentin (NEURONTIN) 300 MG capsule Take 2 capsules (600 mg total) by mouth 2 (two) times daily. 02/22/21 05/23/21 Yes Enzo Bi, MD  meloxicam (MOBIC) 7.5 MG tablet Take 1 tablet (7.5 mg total) by mouth daily as needed for pain. Home med 02/22/21  Yes Enzo Bi, MD  Multiple Vitamins-Minerals (MULTIVITAMIN ADULT, MINERALS,) TABS Take 1 tablet by mouth daily.   Yes [provider]  omeprazole (PRILOSEC) 20 MG capsule Take 20 mg by mouth daily. 11/15/20  Yes [provider]  Tiotropium Bromide-Olodaterol (STIOLTO RESPIMAT) 2.5-2.5  MCG/ACT AERS Inhale 2 Inhalers into the lungs daily at 12 noon. 09/23/20  Yes [provider]    Allergies Penicillin g  Family History  Problem Relation Age of Onset   COPD Father    Bone cancer Brother    COPD Brother     Social History Social History   Tobacco Use   Smoking status: Former    Types: Cigarettes   Smokeless tobacco: Never  Substance Use Topics   Alcohol use: Yes   Drug use: Never    Review of Systems   Review of Systems  Constitutional:  Negative for chills and  fever.  Respiratory:  Negative for shortness of breath.   Cardiovascular:  Negative for chest pain, palpitations and leg swelling.  Gastrointestinal:  Negative for abdominal pain, nausea and vomiting.  Musculoskeletal:  Positive for arthralgias and myalgias.  All other systems reviewed and are negative.  Physical Exam Updated Vital Signs BP 136/78 (BP Location: Right Arm)    Pulse 74    Temp 98 F (36.7 C) (Oral)    Resp 19    Ht 5\' 8"  (1.727 m)    Wt 87.5 kg    SpO2 95%    BMI 29.35 kg/m   Physical Exam Vitals and nursing note reviewed.  Constitutional:      General: She is not in acute distress.    Appearance: Normal appearance.  HENT:     Head: Normocephalic and atraumatic.  Eyes:     General: No scleral icterus.    Conjunctiva/sclera: Conjunctivae normal.  Pulmonary:     Effort: Pulmonary effort is normal. No respiratory distress.     Breath sounds: No stridor.  Abdominal:     General: Abdomen is flat. There is no distension.     Palpations: Abdomen is soft.     Tenderness: There is no abdominal tenderness. There is no guarding.  Musculoskeletal:        General: No deformity or signs of injury.     Cervical back: Normal range of motion.     Comments: Right knee with tenderness to palpation and a moderate knee effusion, there is an abrasion over the patella, able to range but with significant pain Lower extremity compartments are soft Pelvis is stable nontender no focal tenderness or swelling of the thigh, ankles and feet  Skin:    General: Skin is dry.     Coloration: Skin is not jaundiced or pale.  Neurological:     General: No focal deficit present.     Mental Status: She is alert and oriented to person, place, and time. Mental status is at baseline.  Psychiatric:        Mood and Affect: Mood normal.        Behavior: Behavior normal.     LABS (all labs ordered are listed, but only abnormal results are displayed)  Labs Reviewed  RESP PANEL BY RT-PCR (FLU A&B,  COVID) ARPGX2  BASIC METABOLIC PANEL  CBC  URINALYSIS, ROUTINE W REFLEX MICROSCOPIC  CBG MONITORING, ED   ____________________________________________  EKG  NSR, nml axis, nml intervals, diffuse nonspecific T wave flattening no acute ischemic changes  ____________________________________________  RADIOLOGY Almeta Monas, personally viewed and evaluated these images (plain radiographs) as part of my medical decision making, as well as reviewing the written report by the radiologist.  ED MD interpretation:  I reviewed the CXR which does not show any acute cardiopulmonary process      ____________________________________________   PROCEDURES  Procedure(s) performed (including Critical Care):  Procedures   ____________________________________________   INITIAL IMPRESSION / ASSESSMENT AND PLAN / ED COURSE     The patient is a 70 year old female who presents after a fall today with right knee pain.  Unclear exactly what happened or if this was syncope.  She does not remember most of the fall does not remember any prodrome or if she lost consciousness but does remember being on the ground unable to get up.  She complains of right knee pain and swelling.  Otherwise has no chest pain shortness of breath or other ischemic symptoms.  She has not been sick and has not had any lightheadedness or other syncope today.  On exam she has an obvious right knee effusion and has pain with palpation and range.  Otherwise she has no signs of trauma.  CT head and C-spine are negative.  Chest x-ray does not have any acute findings.  X-ray of the pelvis and ankle were also obtained which did not have an acute findings.  X-ray of the knee shows an effusion but no acute fracture.  Patient was not able to bear weight so obtain a CT to rule out occult fracture which does show effusion and drift of the patella but no tibial plateau fracture.  Will place patient in knee immobilizer and crutches.  She  will need orthopedic follow-up.      ____________________________________________   FINAL CLINICAL IMPRESSION(S) / ED DIAGNOSES  Final diagnoses:  Sprain of right knee, unspecified ligament, initial encounter     ED Discharge Orders     None        Note:  This document was prepared using Dragon voice recognition software and may include unintentional dictation errors.    Rada Hay, MD 04/16/21 [phone]

## 2021-04-15 NOTE — ED Notes (Signed)
Patient assisted to use commode in room.  C/o severe pain and swelling to R knee.  Unable to bear weight at this time

## 2021-04-15 NOTE — ED Provider Notes (Signed)
Emergency Medicine Provider Triage Evaluation Note  Anne Wells , a 70 y.o. female  was evaluated in triage.  Pt complains of 3 falls today.  Patient's had weakness.  Was hospitalized about a month ago for atrial fibrillation.  Started on blood thinner.  When she had a syncopal episode she is unsure if she hit her head.  Her husband states he found her on the ground when he got home she was unable to get up.  Has not noticed any strokelike symptoms.  No slurred speech.  She does have weakness.  Review of Systems  Positive: Weakness, multiple falls Negative: Chest pain, shortness of breath  Physical Exam  BP (!) 148/98 (BP Location: Right Arm)    Pulse 66    Temp 98 F (36.7 C) (Oral)    Resp 20    Ht 5\' 8"  (1.727 m)    Wt 87.5 kg    SpO2 92%    BMI 29.35 kg/m  Gen:   Awake, no distress   Resp:  Normal effort  MSK:   Moves extremities without difficulty, left ankle tender Other:    Medical Decision Making  Medically screening exam initiated at 5:13 PM.  Appropriate orders placed.  [name] was informed that the remainder of the evaluation will be completed by another provider, this initial triage assessment does not replace that evaluation, and the importance of remaining in the ED until their evaluation is complete.  Husband states patient has complained about her hip hurting, complaint left ankle pain.  Will do imaging of the left ankle and pelvis.  Imaging CT of the head and neck.   Dinia Starks, PA-C 04/15/21 1717    Naaman Plummer, MD 04/21/21 [phone]

## 2021-04-16 ENCOUNTER — Emergency Department: Payer: Commercial Managed Care - HMO

## 2021-04-16 MED ORDER — OXYCODONE HCL 5 MG PO TABS
5.0000 mg | ORAL_TABLET | Freq: Three times a day (TID) | ORAL | 0 refills | Status: DC | PRN
Start: 2021-04-16 — End: 2021-07-30

## 2021-04-16 NOTE — Discharge Instructions (Addendum)
Your x-ray and CAT scan does not show any broken bones but you likely tore some of the ligaments in the knee which is causing the pain and swelling.  Use the knee immobilizer and you can use crutches for pain.  You can take oxycodone for breakthrough pain and Tylenol.

## 2021-04-16 NOTE — ED Notes (Signed)
Patient requesting a walker instead of crutches.  MD aware and patient given a walker with wheels

## 2021-04-16 NOTE — ED Notes (Signed)
Patient currently in CT

## 2021-04-29 ENCOUNTER — Emergency Department
Admission: EM | Admit: 2021-04-29 | Discharge: 2021-04-29 | Disposition: A | Discharge status: Home / Self Care | Payer: Medicare Other | Attending: Emergency Medicine | Admitting: Emergency Medicine

## 2021-04-29 ENCOUNTER — Emergency Department: Payer: Medicare Other

## 2021-04-29 ENCOUNTER — Other Ambulatory Visit: Payer: Self-pay

## 2021-04-29 ENCOUNTER — Encounter: Payer: Self-pay | Admitting: Emergency Medicine

## 2021-04-29 DIAGNOSIS — R509 Fever, unspecified: Secondary | ICD-10-CM

## 2021-04-29 DIAGNOSIS — N390 Urinary tract infection, site not specified: Secondary | ICD-10-CM

## 2021-04-29 DIAGNOSIS — U071 COVID-19: Secondary | ICD-10-CM

## 2021-04-29 LAB — CBC WITH DIFFERENTIAL/PLATELET
Abs Immature Granulocytes: 0.02 10*3/uL (ref 0.00–0.07)
Basophils Absolute: 0 10*3/uL (ref 0.0–0.1)
Basophils Relative: 0 %
Eosinophils Absolute: 0 10*3/uL (ref 0.0–0.5)
Eosinophils Relative: 0 %
HCT: 36.6 % (ref 36.0–46.0)
Hemoglobin: 11.6 g/dL — ABNORMAL LOW (ref 12.0–15.0)
Immature Granulocytes: 1 %
Lymphocytes Relative: 12 %
Lymphs Abs: 0.5 10*3/uL — ABNORMAL LOW (ref 0.7–4.0)
MCH: 30.7 pg (ref 26.0–34.0)
MCHC: 31.7 g/dL (ref 30.0–36.0)
MCV: 96.8 fL (ref 80.0–100.0)
Monocytes Absolute: 0.6 10*3/uL (ref 0.1–1.0)
Monocytes Relative: 14 %
Neutro Abs: 3 10*3/uL (ref 1.7–7.7)
Neutrophils Relative %: 73 %
Platelets: 151 10*3/uL (ref 150–400)
RBC: 3.78 MIL/uL — ABNORMAL LOW (ref 3.87–5.11)
RDW: 15.1 % (ref 11.5–15.5)
WBC: 4 10*3/uL (ref 4.0–10.5)
nRBC: 0 % (ref 0.0–0.2)

## 2021-04-29 LAB — COMPREHENSIVE METABOLIC PANEL
ALT: 39 U/L (ref 0–44)
AST: 52 U/L — ABNORMAL HIGH (ref 15–41)
Albumin: 3.9 g/dL (ref 3.5–5.0)
Alkaline Phosphatase: 80 U/L (ref 38–126)
Anion gap: 8 (ref 5–15)
BUN: 10 mg/dL (ref 8–23)
CO2: 27 mmol/L (ref 22–32)
Calcium: 9.1 mg/dL (ref 8.9–10.3)
Chloride: 96 mmol/L — ABNORMAL LOW (ref 98–111)
Creatinine, Ser: 0.81 mg/dL (ref 0.44–1.00)
GFR, Estimated: >60 mL/min (ref >60)
Glucose, Bld: 102 mg/dL — ABNORMAL HIGH (ref 70–99)
Potassium: 3.7 mmol/L (ref 3.5–5.1)
Sodium: 131 mmol/L — ABNORMAL LOW (ref 135–145)
Total Bilirubin: 0.4 mg/dL (ref 0.3–1.2)
Total Protein: 7.5 g/dL (ref 6.5–8.1)

## 2021-04-29 LAB — RESP PANEL BY RT-PCR (FLU A&B, COVID) ARPGX2
Influenza A by PCR: NEGATIVE
Influenza B by PCR: NEGATIVE
SARS Coronavirus 2 by RT PCR: POSITIVE — AB

## 2021-04-29 LAB — URINALYSIS, ROUTINE W REFLEX MICROSCOPIC
Bilirubin Urine: NEGATIVE
Glucose, UA: NEGATIVE mg/dL
Ketones, ur: NEGATIVE mg/dL
Nitrite: NEGATIVE
Protein, ur: NEGATIVE mg/dL
Specific Gravity, Urine: <1.005 — ABNORMAL LOW (ref 1.005–1.030)
pH: 6 (ref 5.0–8.0)

## 2021-04-29 MED ORDER — CEPHALEXIN 500 MG PO CAPS
500.0000 mg | ORAL_CAPSULE | Freq: Three times a day (TID) | ORAL | 0 refills | Status: AC
Start: 2021-04-29 — End: 2021-05-09

## 2021-04-29 MED ORDER — NIRMATRELVIR/RITONAVIR (PAXLOVID)TABLET
3.0000 | ORAL_TABLET | Freq: Two times a day (BID) | ORAL | Status: DC
  Filled 2021-04-29: qty 30

## 2021-04-29 MED ORDER — ACETAMINOPHEN 325 MG PO TABS: 650.0000 mg | ORAL_TABLET | Freq: Once | ORAL | Status: AC

## 2021-04-29 MED ORDER — ACETAMINOPHEN 325 MG PO TABS
ORAL_TABLET | ORAL | Status: AC
  Administered 2021-04-29: 650 mg via ORAL
  Filled 2021-04-29: qty 2

## 2021-04-29 MED ORDER — NIRMATRELVIR/RITONAVIR (PAXLOVID)TABLET: 3.0000 | ORAL_TABLET | Freq: Two times a day (BID) | ORAL | 0 refills | Status: DC

## 2021-04-29 NOTE — ED Notes (Signed)
E sig pad not working. Verbal consent for discharge obtained.

## 2021-04-29 NOTE — ED Triage Notes (Signed)
C/O productive cough for white sputum, body aches, fever x 3 days.  States fever is 105 today, has not taken any antipyretic.  AOx3.  NAD.  MAE

## 2021-04-29 NOTE — Discharge Instructions (Addendum)
You have COVID and a mild UTI.  I will give you Keflex 1 pill 3 tims a day to treat the uti. I will give you paxlovid to treat the COVID. Please return for any worsening of symptoms including shortness of breath or high fever or feeling sicker. Quarantine at home for 5 days. If you are feeling OK after that you can go out with a N95 mask for 5 days then return to normal activities.   Do not take more tylenol than recommended by the manufacturer.

## 2021-04-29 NOTE — ED Notes (Signed)
Pt to ED see triage note has been coughing for 3-4 days, started feeling sick today. Coarse crackles heard over R lower lung fields, otherwise diminished lung sounds, prior smoker.

## 2021-04-29 NOTE — ED Provider Notes (Addendum)
[facility] Provider Note    Event Date/Time   First MD Initiated Contact with Patient 04/29/21 1448     (approximate)   History   Fever   HPI { Anne Wells is a 71 y.o. female who complains of a cough productive of some white phlegm and achiness all over for the last 3 days.  Today she had a fever up to 105 by forehead thermometer.  Here temperatures only 100.4.  Patient is not short of breath.  She is able to ambulate without any difficulty.  Patient does not remember for sure if she has any allergies.  She cannot tell me her past medical history or her medications.  There is nothing in the computer unfortunately.  She goes to [facility].   Physical Exam   Triage Vital Signs: ED Triage Vitals  Enc Vitals Group     BP 04/29/21 1354 (!) 154/87     Pulse Rate 04/29/21 1354 73     Resp 04/29/21 1354 18     Temp 04/29/21 1354 (!) 100.4 F (38 C)     Temp Source 04/29/21 1354 Oral     SpO2 04/29/21 1354 95 %     Weight 04/29/21 1311 220 lb (99.8 kg)     Height 04/29/21 1311 5\' 8"  (1.727 m)     Head Circumference --      Peak Flow --      Pain Score 04/29/21 1311 0     Pain Loc --      Pain Edu? --      Excl. in [place]? --     Most recent vital signs: Vitals:   04/29/21 1354 04/29/21 1504  BP: (!) 154/87 136/87  Pulse: 73 72  Resp: 18 20  Temp: (!) 100.4 F (38 C)   SpO2: 95% 97%     General: Awake, no distress.  CV:  Good peripheral perfusion.  Heart regular rate and rhythm no audible murmurs Resp:  Normal effort.  No respiratory distress patient has scattered crackles throughout the lower lung fields and patchy areas on both sides. Abd:  No distention.  No tenderness Other:  No noticeable edema.   ED Results / Procedures / Treatments   Labs (all labs ordered are listed, but only abnormal results are displayed) Labs Reviewed  RESP PANEL BY RT-PCR (FLU A&B, COVID) ARPGX2 - Abnormal; Notable for the following  components:      Result Value   SARS Coronavirus 2 by RT PCR POSITIVE (*)    All other components within normal limits  URINALYSIS, ROUTINE W REFLEX MICROSCOPIC - Abnormal; Notable for the following components:   Specific Gravity, Urine <1.005 (*)    Hgb urine dipstick TRACE (*)    Leukocytes,Ua SMALL (*)    Bacteria, UA MANY (*)    All other components within normal limits  COMPREHENSIVE METABOLIC PANEL - Abnormal; Notable for the following components:   Sodium 131 (*)    Chloride 96 (*)    Glucose, Bld 102 (*)    AST 52 (*)    All other components within normal limits  CBC WITH DIFFERENTIAL/PLATELET - Abnormal; Notable for the following components:   RBC 3.78 (*)    Hemoglobin 11.6 (*)    Lymphs Abs 0.5 (*)    All other components within normal limits     EKG     RADIOLOGY Chest x-ray read by radiology reviewed by me shows some patchy infiltrates felt by the  radiologist to be consistent with a viral or atypical pneumonia.  I agree with this.   PROCEDURES:  Critical Care performed:   Procedures   MEDICATIONS ORDERED IN ED: Medications  nirmatrelvir/ritonavir EUA (PAXLOVID) 3 tablet (has no administration in time range)  acetaminophen (TYLENOL) tablet 650 mg (650 mg Oral Given 04/29/21 1401)     IMPRESSION / MDM / ASSESSMENT AND PLAN / ED COURSE  I reviewed the triage vital signs and the nursing notes.                              Differential diagnosis includes, but is not limited to atypical pneumonia such as legionnaires disease.  COVID or other viral pneumonia.  Patient could have another inflammatory but not infectious process as well.  Fever mitigates against that and more for a infectious process. I did review the chest x-ray.  It does look consistent with COVID.  Patient's lab test is positive for COVID.  She was ambulated here and is not hypoxic.  Patient is older and slightly obese therefore if her GFR comes back okay I will give her some paxlovid.  I  will also give her some Keflex for her UTI just in case.  I have reviewed the chest x-ray and all of her lab work.  The only thing that looks worrisome is of course the COVID test and then the UTI.  Her liver functions are minimally elevated.  She does not have any belly pain I do not anticipate any problems from this.  There are no other medical records in the computer that I can review.  Patient's husband will help her and keep an eye on her and return for any worsening at all including and especially shortness of breath.  Pharmacy will give her the paxlovid here and we will let her go.  I will warn her not to take too much Tylenol.  Patient confirms that she is not short of breath with exertion and her oxygen numbers did not go down when we walked her here.   FINAL CLINICAL IMPRESSION(S) / ED DIAGNOSES   Final diagnoses:  Fever, unspecified fever cause  Urinary tract infection without hematuria, site unspecified  COVID     Rx / DC Orders   ED Discharge Orders          Ordered    nirmatrelvir/ritonavir EUA (PAXLOVID) 20 x 150 MG & 10 x 100MG  TABS  2 times daily,   Status:  Discontinued        04/29/21 1559    cephALEXin (KEFLEX) 500 MG capsule  3 times daily        04/29/21 1601             Note:  This document was prepared using Dragon voice recognition software and may include unintentional dictation errors.   Nena Polio, MD 04/29/21 1606    Nena Polio, MD 04/29/21 1620

## 2021-04-29 NOTE — ED Provider Triage Note (Signed)
Emergency Medicine Provider Triage Evaluation Note  Anne Wells, a 71 y.o. female  was evaluated in triage.  Pt complains of productive cough, fevers, and cold chills.  She reports subjective fevers and body aches x3 days.  Her husband reports she had a T-max of 84 F today prior to arrival.  No medications been given in the interim.  Patient gives remote history of COVID and pneumonia.  Review of Systems  Positive: FCS, prod cough Negative: NV, SOB  Physical Exam  BP (!) 154/87 (BP Location: Left Arm)    Pulse 73    Temp (!) 100.4 F (38 C) (Oral)    Resp 18    Ht 5\' 8"  (1.727 m)    Wt 99.8 kg    SpO2 95%    BMI 33.45 kg/m  Gen:   Awake, no distress   Resp:  Normal effort rhonchi noted MSK:   Moves extremities without difficulty  Other:  CVS: RRR  Medical Decision Making  Medically screening exam initiated at 1:59 PM.  Appropriate orders placed.  Anne Wells was informed that the remainder of the evaluation will be completed by another provider, this initial triage assessment does not replace that evaluation, and the importance of remaining in the ED until their evaluation is complete.  Geriatric patient ED evaluation of subjective fevers, productive cough, and body aches for 3 days.   Anne Needles, PA-C 04/29/21 1401

## 2021-04-29 NOTE — ED Notes (Signed)
Paxlovid brought form pharmacy and provided to pt before discharge.

## 2021-05-01 ENCOUNTER — Encounter: Payer: Self-pay | Admitting: Emergency Medicine

## 2021-05-09 ENCOUNTER — Other Ambulatory Visit: Payer: Self-pay | Admitting: Family Medicine

## 2021-05-09 DIAGNOSIS — R911 Solitary pulmonary nodule: Secondary | ICD-10-CM

## 2021-05-25 ENCOUNTER — Ambulatory Visit
Admission: RE | Admit: 2021-05-25 | Discharge: 2021-05-25 | Disposition: A | Discharge status: Home / Self Care | Payer: Medicare Other | Source: Ambulatory Visit | Attending: Family Medicine | Admitting: Family Medicine

## 2021-05-25 ENCOUNTER — Other Ambulatory Visit: Payer: Self-pay

## 2021-05-25 DIAGNOSIS — R911 Solitary pulmonary nodule: Secondary | ICD-10-CM | POA: Diagnosis not present

## 2021-06-08 ENCOUNTER — Telehealth: Payer: Self-pay

## 2021-06-08 NOTE — Telephone Encounter (Signed)
Called to confirm that patient knows where to go and to confirm her appointment and if she had any questions. Pt understood and confirmed location and time.

## 2021-06-09 ENCOUNTER — Other Ambulatory Visit: Payer: Self-pay

## 2021-06-09 ENCOUNTER — Inpatient Hospital Stay: Payer: Medicare Other | Attending: Internal Medicine | Admitting: Internal Medicine

## 2021-06-09 ENCOUNTER — Encounter (INDEPENDENT_AMBULATORY_CARE_PROVIDER_SITE_OTHER): Payer: Self-pay

## 2021-06-09 ENCOUNTER — Encounter: Payer: Self-pay | Admitting: Internal Medicine

## 2021-06-09 ENCOUNTER — Inpatient Hospital Stay: Payer: Medicare Other

## 2021-06-09 DIAGNOSIS — R911 Solitary pulmonary nodule: Secondary | ICD-10-CM | POA: Diagnosis not present

## 2021-06-09 DIAGNOSIS — K449 Diaphragmatic hernia without obstruction or gangrene: Secondary | ICD-10-CM | POA: Insufficient documentation

## 2021-06-09 DIAGNOSIS — Z7901 Long term (current) use of anticoagulants: Secondary | ICD-10-CM | POA: Insufficient documentation

## 2021-06-09 DIAGNOSIS — I3139 Other pericardial effusion (noninflammatory): Secondary | ICD-10-CM | POA: Diagnosis not present

## 2021-06-09 DIAGNOSIS — J449 Chronic obstructive pulmonary disease, unspecified: Secondary | ICD-10-CM | POA: Diagnosis not present

## 2021-06-09 DIAGNOSIS — I2721 Secondary pulmonary arterial hypertension: Secondary | ICD-10-CM | POA: Diagnosis not present

## 2021-06-09 DIAGNOSIS — I4891 Unspecified atrial fibrillation: Secondary | ICD-10-CM | POA: Insufficient documentation

## 2021-06-09 DIAGNOSIS — Z87891 Personal history of nicotine dependence: Secondary | ICD-10-CM | POA: Diagnosis not present

## 2021-06-09 NOTE — Progress Notes (Signed)
[place] NOTE  Patient Care Team: Donnie Coffin, MD as PCP - General (Family Medicine) Center, [address] (General Practice)  CHIEF COMPLAINTS/PURPOSE OF CONSULTATION: lung nodule  # IFEB 2023- There is 2.2 x 1.3 cm bilobed noncalcified nodule in the right upper lobe which has increased in size. Possibility of neoplastic process is not excluded. Follow-up PET-CT and biopsy as warranted should be considered. There are linear densities in the posterior segment of right upper lobe and in the left lower lobe suggesting scarring or subsegmental atelectasis.   No significant lymphadenopathy is seen in the mediastinum.   Pulmonary arterial hypertension. Moderate sized fixed hiatal hernia. Small pericardial effusion.  # COPD  A.fib -Eliquis/amiodarone [Dr.Khan, cards- ]; HTN; Hx of vertigo/Hx of falls    Oncology History   No history exists.     HISTORY OF PRESENTING ILLNESS: Ambulating independently.  Accompanied by her daughter. [name] 71 y.o.  female history of smoking [quit 2006] is here for further evaluation and recommendations for lung mass/nodule.  Patient was recently admitted to hospital end of November 2022-for COPD exacerbation; new onset A-fib with RVR.  Patient was CT scan noted to have lung nodule right upper lobe; recommended outpatient follow-up.  Patient was again reevaluated in the emergency room in February 2023-noted to have slight progression/increase in size of the right upper lobe lung nodule.  She has been referred to oncology for further evaluation.   Review of Systems  Constitutional:  Negative for chills, diaphoresis, fever, malaise/fatigue and weight loss.  HENT:  Negative for nosebleeds and sore throat.   Eyes:  Negative for double vision.  Respiratory:  Positive for cough, sputum production and shortness of breath. Negative for hemoptysis and wheezing.   Cardiovascular:  Negative for chest pain,  palpitations, orthopnea and leg swelling.  Gastrointestinal:  Negative for abdominal pain, blood in stool, constipation, diarrhea, heartburn, melena, nausea and vomiting.  Genitourinary:  Negative for dysuria, frequency and urgency.  Musculoskeletal:  Positive for back pain and joint pain.  Skin: Negative.  Negative for itching and rash.  Neurological:  Negative for dizziness, tingling, focal weakness, weakness and headaches.  Endo/Heme/Allergies:  Does not bruise/bleed easily.  Psychiatric/Behavioral:  Negative for depression. The patient is not nervous/anxious and does not have insomnia.     MEDICAL HISTORY:  Past Medical History:  Diagnosis Date   COPD (chronic obstructive pulmonary disease) ([place])     SURGICAL HISTORY: Past Surgical History:  Procedure Laterality Date   Dental procedure      SOCIAL HISTORY: Social History   Socioeconomic History   Marital status: Married    Spouse name: Not on file   Number of children: Not on file   Years of education: Not on file   Highest education level: Not on file  Occupational History   Not on file  Tobacco Use   Smoking status: Former    Types: Cigarettes    Quit date: 04/16/2004    Years since quitting: 17.1   Smokeless tobacco: Never  Vaping Use   Vaping Use: Never used  Substance and Sexual Activity   Alcohol use: Yes   Drug use: Never   Sexual activity: Not Currently  Other Topics Concern   Not on file  Social History Narrative   Hx of smoking; quit in 2006. Live [place]; by self; daughter in [place].  Liquor once a week. Used to be housekeeping/retd- disability.    Social Determinants of Health  Financial Resource Strain: Not on file  Food Insecurity: Not on file  Transportation Needs: Not on file  Physical Activity: Not on file  Stress: Not on file  Social Connections: Not on file  Intimate Partner Violence: Not on file    FAMILY HISTORY: Family History  Problem Relation Age of Onset   Bone cancer Mother     COPD Father    Bone cancer Brother    COPD Brother     ALLERGIES:  is allergic to shellfish allergy and penicillin g.  MEDICATIONS:  Current Outpatient Medications  Medication Sig Dispense Refill   ACETAMINOPHEN 8 HOUR 650 MG CR tablet SMARTSIG:1-2 Tablet(s) By Mouth Every 8 Hours PRN     acyclovir (ZOVIRAX) 400 MG tablet Take 400 mg by mouth 2 (two) times daily.     albuterol (PROVENTIL) (2.5 MG/3ML) 0.083% nebulizer solution Inhale 3 mLs into the lungs every 6 (six) hours as needed.     amiodarone (PACERONE) 200 MG tablet Take by mouth 2 (two) times daily.     cyclobenzaprine (FLEXERIL) 5 MG tablet Take 5 mg by mouth 2 (two) times daily.     enalapril (VASOTEC) 10 MG tablet Take 10 mg by mouth daily.     meloxicam (MOBIC) 7.5 MG tablet Take 1 tablet (7.5 mg total) by mouth daily as needed for pain. Home med     Multiple Vitamins-Minerals (MULTIVITAMIN ADULT, MINERALS,) TABS Take 1 tablet by mouth daily.     omeprazole (PRILOSEC) 20 MG capsule Take 20 mg by mouth daily.     oxyCODONE (ROXICODONE) 5 MG immediate release tablet Take 1 tablet (5 mg total) by mouth every 8 (eight) hours as needed. 20 tablet 0   Tiotropium Bromide-Olodaterol (STIOLTO RESPIMAT) 2.5-2.5 MCG/ACT AERS Inhale 2 Inhalers into the lungs daily at 12 noon.     apixaban (ELIQUIS) 5 MG TABS tablet Take 1 tablet (5 mg total) by mouth 2 (two) times daily. 180 tablet 0   diltiazem (CARDIZEM CD) 180 MG 24 hr capsule Take 1 capsule (180 mg total) by mouth daily. 30 capsule 2   gabapentin (NEURONTIN) 300 MG capsule Take 2 capsules (600 mg total) by mouth 2 (two) times daily. 120 capsule 2   No current facility-administered medications for this visit.      Marland Kitchen  PHYSICAL EXAMINATION: ECOG PERFORMANCE STATUS: 1 - Symptomatic but completely ambulatory  Vitals:   06/09/21 0923  BP: (!) 153/77  Pulse: 70  Temp: 98.1 F (36.7 C)  SpO2: 98%   Filed Weights   06/09/21 0923  Weight: 207 lb 6.4 oz (94.1 kg)     Physical Exam Vitals and nursing note reviewed.  HENT:     Head: Normocephalic and atraumatic.     Mouth/Throat:     Pharynx: Oropharynx is clear.  Eyes:     Extraocular Movements: Extraocular movements intact.     Pupils: Pupils are equal, round, and reactive to light.  Cardiovascular:     Rate and Rhythm: Normal rate and regular rhythm.  Pulmonary:     Comments: Decreased breath sounds bilaterally.  Abdominal:     Palpations: Abdomen is soft.  Musculoskeletal:        General: Normal range of motion.     Cervical back: Normal range of motion.  Skin:    General: Skin is warm.  Neurological:     General: No focal deficit present.     Mental Status: She is alert and oriented to person, place, and time.  Psychiatric:  Behavior: Behavior normal.        Judgment: Judgment normal.     LABORATORY DATA:  I have reviewed the data as listed Lab Results  Component Value Date   WBC 4.0 04/29/2021   HGB 11.6 (L) 04/29/2021   HCT 36.6 04/29/2021   MCV 96.8 04/29/2021   PLT 151 04/29/2021   Recent Labs    02/21/21 0339 02/22/21 0403 04/15/21 1834 04/29/21 1448  NA 143 137 142 131*  K 3.1* 4.5 3.6 3.7  CL 101 98 104 96*  CO2 26 28 27 27   GLUCOSE 74 144* 77 102*  BUN <5* 13 12 10   CREATININE 0.54 0.79 0.76 0.81  CALCIUM 8.5* 9.0 9.4 9.1  GFRNONAA >60 >60 >60 >60  PROT 7.4  --   --  7.5  ALBUMIN 3.7  --   --  3.9  AST 58*  --   --  52*  ALT 57*  --   --  39  ALKPHOS 88  --   --  80  BILITOT 0.9  --   --  0.4    RADIOGRAPHIC STUDIES: I have personally reviewed the radiological images as listed and agreed with the findings in the report. CT CHEST WO CONTRAST  Result Date: 05/26/2021 CLINICAL DATA:  Cough, shortness of breath EXAM: CT CHEST WITHOUT CONTRAST TECHNIQUE: Multidetector CT imaging of the chest was performed following the standard protocol without IV contrast. RADIATION DOSE REDUCTION: This exam was performed according to the departmental  dose-optimization program which includes automated exposure control, adjustment of the mA and/or kV according to patient size and/or use of iterative reconstruction technique. COMPARISON:  Previous studies including the examination of 02/21/2021 FINDINGS: Cardiovascular: Small pericardial effusion is seen. There is ectasia of main pulmonary artery measuring 3.8 cm suggesting possible pulmonary arterial hypertension. Mediastinum/Nodes: No new significant lymphadenopathy seen. There are small calcified nodes in the mediastinum and right hilum. Lungs/Pleura: Centrilobular and panlobular emphysema is seen. Emphysematous bullae are seen in both lungs, more so on the right side. In the image 46 of series 3, there is 2.2 x 1.3 cm lobulated soft tissue density structure which has increased in size. There are linear densities in the in the posterior segment of left upper lobe and in the periphery of left lower lobe. There is no pleural effusion or pneumothorax. Upper Abdomen: There is moderate sized fixed hiatal hernia. Musculoskeletal: Unremarkable. IMPRESSION: Severe COPD.  Bullous emphysema. There is 2.2 x 1.3 cm bilobed noncalcified nodule in the right upper lobe which has increased in size. Possibility of neoplastic process is not excluded. Follow-up PET-CT and biopsy as warranted should be considered. There are linear densities in the posterior segment of right upper lobe and in the left lower lobe suggesting scarring or subsegmental atelectasis. No significant lymphadenopathy is seen in the mediastinum. Pulmonary arterial hypertension. Moderate sized fixed hiatal hernia. Small pericardial effusion. Electronically Signed   By: Elmer Picker M.D.   On: 05/26/2021 18:50    ASSESSMENT & PLAN:   Nodule of upper lobe of right lung #Approximately 2 to 2.5 cm lung nodule right upper lobe [FEB 2023; present previously a scan in November 2022]-slightly growing.   #I had long discussion that based on imaging  findings highly concerning for malignancy.   I discussed that based on imaging suggestive of early stage malignancy.   #However would recommend further work-up with a PET scan.  Patient likely need to consider biopsy or surgical evaluation.  Will discuss at tumor conference.  Discussed that early stage cancers are treated with surgery. However, given her advanced COPD-patient might be candidate for radiation.  Sent a message to [facility] regarding pulmonary evaluation.   # COPD: Severe emphysema-clinically stable.  #A-fib-on Xarelto [Dr.Khan]  Thank you  for allowing me to participate in the care of your pleasant patient. Please do not hesitate to contact me with questions or concerns in the interim.   # DISPOSITION: # PET scan ASAP # follow up 2-3 days after the PET scan-  Dr.B  # I reviewed the blood work- with the patient in detail; also reviewed the imaging independently [as summarized above]; and with the patient in detail.      All questions were answered. The patient knows to call the clinic with any problems, questions or concerns.       Cammie Sickle, MD 06/09/2021 3:42 PM

## 2021-06-09 NOTE — Assessment & Plan Note (Addendum)
#  Approximately 2 to 2.5 cm lung nodule right upper lobe [FEB 2023; present previously a scan in November 2022]-slightly growing.  No hilar or mediastinal adenopathy noted.  Recommend PET scan  #I had long discussion that based on imaging findings highly concerning for malignancy.   I discussed that based on imaging suggestive of early stage malignancy.   #However would recommend further work-up with a PET scan.  Patient likely need to consider biopsy or surgical evaluation.  Will discuss at tumor conference. Discussed that early stage cancers are treated with surgery. However, given her advanced COPD-patient might be candidate for radiation.  Sent a message to [facility] regarding pulmonary evaluation.   # COPD: Severe emphysema-clinically stable.  #A-fib-on Xarelto [Dr.Khan]  Thank you  for allowing me to participate in the care of your pleasant patient. Please do not hesitate to contact me with questions or concerns in the interim.   # DISPOSITION: # PET scan ASAP # follow up 2-3 days after the PET scan-  Dr.B  # I reviewed the blood work- with the patient in detail; also reviewed the imaging independently [as summarized above]; and with the patient in detail.

## 2021-06-12 ENCOUNTER — Encounter: Payer: Self-pay | Admitting: *Deleted

## 2021-06-12 DIAGNOSIS — R911 Solitary pulmonary nodule: Secondary | ICD-10-CM

## 2021-06-12 NOTE — Progress Notes (Signed)
Per Dr. Rogue Bussing, pt seen as initial consult on 2/24 for lung nodule. Pt requires further workup with PET scan and pulmonary evaluation. Order placed for tumor board discussion after PET scan as well as referral to pulmonology. Will continue to follow. Nothing further needed at this time.

## 2021-06-21 ENCOUNTER — Ambulatory Visit (INDEPENDENT_AMBULATORY_CARE_PROVIDER_SITE_OTHER): Payer: Medicare Other | Admitting: Pulmonary Disease

## 2021-06-21 ENCOUNTER — Other Ambulatory Visit: Payer: Self-pay

## 2021-06-21 ENCOUNTER — Ambulatory Visit (HOSPITAL_COMMUNITY)
Admission: RE | Admit: 2021-06-21 | Discharge: 2021-06-21 | Disposition: A | Discharge status: Home / Self Care | Payer: Medicare Other | Source: Ambulatory Visit | Attending: Internal Medicine | Admitting: Internal Medicine

## 2021-06-21 ENCOUNTER — Encounter: Payer: Self-pay | Admitting: Pulmonary Disease

## 2021-06-21 VITALS — BP 134/80 | HR 94 | Temp 97.8°F | Ht 68.5 in | Wt 200.6 lb

## 2021-06-21 DIAGNOSIS — R0602 Shortness of breath: Secondary | ICD-10-CM

## 2021-06-21 DIAGNOSIS — J9611 Chronic respiratory failure with hypoxia: Secondary | ICD-10-CM

## 2021-06-21 DIAGNOSIS — J449 Chronic obstructive pulmonary disease, unspecified: Secondary | ICD-10-CM

## 2021-06-21 DIAGNOSIS — R911 Solitary pulmonary nodule: Secondary | ICD-10-CM | POA: Diagnosis present

## 2021-06-21 DIAGNOSIS — J439 Emphysema, unspecified: Secondary | ICD-10-CM

## 2021-06-21 LAB — GLUCOSE, CAPILLARY: Glucose-Capillary: 108 mg/dL — ABNORMAL HIGH (ref 70–99)

## 2021-06-21 MED ORDER — FLUDEOXYGLUCOSE F - 18 (FDG) INJECTION
10.4000 | Freq: Once | INTRAVENOUS | Status: AC
  Administered 2021-06-21: 10.2 via INTRAVENOUS

## 2021-06-21 MED ORDER — STIOLTO RESPIMAT 2.5-2.5 MCG/ACT IN AERS: 2.0000 | INHALATION_SPRAY | Freq: Every day | RESPIRATORY_TRACT | 0 refills | Status: DC

## 2021-06-21 MED ORDER — ALBUTEROL SULFATE HFA 108 (90 BASE) MCG/ACT IN AERS
2.0000 | INHALATION_SPRAY | Freq: Four times a day (QID) | RESPIRATORY_TRACT | 2 refills | Status: DC | PRN
Start: 2021-06-21 — End: 2021-09-27

## 2021-06-21 NOTE — Progress Notes (Signed)
Subjective:    Patient ID: Anne Wells, female    DOB: 1950-10-12, 71 y.o.   MRN: [id number] Patient Care Team: Donnie Coffin, MD as PCP - General (Family Medicine) Center, [place] [facility]) Telford Nab, [place] as Oncology Nurse Navigator  Chief Complaint  Patient presents with   pulmonary consult    PET scan 06/21/2021--no current sx.     HPI  Patient is a 71 year old female with PMH of very severe COPD (FEV1 32%), per [facility] records, prior tobacco abuse (35 pack years, quit 2006), OSA, GERD presents for evaluation of a right upper lobe lung nodule for consideration of robotic assisted navigational bronchoscopy.  She is kindly referred by Dr. Lolita Patella.  She completed pulmonary rehab in 2016 per [facility] records.  Last pulmonary function testing was on February 2021 at [facility] showing the FEV1 of 32% time.  She had a 6-minute walk test at that time that did not show significant desaturations.  The patient follows at the [facility].  She had a lung nodule noted on imaging obtained during her recent admission to the hospital for A-fib with RVR (on Eliquis).  The patient was then seen in the emergency room in February 2023 and noted to have progression/increase in the size of the upper lobe nodule and was referred to oncology for further evaluation.  Oncology has referred to pulmonary for potential diagnostic bronchoscopy.  Patient has severe pulmonary emphysema and is usually followed at [facility] for this issue.  Patient has been asymptomatic with regards to the lung findings.  She has not noted any weight loss or anorexia.  She has cough productive of whitish sputum particularly in the mornings, this is unchanged from baseline.  She has significant dyspnea on exertion for a number of years.  She has not had any hemoptysis.  No orthopnea or paroxysmal nocturnal dyspnea.  No lower extremity edema or calf tenderness.  She has not been using  Stiolto regularly.  She uses it on an as-needed basis.  She states she does not feel she needed to do it every day.  We discussed the difference between maintenance and as needed medications.  Given the severity of her disease, it was explained to her she needs to use the medication daily.   Review of Systems A 10 point review of systems was performed and it is as noted above otherwise negative.  Past Medical History:  Diagnosis Date   COPD (chronic obstructive pulmonary disease) ([place])    Past Surgical History:  Procedure Laterality Date   Dental procedure     Patient Active Problem List   Diagnosis Date Noted   Atrial fibrillation with RVR ([place]) 02/21/2021   COPD exacerbation ([place]) 02/21/2021   Chest pain 02/21/2021   Hypokalemia 02/21/2021   Hypomagnesemia 02/21/2021   New onset atrial fibrillation ([place]) 02/21/2021   Elevated troponin 02/21/2021   GERD (gastroesophageal reflux disease) 02/21/2021   Fall at home, initial encounter 02/21/2021   Nodule of upper lobe of right lung 02/21/2021   Family History  Problem Relation Age of Onset   Bone cancer Mother    COPD Father    Bone cancer Brother    COPD Brother    Social History   Tobacco Use   Smoking status: Former    Packs/day: 0.50    Years: 34.00    Pack years: 17.00    Types: Cigarettes    Quit date: 04/16/2004    Years since  quitting: 17.1   Smokeless tobacco: Never  Substance Use Topics   Alcohol use: Yes   Allergies  Allergen Reactions   Shellfish Allergy Swelling    Swelling of the tongue   Penicillin G Rash   Current Meds  Medication Sig   ACETAMINOPHEN 8 HOUR 650 MG CR tablet SMARTSIG:1-2 Tablet(s) By Mouth Every 8 Hours PRN   acyclovir (ZOVIRAX) 400 MG tablet Take 400 mg by mouth 2 (two) times daily.   albuterol (PROVENTIL) (2.5 MG/3ML) 0.083% nebulizer solution Inhale 3 mLs into the lungs every 6 (six) hours as needed.   amiodarone (PACERONE) 200 MG tablet Take by mouth 2 (two) times daily.    cyclobenzaprine (FLEXERIL) 5 MG tablet Take 5 mg by mouth 2 (two) times daily.   enalapril (VASOTEC) 10 MG tablet Take 10 mg by mouth daily.   meloxicam (MOBIC) 7.5 MG tablet Take 1 tablet (7.5 mg total) by mouth daily as needed for pain. Home med   Multiple Vitamins-Minerals (MULTIVITAMIN ADULT, MINERALS,) TABS Take 1 tablet by mouth daily.   omeprazole (PRILOSEC) 20 MG capsule Take 20 mg by mouth daily.   oxyCODONE (ROXICODONE) 5 MG immediate release tablet Take 1 tablet (5 mg total) by mouth every 8 (eight) hours as needed.   Tiotropium Bromide-Olodaterol (STIOLTO RESPIMAT) 2.5-2.5 MCG/ACT AERS Inhale 2 Inhalers into the lungs daily at 12 noon.   Immunization History  Administered Date(s) Administered   Influenza Split 03/10/2007, 03/16/2008, 03/01/2009, 05/30/2010, 01/29/2011   Influenza, High Dose Seasonal PF 02/07/2016, 12/25/2016, 01/17/2018, 01/19/2019, 05/20/2020   Influenza, Seasonal, Injecte, Preservative Fre 12/26/2011   Influenza,inj,Quad PF,6+ Mos 01/21/2013, 01/22/2014, 12/23/2014   Influenza-Unspecified 01/29/2011, 12/26/2011, 01/21/2013, 01/22/2014, 12/23/2014   Moderna Sars-Covid-2 Vaccination 06/04/2019, 07/02/2019   Pfizer Covid-19 Vaccine Bivalent Booster 94yr & up 09/05/2020   Pneumococcal Conjugate-13 07/28/2014, 06/22/2016   Pneumococcal Polysaccharide-23 04/15/2012, 01/19/2019   Tdap 02/14/2005, 09/29/2015      Objective:   Physical Exam BP 134/80 (BP Location: Left Arm, Cuff Size: Large)   Pulse 94   Temp 97.8 F (36.6 C) (Temporal)   Ht 5' 8.5" (1.74 m)   Wt 200 lb 9.6 oz (91 kg)   SpO2 91%   BMI 30.06 kg/m  GENERAL: Well-developed, overweight woman, no acute distress, some mild use of accessories of respiration.  No conversational dyspnea. HEAD: Normocephalic, atraumatic.  EYES: Pupils equal, round, reactive to light.  No scleral icterus.  MOUTH: Nose/mouth/throat not examined due to masking requirements for COVID 19. NECK: Supple. No thyromegaly.  Trachea midline. No JVD.  No adenopathy. PULMONARY: Poor air entry bilaterally.  Very distant breath sounds, poor air movement.  Coarse, no other adventitious sounds.  Increased AP diameter (barrel chest). CARDIOVASCULAR: S1 and S2. Regular rate and rhythm.  No rubs, murmurs or gallops heard. ABDOMEN: Benign. MUSCULOSKELETAL: No joint deformity, no clubbing, no edema.  NEUROLOGIC: No overt focal deficit, no gait disturbance, speech is fluent. SKIN: Intact,warm,dry. PSYCH: Mood and behavior normal.  Representative image from the CT chest performed 25 May 2021 showing a bilobed lesion on the right upper lobe also showing EXTENSIVE bullous emphysema:   Representative image of PET/CT performed 21 June 2021, independently reviewed showing mild hypermetabolism in the nodule in question::   Ambulatory oximetry was performed today: At rest patient's heart rate was 83 bpm, O2 sat on room air 99% patient ambulated 250 feet desaturating to 86%, heart rate at 96 bpm.  Patient has significant dyspnea.  Patient then was placed on 2 L/min nasal cannula O2  maintaining oxygen saturations above 90% with supplementation and relieving sensation of dyspnea.  Patient qualifies for supplementation of oxygen during exercise.     Assessment & Plan:     ICD-10-CM   1. Lung nodule seen on imaging study  R91.1    Prohibitive risk for biopsy procedures Very severe COPD/bullous emphysema Recommend discussion at Tumor Board Will recommend empiric SBRT    2. Stage 3 severe COPD by GOLD classification ([place])  J44.9 AMB REFERRAL FOR DME    CANCELED: AMB REFERRAL FOR DME    CANCELED: Pulse oximetry, overnight   Reassess with PFTs Use Stiolto daily, not as needed Continue as needed albuterol Continue follow-up with pulmonary at [facility]    3. Bullous emphysema (HCC) - SEVERE  J43.9    Increases risk for procedural complications Prohibitive risk    4. SOB (shortness of breath)  R06.02 Pulmonary Function Test [facility]  Only   Exercise hypoxemia noted Suspect chronic respiratory failure with hypoxia Due to COPD/severe bullous emphysema    5. Chronic respiratory failure with hypoxia ([place])  J96.11    Qualified for supplemental oxygen at 2 L/min with activity Appointment oxygen during sleep as well     Orders Placed This Encounter  Procedures   AMB REFERRAL FOR DME    Referral Priority:   Routine    Referral Type:   Durable Medical Equipment Purchase    Number of Visits Requested:   1   Pulmonary Function Test [facility] Only    Standing Status:   Future    Number of Occurrences:   1    Standing Expiration Date:   06/22/2022    Scheduling Instructions:     Next available.    Order Specific Question:   Full PFT: includes the following: basic spirometry, spirometry pre & post bronchodilator, diffusion capacity (DLCO), lung volumes    Answer:   Full PFT   Meds ordered this encounter  Medications   Tiotropium Bromide-Olodaterol (STIOLTO RESPIMAT) 2.5-2.5 MCG/ACT AERS    Sig: Inhale 2 puffs into the lungs daily.    Dispense:  4 g    Refill:  0    Order Specific Question:   Lot Number?    AnswerHC:[id number]    Order Specific Question:   Expiration Date?    Answer:   06/15/2023    Order Specific Question:   Quantity    Answer:   3   albuterol (VENTOLIN HFA) 108 (90 Base) MCG/ACT inhaler    Sig: Inhale 2 puffs into the lungs every 6 (six) hours as needed for wheezing or shortness of breath.    Dispense:  8 g    Refill:  2   Will see the patient in follow-up in 4 to 6 weeks time she is to call sooner should any new problems arise.  Renold Don, MD Advanced Bronchoscopy PCCM [facility]    *This note was dictated using voice recognition software/Dragon.  Despite best efforts to proofread, errors can occur which can change the meaning. Any transcriptional errors that result from this process are unintentional and may not be fully corrected at the time of dictation.

## 2021-06-21 NOTE — Patient Instructions (Signed)
Use Stiolto 2 puffs daily.  Use it whenever you think you need or not. ? ?We we will get some breathing tests. ? ?We have sent a prescription for your albuterol to the pharmacy so you have a rescue inhaler. ? ?We have ordered oxygen for you you needed when you walk and when you sleep as well. ? ?We will see you in follow-up in 2 to 6 weeks time call sooner should any new problems arise. ? ?I will discuss your case with Dr. B at the cancer center and we will devise a plan to treat your spot in the lung. ? ?

## 2021-06-22 ENCOUNTER — Other Ambulatory Visit: Payer: Medicare Other

## 2021-06-22 NOTE — Progress Notes (Signed)
Tumor Board Documentation ? ?Anne Wells was presented by Dr Rogue Bussing and Dr Patsey Berthold at our Tumor Board on 06/22/2021, which included representatives from medical oncology, surgical, pulmonology, radiology, pathology, genetics, research, palliative care, internal medicine, radiation oncology, navigation. ? ?Anne Wells currently presents as a new patient, for discussion with history of the following treatments: active survellience. ? ?Additionally, we reviewed previous medical and familial history, history of present illness, and recent lab results along with all available histopathologic and imaging studies. The tumor board considered available treatment options and made the following recommendations: ?Radiation therapy (primary modality) (SBRT) ?Patient is not a candidate for any procedures due to COPD ? ?The following procedures/referrals were also placed: No orders of the defined types were placed in this encounter. ? ? ?Clinical Trial Status: not discussed  ? ?Staging used: Clinical Stage ?AJCC Staging: ?  ?  ?  ?Group: Right Upper obe Long Nodule presumed to be cancer ? ? ?National site-specific guidelines   were discussed with respect to the case. ? ?Tumor board is a meeting of clinicians from various specialty areas who evaluate and discuss patients for whom a multidisciplinary approach is being considered. Final determinations in the plan of care are those of the provider(s). The responsibility for follow up of recommendations given during tumor board is that of the provider.  ? ?Today?s extended care, comprehensive team conference, [place] was not present for the discussion and was not examined.  ? ?Multidisciplinary Tumor Board is a multidisciplinary case peer review process.  Decisions discussed in the Multidisciplinary Tumor Board reflect the opinions of the specialists present at the conference without having examined the patient.  Ultimately, treatment and diagnostic decisions rest with the primary  provider(s) and the patient. ? ?

## 2021-06-23 ENCOUNTER — Inpatient Hospital Stay: Payer: Medicare Other | Attending: Internal Medicine | Admitting: Internal Medicine

## 2021-06-23 ENCOUNTER — Other Ambulatory Visit: Payer: Self-pay

## 2021-06-23 VITALS — BP 138/70 | HR 72 | Temp 97.5°F | Ht 68.5 in | Wt 204.6 lb

## 2021-06-23 DIAGNOSIS — K769 Liver disease, unspecified: Secondary | ICD-10-CM | POA: Diagnosis not present

## 2021-06-23 DIAGNOSIS — E279 Disorder of adrenal gland, unspecified: Secondary | ICD-10-CM | POA: Diagnosis not present

## 2021-06-23 DIAGNOSIS — R911 Solitary pulmonary nodule: Secondary | ICD-10-CM | POA: Diagnosis not present

## 2021-06-23 DIAGNOSIS — Z7901 Long term (current) use of anticoagulants: Secondary | ICD-10-CM | POA: Insufficient documentation

## 2021-06-23 DIAGNOSIS — K7689 Other specified diseases of liver: Secondary | ICD-10-CM | POA: Diagnosis not present

## 2021-06-23 DIAGNOSIS — C3411 Malignant neoplasm of upper lobe, right bronchus or lung: Secondary | ICD-10-CM | POA: Diagnosis not present

## 2021-06-23 NOTE — Progress Notes (Signed)
[facility] NOTE  Patient Care Team: Donnie Coffin, MD as PCP - General ([facility]) [facility], [facility]) [place], [place], RN as Oncology Nurse Navigator  CHIEF COMPLAINTS/PURPOSE OF CONSULTATION: lung nodule  # IFEB 2023- There is 2.2 x 1.3 cm bilobed noncalcified nodule in the right upper lobe which has increased in size. Possibility of neoplastic process is not excluded. Follow-up PET-CT and biopsy as warranted should be considered. There are linear densities in the posterior segment of right upper lobe and in the left lower lobe suggesting scarring or subsegmental atelectasis.   No significant lymphadenopathy is seen in the mediastinum.   Pulmonary arterial hypertension. Moderate sized fixed hiatal hernia. Small pericardial effusion.  # MARCH 2023- Biopsy cannot be done because of poor respiratory status status post evaluation with Dr. Patsey Berthold.  # COPD  A.fib -Eliquis/amiodarone [Dr.Khan, cards- ]; HTN; Hx of vertigo/Hx of falls    Oncology History   No history exists.     HISTORY OF PRESENTING ILLNESS: Ambulating independently.  Accompanied by her daughter. Anne Wells 71 y.o.  female history of smoking [quit 2006]; with severe emphysema is here to review the results of the PET scan.  The interim patient was evaluated by Dr. Patsey Berthold.  Felt poor candidate for biopsy.  Also started on O2 nasal cannula in the clinic.  Patient feels improved on O2 supplementation.   Review of Systems  Constitutional:  Negative for chills, diaphoresis, fever, malaise/fatigue and weight loss.  HENT:  Negative for nosebleeds and sore throat.   Eyes:  Negative for double vision.  Respiratory:  Positive for cough, sputum production and shortness of breath. Negative for hemoptysis and wheezing.   Cardiovascular:  Negative for chest pain, palpitations, orthopnea and leg swelling.  Gastrointestinal:  Negative for abdominal pain,  blood in stool, constipation, diarrhea, heartburn, melena, nausea and vomiting.  Genitourinary:  Negative for dysuria, frequency and urgency.  Musculoskeletal:  Positive for back pain and joint pain.  Skin: Negative.  Negative for itching and rash.  Neurological:  Negative for dizziness, tingling, focal weakness, weakness and headaches.  Endo/Heme/Allergies:  Does not bruise/bleed easily.  Psychiatric/Behavioral:  Negative for depression. The patient is not nervous/anxious and does not have insomnia.     MEDICAL HISTORY:  Past Medical History:  Diagnosis Date   COPD (chronic obstructive pulmonary disease) ([place])     SURGICAL HISTORY: Past Surgical History:  Procedure Laterality Date   Dental procedure      SOCIAL HISTORY: Social History   Socioeconomic History   Marital status: Married    Spouse name: Not on file   Number of children: Not on file   Years of education: Not on file   Highest education level: Not on file  Occupational History   Not on file  Tobacco Use   Smoking status: Former    Packs/day: 0.50    Years: 34.00    Pack years: 17.00    Types: Cigarettes    Quit date: 04/16/2004    Years since quitting: 17.1   Smokeless tobacco: Never  Vaping Use   Vaping Use: Never used  Substance and Sexual Activity   Alcohol use: Yes   Drug use: Never   Sexual activity: Not Currently  Other Topics Concern   Not on file  Social History Narrative   Hx of smoking; quit in 2006. Live sin [place]; by self; daughter in [place].  Liquor once a week. Used to be housekeeping/retd- disability.  Social Determinants of Health   Financial Resource Strain: Not on file  Food Insecurity: Not on file  Transportation Needs: Not on file  Physical Activity: Not on file  Stress: Not on file  Social Connections: Not on file  Intimate Partner Violence: Not on file    FAMILY HISTORY: Family History  Problem Relation Age of Onset   Bone cancer Mother    COPD Father    Bone cancer  Brother    COPD Brother     ALLERGIES:  is allergic to shellfish allergy and penicillin g.  MEDICATIONS:  Current Outpatient Medications  Medication Sig Dispense Refill   ACETAMINOPHEN 8 HOUR 650 MG CR tablet SMARTSIG:1-2 Tablet(s) By Mouth Every 8 Hours PRN     acyclovir (ZOVIRAX) 400 MG tablet Take 400 mg by mouth 2 (two) times daily.     albuterol (PROVENTIL) (2.5 MG/3ML) 0.083% nebulizer solution Inhale 3 mLs into the lungs every 6 (six) hours as needed.     albuterol (VENTOLIN HFA) 108 (90 Base) MCG/ACT inhaler Inhale 2 puffs into the lungs every 6 (six) hours as needed for wheezing or shortness of breath. 8 g 2   amiodarone (PACERONE) 200 MG tablet Take by mouth 2 (two) times daily.     cyclobenzaprine (FLEXERIL) 5 MG tablet Take 5 mg by mouth 2 (two) times daily.     enalapril (VASOTEC) 10 MG tablet Take 10 mg by mouth daily.     meloxicam (MOBIC) 7.5 MG tablet Take 1 tablet (7.5 mg total) by mouth daily as needed for pain. Home med     Multiple Vitamins-Minerals (MULTIVITAMIN ADULT, MINERALS,) TABS Take 1 tablet by mouth daily.     omeprazole (PRILOSEC) 20 MG capsule Take 20 mg by mouth daily.     oxyCODONE (ROXICODONE) 5 MG immediate release tablet Take 1 tablet (5 mg total) by mouth every 8 (eight) hours as needed. 20 tablet 0   Tiotropium Bromide-Olodaterol (STIOLTO RESPIMAT) 2.5-2.5 MCG/ACT AERS Inhale 2 Inhalers into the lungs daily at 12 noon.     Tiotropium Bromide-Olodaterol (STIOLTO RESPIMAT) 2.5-2.5 MCG/ACT AERS Inhale 2 puffs into the lungs daily. 4 g 0   apixaban (ELIQUIS) 5 MG TABS tablet Take 1 tablet (5 mg total) by mouth 2 (two) times daily. 180 tablet 0   diltiazem (CARDIZEM CD) 180 MG 24 hr capsule Take 1 capsule (180 mg total) by mouth daily. 30 capsule 2   gabapentin (NEURONTIN) 300 MG capsule Take 2 capsules (600 mg total) by mouth 2 (two) times daily. 120 capsule 2   No current facility-administered medications for this visit.      Marland Kitchen  PHYSICAL  EXAMINATION: ECOG PERFORMANCE STATUS: 1 - Symptomatic but completely ambulatory  Vitals:   06/23/21 1302  BP: 138/70  Pulse: 72  Temp: (!) 97.5 F (36.4 C)  SpO2: 97%    Filed Weights   06/23/21 1302  Weight: 204 lb 9.6 oz (92.8 kg)     Physical Exam Vitals and nursing note reviewed.  HENT:     Head: Normocephalic and atraumatic.     Mouth/Throat:     Pharynx: Oropharynx is clear.  Eyes:     Extraocular Movements: Extraocular movements intact.     Pupils: Pupils are equal, round, and reactive to light.  Cardiovascular:     Rate and Rhythm: Normal rate and regular rhythm.  Pulmonary:     Comments: Decreased breath sounds bilaterally.  Abdominal:     Palpations: Abdomen is soft.  Musculoskeletal:  General: Normal range of motion.     Cervical back: Normal range of motion.  Skin:    General: Skin is warm.  Neurological:     General: No focal deficit present.     Mental Status: She is alert and oriented to person, place, and time.  Psychiatric:        Behavior: Behavior normal.        Judgment: Judgment normal.     LABORATORY DATA:  I have reviewed the data as listed Lab Results  Component Value Date   WBC 4.0 04/29/2021   HGB 11.6 (L) 04/29/2021   HCT 36.6 04/29/2021   MCV 96.8 04/29/2021   PLT 151 04/29/2021   Recent Labs    02/21/21 0339 02/22/21 0403 04/15/21 1834 04/29/21 1448  NA 143 137 142 131*  K 3.1* 4.5 3.6 3.7  CL 101 98 104 96*  CO2 26 28 27 27   GLUCOSE 74 144* 77 102*  BUN <5* 13 12 10   CREATININE 0.54 0.79 0.76 0.81  CALCIUM 8.5* 9.0 9.4 9.1  GFRNONAA >60 >60 >60 >60  PROT 7.4  --   --  7.5  ALBUMIN 3.7  --   --  3.9  AST 58*  --   --  52*  ALT 57*  --   --  39  ALKPHOS 88  --   --  80  BILITOT 0.9  --   --  0.4    RADIOGRAPHIC STUDIES: I have personally reviewed the radiological images as listed and agreed with the findings in the report. CT CHEST WO CONTRAST  Result Date: 05/26/2021 CLINICAL DATA:  Cough, shortness  of breath EXAM: CT CHEST WITHOUT CONTRAST TECHNIQUE: Multidetector CT imaging of the chest was performed following the standard protocol without IV contrast. RADIATION DOSE REDUCTION: This exam was performed according to the departmental dose-optimization program which includes automated exposure control, adjustment of the mA and/or kV according to patient size and/or use of iterative reconstruction technique. COMPARISON:  Previous studies including the examination of 02/21/2021 FINDINGS: Cardiovascular: Small pericardial effusion is seen. There is ectasia of main pulmonary artery measuring 3.8 cm suggesting possible pulmonary arterial hypertension. Mediastinum/Nodes: No new significant lymphadenopathy seen. There are small calcified nodes in the mediastinum and right hilum. Lungs/Pleura: Centrilobular and panlobular emphysema is seen. Emphysematous bullae are seen in both lungs, more so on the right side. In the image 46 of series 3, there is 2.2 x 1.3 cm lobulated soft tissue density structure which has increased in size. There are linear densities in the in the posterior segment of left upper lobe and in the periphery of left lower lobe. There is no pleural effusion or pneumothorax. Upper Abdomen: There is moderate sized fixed hiatal hernia. Musculoskeletal: Unremarkable. IMPRESSION: Severe COPD.  Bullous emphysema. There is 2.2 x 1.3 cm bilobed noncalcified nodule in the right upper lobe which has increased in size. Possibility of neoplastic process is not excluded. Follow-up PET-CT and biopsy as warranted should be considered. There are linear densities in the posterior segment of right upper lobe and in the left lower lobe suggesting scarring or subsegmental atelectasis. No significant lymphadenopathy is seen in the mediastinum. Pulmonary arterial hypertension. Moderate sized fixed hiatal hernia. Small pericardial effusion. Electronically Signed   By: Elmer Picker M.D.   On: 05/26/2021 18:50   NM PET  Image Initial (PI) Skull Base To Thigh (F-18 FDG)  Result Date: 06/21/2021 CLINICAL DATA:  Initial treatment strategy for right upper lobe pulmonary nodule. EXAM:  NUCLEAR MEDICINE PET SKULL BASE TO THIGH TECHNIQUE: 10.2 mCi F-18 FDG was injected intravenously. Full-ring PET imaging was performed from the skull base to thigh after the radiotracer. CT data was obtained and used for attenuation correction and anatomic localization. Fasting blood glucose: 108 mg/dl COMPARISON:  Chest CT 05/25/2021 FINDINGS: Mediastinal blood pool activity: SUV max 2.68 Liver activity: SUV max NA NECK: No hypermetabolic lymph nodes in the neck. Incidental CT findings: none CHEST: The 2 cm bilobed right upper lobe lesion is mildly hypermetabolic with SUV max of 6.07. Findings certainly worrisome for a primary lung neoplasm. No enlarged or hypermetabolic mediastinal or hilar nodes are identified. No other hypermetabolic pulmonary lesions are identified. No hypermetabolic breast lesions, supraclavicular or axillary adenopathy. Incidental CT findings: Advanced emphysematous changes and areas of pulmonary scarring. Aortic calcifications noted. Small pericardial effusion. Scattered calcified mediastinal hilar nodes. Moderate to large hiatal hernia. ABDOMEN/PELVIS: Focus of hypermetabolism is noted near or possibly in the caudate lobe of the liver. SUV max is 5.82. I do not see any definite abnormality on the CT scan. MRI abdomen without and with contrast may be helpful for further assessment. Thickening of the medial limb of the left adrenal gland with slight increased FDG uptake with SUV max of 3.89 similar to background liver activity. The right adrenal gland is normal. This could be a benign adenoma but the Hounsfield units are not diagnostic. This could also be assessed with an abdominal MRI examination. No enlarged or hypermetabolic abdominal or pelvic lymph nodes. Incidental CT findings: Moderate vascular calcifications. Colonic  diverticulosis. SKELETON: No findings suspicious for osseous metastatic disease. There is fairly significant FDG uptake in the paraspinal muscles and gluteal muscles which may be due to recent exercise nonspecific myositis. No discrete muscle lesions are identified. Incidental CT findings: none IMPRESSION: 1. Mildly hypermetabolic lobulated 2 cm right upper lobe pulmonary lesion is suspicious for primary lung neoplasm. 2. No findings for mediastinal/hilar adenopathy. 3. Small focus of hypermetabolism either in or adjacent to the caudate lobe of the liver. No obvious lesion on CT. MRI may be helpful for further evaluation. 4. Indeterminate left adrenal gland nodule could also be further assessed on MRI. 5. Significant muscle activity likely related to exercise, insulin administration or recent eating. Electronically Signed   By: Marijo Sanes M.D.   On: 06/21/2021 15:34    ASSESSMENT & PLAN:   Nodule of upper lobe of right lung # ~ 2 cm  lung nodule right upper lobe [FEB 2023] ; present previously a scan in November 2022]-slightly growing.  No hilar or mediastinal adenopathy noted. MARC 2023- PET scan shows SUV uptake concerning for malignancy.  See discussion regarding incidental findings [see below]  # Reviewed with the patient and daughter-that imaging continues to be concerning for malignancy.   I discussed that based on imaging suggestive of early stage malignancy.  Biopsy cannot be done because of poor respiratory status status post evaluation with Dr. Patsey Berthold.  Daughter concerned about radiation side effects.  Also concerned about proceeding with radiation without biopsy.  Discussed the possibility of repeating shortening scan.  For now we will proceed with referral to Dr. Donella Stade for further recommendations.   #Incidental findings on PET scan: Liver caudate lobe take/ indeterminate left adrenal gland nodule-less likely malignancy however would recommend further work-up including MRI of the abdomen  with and without contrast.  # COPD: Severe emphysema-clinically stable.  #A-fib-on Xarelto [Dr.Khan]- STABLE.   Daughter- [phone]- will call re; MRI results # DISPOSITION: # MRI  abdomen in 2-3 weeks # refer to Dr.Chrystal- re; lung cancer # follow up TBD--  Dr.B/ sent message to nurse navigator; Dr. Donella Stade.  # I reviewed the blood work- with the patient in detail; also reviewed the imaging independently [as summarized above]; and with the patient in detail.       All questions were answered. The patient knows to call the clinic with any problems, questions or concerns.       Cammie Sickle, MD 06/23/2021 5:00 PM

## 2021-06-23 NOTE — Progress Notes (Signed)
Pt states Dr. Patsey Berthold is talking about putting her on O2. ?

## 2021-06-23 NOTE — Assessment & Plan Note (Addendum)
# ~   2 cm  lung nodule right upper lobe [FEB 2023] ; present previously a scan in November 2022]-slightly growing.  No hilar or mediastinal adenopathy noted. MARC 2023- PET scan shows SUV uptake concerning for malignancy.  See discussion regarding incidental findings [see below] ? ?# Reviewed with the patient and daughter-that imaging continues to be concerning for malignancy.   I discussed that based on imaging suggestive of early stage malignancy.  Biopsy cannot be done because of poor respiratory status status post evaluation with Dr. Patsey Berthold.  Daughter concerned about radiation side effects.  Also concerned about proceeding with radiation without biopsy.  Discussed the possibility of repeating shortening scan.  For now we will proceed with referral to Dr. Donella Stade for further recommendations.  ? ?#Incidental findings on PET scan: Liver caudate lobe take/ indeterminate left adrenal gland nodule-less likely malignancy however would recommend further work-up including MRI of the abdomen with and without contrast. ? ?# COPD: Severe emphysema-clinically stable. ? ?#A-fib-on Xarelto [Dr.Khan]- STABLE.  ? ?Daughter- [phone]- will call re; MRI results ?# DISPOSITION: ?# MRI abdomen in 2-3 weeks ?# refer to Dr.Chrystal- re; lung cancer ?# follow up TBD--  Dr.B/ sent message to nurse navigator; Dr. Donella Stade. ? ?# I reviewed the blood work- with the patient in detail; also reviewed the imaging independently [as summarized above]; and with the patient in detail.  ? ?

## 2021-06-30 ENCOUNTER — Other Ambulatory Visit: Payer: Self-pay

## 2021-06-30 ENCOUNTER — Encounter: Payer: Self-pay | Admitting: Radiation Oncology

## 2021-06-30 ENCOUNTER — Encounter: Payer: Self-pay | Admitting: *Deleted

## 2021-06-30 ENCOUNTER — Ambulatory Visit
Admission: RE | Admit: 2021-06-30 | Discharge: 2021-06-30 | Disposition: A | Discharge status: Home / Self Care | Payer: Medicare Other | Source: Ambulatory Visit | Attending: Radiation Oncology | Admitting: Radiation Oncology

## 2021-06-30 ENCOUNTER — Institutional Professional Consult (permissible substitution): Payer: Medicare Other | Admitting: Radiation Oncology

## 2021-06-30 VITALS — Temp 97.0°F | Resp 18 | Ht 68.5 in | Wt 212.6 lb

## 2021-06-30 DIAGNOSIS — Z7901 Long term (current) use of anticoagulants: Secondary | ICD-10-CM | POA: Insufficient documentation

## 2021-06-30 DIAGNOSIS — Z87891 Personal history of nicotine dependence: Secondary | ICD-10-CM | POA: Insufficient documentation

## 2021-06-30 DIAGNOSIS — R911 Solitary pulmonary nodule: Secondary | ICD-10-CM | POA: Diagnosis present

## 2021-06-30 DIAGNOSIS — I10 Essential (primary) hypertension: Secondary | ICD-10-CM | POA: Insufficient documentation

## 2021-06-30 DIAGNOSIS — Z79899 Other long term (current) drug therapy: Secondary | ICD-10-CM | POA: Insufficient documentation

## 2021-06-30 NOTE — Progress Notes (Signed)
Met with Anne Wells during initial consult with Dr. Baruch Gouty to discuss radiation treatment. All questions answered during visit. Anne Wells and daughter requested second opinion at [facility]. Referral has been faxed to [facility], fax# [phone], and made aware to expect phone call from their clinic with appt. Reviewed upcoming appts for CT simulation and encouraged to keep appts while seeking second opinion at [facility]. Contact info given and instructed to call with any questions or needs. Anne Wells and her daughter, [name], verbalized understanding.  ?

## 2021-06-30 NOTE — Consult Note (Signed)
?NEW PATIENT EVALUATION ? ?Name: Anne Wells  ?MRN: [id number]  ?Date:   06/30/2021     ?DOB: November 18, 1950 ? ? ?This 71 y.o. 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 ? ?REFERRING PHYSICIAN: Donnie Coffin, MD ? ?CHIEF COMPLAINT:  ?Chief Complaint  ?Patient presents with  ? Lung Cancer  ?  Initial consultation  ? ? ?DIAGNOSIS: The encounter diagnosis was Nodule of upper lobe of right lung. ?  ?PREVIOUS INVESTIGATIONS:  ?CT scans and PET CT scan reviewed ?Clinical notes reviewed ?Labs reviewed ?Case presented at weekly tumor board ? ?HPI: Patient is a 71 year old female who was admitted for A-fib and part of her work-up a CT scan of her chest was performed.  This showed a 2.2 x 1.3 cm bilobed noncalcified nodule in the right upper lobe which had increased in size over time.  There was also evidence of pulmonary atrial hypertension.  She had a PET CT scan done showing mild hypermetabolic activity in the 2 cm mass suspicious for primary lung neoplasm.  No findings for mediastinal adenopathy were noted.  She did have a small focus of hypermetabolic activity adjacent caudate of the liver as well as an indeterminate mass in the left adrenal gland.  She has a scheduled MRI of her abdomen planned.  She has been seen by pulmonology who based on her overall status felt she was a poor candidate for biopsy and she was started on nasal oxygen.  She does have severe emphysema.  Based on tumor board recommendation she is now referred to radiation oncology for consideration of SBRT.  She has a mild nonproductive cough no hemoptysis or chest tightness. ? ?PLANNED TREATMENT REGIMEN: SBRT ? ?PAST MEDICAL HISTORY:  has a past medical history of COPD (chronic obstructive pulmonary disease) ([place]).   ? ?PAST SURGICAL HISTORY:  ?Past Surgical History:  ?Procedure Laterality Date  ? Dental procedure    ? ? ?FAMILY HISTORY: family  history includes Bone cancer in her brother and mother; COPD in her brother and father. ? ?SOCIAL HISTORY:  reports that she quit smoking about 17 years ago. Her smoking use included cigarettes. She has a 17.00 pack-year smoking history. She has never used smokeless tobacco. She reports current alcohol use. She reports that she does not use drugs. ? ?ALLERGIES: Shellfish allergy and Penicillin g ? ?MEDICATIONS:  ?Current Outpatient Medications  ?Medication Sig Dispense Refill  ? ACETAMINOPHEN 8 HOUR 650 MG CR tablet SMARTSIG:1-2 Tablet(s) By Mouth Every 8 Hours PRN    ? acyclovir (ZOVIRAX) 400 MG tablet Take 400 mg by mouth 2 (two) times daily.    ? albuterol (PROVENTIL) (2.5 MG/3ML) 0.083% nebulizer solution Inhale 3 mLs into the lungs every 6 (six) hours as needed.    ? albuterol (VENTOLIN HFA) 108 (90 Base) MCG/ACT inhaler Inhale 2 puffs into the lungs every 6 (six) hours as needed for wheezing or shortness of breath. 8 g 2  ? amiodarone (PACERONE) 200 MG tablet Take by mouth 2 (two) times daily.    ? apixaban (ELIQUIS) 5 MG TABS tablet Take 1 tablet (5 mg total) by mouth 2 (two) times daily. 180 tablet 0  ? cyclobenzaprine (FLEXERIL) 5 MG tablet Take 5 mg by mouth 2 (two) times daily.    ? diltiazem (CARDIZEM CD) 180 MG 24 hr capsule Take 1 capsule (180 mg total) by mouth daily. 30 capsule 2  ?  enalapril (VASOTEC) 10 MG tablet Take 10 mg by mouth daily.    ? gabapentin (NEURONTIN) 300 MG capsule Take 2 capsules (600 mg total) by mouth 2 (two) times daily. 120 capsule 2  ? meloxicam (MOBIC) 7.5 MG tablet Take 1 tablet (7.5 mg total) by mouth daily as needed for pain. Home med    ? Multiple Vitamins-Minerals (MULTIVITAMIN ADULT, MINERALS,) TABS Take 1 tablet by mouth daily.    ? omeprazole (PRILOSEC) 20 MG capsule Take 20 mg by mouth daily.    ? oxyCODONE (ROXICODONE) 5 MG immediate release tablet Take 1 tablet (5 mg total) by mouth every 8 (eight) hours as needed. 20 tablet 0  ? Tiotropium Bromide-Olodaterol  (STIOLTO RESPIMAT) 2.5-2.5 MCG/ACT AERS Inhale 2 Inhalers into the lungs daily at 12 noon.    ? Tiotropium Bromide-Olodaterol (STIOLTO RESPIMAT) 2.5-2.5 MCG/ACT AERS Inhale 2 puffs into the lungs daily. 4 g 0  ? ?No current facility-administered medications for this encounter.  ? ? ?ECOG PERFORMANCE STATUS:  0 - Asymptomatic ? ?REVIEW OF SYSTEMS: ?Patient denies any weight loss, fatigue, weakness, fever, chills or night sweats. Patient denies any loss of vision, blurred vision. Patient denies any ringing  of the ears or hearing loss. No irregular heartbeat. Patient denies heart murmur or history of fainting. Patient denies any chest pain or pain radiating to her upper extremities. Patient denies any shortness of breath, difficulty breathing at night, cough or hemoptysis. Patient denies any swelling in the lower legs. Patient denies any nausea vomiting, vomiting of blood, or coffee ground material in the vomitus. Patient denies any stomach pain. Patient states has had normal bowel movements no significant constipation or diarrhea. Patient denies any dysuria, hematuria or significant nocturia. Patient denies any problems walking, swelling in the joints or loss of balance. Patient denies any skin changes, loss of hair or loss of weight. Patient denies any excessive worrying or anxiety or significant depression. Patient denies any problems with insomnia. Patient denies excessive thirst, polyuria, polydipsia. Patient denies any swollen glands, patient denies easy bruising or easy bleeding. Patient denies any recent infections, allergies or URI. Patient "s visual fields have not changed significantly in recent time. ?  ?PHYSICAL EXAM: ?Temp (!) 97 ?F (36.1 ?C)   Resp 18   Ht 5' 8.5" (1.74 m)   Wt 212 lb 9.6 oz (96.4 kg)   BMI 31.86 kg/m?  ?Well-developed well-nourished patient in NAD. HEENT reveals PERLA, EOMI, discs not visualized.  Oral cavity is clear. No oral mucosal lesions are identified. Neck is clear without  evidence of cervical or supraclavicular adenopathy. Lungs are clear to A&P. Cardiac examination is essentially unremarkable with regular rate and rhythm without murmur rub or thrill. Abdomen is benign with no organomegaly or masses noted. Motor sensory and DTR levels are equal and symmetric in the upper and lower extremities. Cranial nerves II through XII are grossly intact. Proprioception is intact. No peripheral adenopathy or edema is identified. No motor or sensory levels are noted. Crude visual fields are within normal range. ? ?LABORATORY DATA: Labs reviewed ? ?  ?RADIOLOGY RESULTS: CT scan and PET CT scan reviewed compatible with above-stated findings ? ? ?IMPRESSION: Stage I A3 non-small cell lung cancer of the right upper lobe nonbiopsied in 71 year old female ? ?PLAN: At this time I have recommended SBRT to her right upper lobe we will plan on delivering 60 Gray in 5 fractions.  Would use for dimensional treatment planning and motion restriction during simulation.  Risks and benefits of  treatment including extremely low side effect profile possible developing a cough fatigue some architectural distortion of her normal lung fields all were discussed in detail with the patient.  She seems to comprehend my treatment plan well.  She is seeking a second opinion.  I have gone ahead and given her a simulation appointment at the end of the month. ? ?I would like to take this opportunity to thank you for allowing me to participate in the care of your patient.. ? ?Noreene Filbert, MD ? ? ? ? ? ? ? ? ?

## 2021-07-05 ENCOUNTER — Ambulatory Visit
Admission: RE | Admit: 2021-07-05 | Discharge: 2021-07-05 | Disposition: A | Discharge status: Home / Self Care | Payer: Medicare Other | Source: Ambulatory Visit | Attending: Internal Medicine | Admitting: Internal Medicine

## 2021-07-05 ENCOUNTER — Other Ambulatory Visit: Payer: Self-pay

## 2021-07-05 DIAGNOSIS — K769 Liver disease, unspecified: Secondary | ICD-10-CM | POA: Insufficient documentation

## 2021-07-05 MED ORDER — GADOBUTROL 1 MMOL/ML IV SOLN
10.0000 mL | Freq: Once | INTRAVENOUS | Status: AC | PRN
  Administered 2021-07-05: 10 mL via INTRAVENOUS

## 2021-07-11 ENCOUNTER — Inpatient Hospital Stay: Admission: RE | Admit: 2021-07-11 | Payer: Medicare Other | Source: Ambulatory Visit

## 2021-07-11 ENCOUNTER — Other Ambulatory Visit: Payer: Medicare Other

## 2021-07-12 ENCOUNTER — Ambulatory Visit: Payer: Medicare Other | Attending: Pulmonary Disease

## 2021-07-12 DIAGNOSIS — R0602 Shortness of breath: Secondary | ICD-10-CM | POA: Diagnosis present

## 2021-07-13 ENCOUNTER — Ambulatory Visit: Payer: Medicare Other

## 2021-07-17 DIAGNOSIS — C3491 Malignant neoplasm of unspecified part of right bronchus or lung: Secondary | ICD-10-CM | POA: Diagnosis present

## 2021-07-24 ENCOUNTER — Encounter: Payer: Self-pay | Admitting: *Deleted

## 2021-07-24 NOTE — Progress Notes (Signed)
Pt completed second opinion visit at [facility] on 07/14/21. Based on chart review, pt has not been scheduled for any further follow up appts with [facility] or [facility]. Follow up phone call made to pt's daughter, [name], to follow up if pt would like to be rescheduled for appts at [facility]. Instructed to call back to let us know.  ?

## 2021-07-25 ENCOUNTER — Ambulatory Visit: Payer: Medicare Other

## 2021-07-26 ENCOUNTER — Ambulatory Visit: Payer: Medicare Other | Admitting: Pulmonary Disease

## 2021-07-27 ENCOUNTER — Ambulatory Visit: Payer: Medicare Other

## 2021-07-29 ENCOUNTER — Other Ambulatory Visit: Payer: Self-pay

## 2021-07-29 ENCOUNTER — Observation Stay
Admission: EM | Admit: 2021-07-29 | Discharge: 2021-07-30 | Disposition: A | Discharge status: Home / Self Care | Payer: Medicare Other | Attending: Internal Medicine | Admitting: Internal Medicine

## 2021-07-29 ENCOUNTER — Emergency Department: Payer: Medicare Other

## 2021-07-29 ENCOUNTER — Encounter: Payer: Self-pay | Admitting: Internal Medicine

## 2021-07-29 DIAGNOSIS — Z87891 Personal history of nicotine dependence: Secondary | ICD-10-CM | POA: Insufficient documentation

## 2021-07-29 DIAGNOSIS — R748 Abnormal levels of other serum enzymes: Secondary | ICD-10-CM | POA: Diagnosis not present

## 2021-07-29 DIAGNOSIS — Z20822 Contact with and (suspected) exposure to covid-19: Secondary | ICD-10-CM | POA: Diagnosis not present

## 2021-07-29 DIAGNOSIS — E876 Hypokalemia: Secondary | ICD-10-CM | POA: Diagnosis not present

## 2021-07-29 DIAGNOSIS — K2900 Acute gastritis without bleeding: Secondary | ICD-10-CM | POA: Diagnosis present

## 2021-07-29 DIAGNOSIS — I10 Essential (primary) hypertension: Secondary | ICD-10-CM | POA: Insufficient documentation

## 2021-07-29 DIAGNOSIS — J449 Chronic obstructive pulmonary disease, unspecified: Secondary | ICD-10-CM | POA: Diagnosis not present

## 2021-07-29 DIAGNOSIS — C3491 Malignant neoplasm of unspecified part of right bronchus or lung: Secondary | ICD-10-CM | POA: Diagnosis not present

## 2021-07-29 DIAGNOSIS — A419 Sepsis, unspecified organism: Secondary | ICD-10-CM | POA: Insufficient documentation

## 2021-07-29 DIAGNOSIS — D696 Thrombocytopenia, unspecified: Secondary | ICD-10-CM | POA: Insufficient documentation

## 2021-07-29 DIAGNOSIS — E871 Hypo-osmolality and hyponatremia: Secondary | ICD-10-CM | POA: Insufficient documentation

## 2021-07-29 DIAGNOSIS — R197 Diarrhea, unspecified: Secondary | ICD-10-CM | POA: Diagnosis present

## 2021-07-29 DIAGNOSIS — K529 Noninfective gastroenteritis and colitis, unspecified: Secondary | ICD-10-CM | POA: Diagnosis not present

## 2021-07-29 DIAGNOSIS — F1090 Alcohol use, unspecified, uncomplicated: Secondary | ICD-10-CM

## 2021-07-29 DIAGNOSIS — Z7901 Long term (current) use of anticoagulants: Secondary | ICD-10-CM | POA: Diagnosis not present

## 2021-07-29 DIAGNOSIS — R7401 Elevation of levels of liver transaminase levels: Secondary | ICD-10-CM | POA: Insufficient documentation

## 2021-07-29 DIAGNOSIS — Z79899 Other long term (current) drug therapy: Secondary | ICD-10-CM | POA: Insufficient documentation

## 2021-07-29 DIAGNOSIS — I4891 Unspecified atrial fibrillation: Secondary | ICD-10-CM

## 2021-07-29 DIAGNOSIS — I480 Paroxysmal atrial fibrillation: Secondary | ICD-10-CM | POA: Insufficient documentation

## 2021-07-29 LAB — CBC WITH DIFFERENTIAL/PLATELET
Abs Immature Granulocytes: 0.04 10*3/uL (ref 0.00–0.07)
Basophils Absolute: 0 10*3/uL (ref 0.0–0.1)
Basophils Relative: 0 %
Eosinophils Absolute: 0 10*3/uL (ref 0.0–0.5)
Eosinophils Relative: 0 %
HCT: 40.6 % (ref 36.0–46.0)
Hemoglobin: 12.7 g/dL (ref 12.0–15.0)
Immature Granulocytes: 1 %
Lymphocytes Relative: 8 %
Lymphs Abs: 0.3 10*3/uL — ABNORMAL LOW (ref 0.7–4.0)
MCH: 29.1 pg (ref 26.0–34.0)
MCHC: 31.3 g/dL (ref 30.0–36.0)
MCV: 93.1 fL (ref 80.0–100.0)
Monocytes Absolute: 0.2 10*3/uL (ref 0.1–1.0)
Monocytes Relative: 5 %
Neutro Abs: 3.5 10*3/uL (ref 1.7–7.7)
Neutrophils Relative %: 86 %
Platelets: 102 10*3/uL — ABNORMAL LOW (ref 150–400)
RBC: 4.36 MIL/uL (ref 3.87–5.11)
RDW: 15.1 % (ref 11.5–15.5)
WBC: 4.1 10*3/uL (ref 4.0–10.5)
nRBC: 0 % (ref 0.0–0.2)

## 2021-07-29 LAB — COMPREHENSIVE METABOLIC PANEL
ALT: 232 U/L — ABNORMAL HIGH (ref 0–44)
AST: 237 U/L — ABNORMAL HIGH (ref 15–41)
Albumin: 3.7 g/dL (ref 3.5–5.0)
Alkaline Phosphatase: 83 U/L (ref 38–126)
Anion gap: 13 (ref 5–15)
BUN: 16 mg/dL (ref 8–23)
CO2: 27 mmol/L (ref 22–32)
Calcium: 9 mg/dL (ref 8.9–10.3)
Chloride: 88 mmol/L — ABNORMAL LOW (ref 98–111)
Creatinine, Ser: 0.81 mg/dL (ref 0.44–1.00)
GFR, Estimated: >60 mL/min (ref >60)
Glucose, Bld: 144 mg/dL — ABNORMAL HIGH (ref 70–99)
Potassium: 2.7 mmol/L — CL (ref 3.5–5.1)
Sodium: 128 mmol/L — ABNORMAL LOW (ref 135–145)
Total Bilirubin: 0.7 mg/dL (ref 0.3–1.2)
Total Protein: 7.8 g/dL (ref 6.5–8.1)

## 2021-07-29 LAB — LACTIC ACID, PLASMA
Lactic Acid, Venous: 1.1 mmol/L (ref 0.5–1.9)
Lactic Acid, Venous: 0.9 mmol/L (ref 0.5–1.9)

## 2021-07-29 LAB — GLUCOSE, CAPILLARY
Glucose-Capillary: 114 mg/dL — ABNORMAL HIGH (ref 70–99)
Glucose-Capillary: 119 mg/dL — ABNORMAL HIGH (ref 70–99)
Glucose-Capillary: 135 mg/dL — ABNORMAL HIGH (ref 70–99)
Glucose-Capillary: 107 mg/dL — ABNORMAL HIGH (ref 70–99)

## 2021-07-29 LAB — GASTROINTESTINAL PANEL BY PCR, STOOL (REPLACES STOOL CULTURE)

## 2021-07-29 LAB — TROPONIN I (HIGH SENSITIVITY)
Troponin I (High Sensitivity): 22 ng/L — ABNORMAL HIGH (ref <18)
Troponin I (High Sensitivity): 26 ng/L — ABNORMAL HIGH (ref <18)

## 2021-07-29 LAB — RESP PANEL BY RT-PCR (FLU A&B, COVID) ARPGX2
Influenza A by PCR: NEGATIVE
Influenza B by PCR: NEGATIVE
SARS Coronavirus 2 by RT PCR: NEGATIVE

## 2021-07-29 LAB — PROTIME-INR
INR: 1.4 — ABNORMAL HIGH (ref 0.8–1.2)
Prothrombin Time: 16.7 seconds — ABNORMAL HIGH (ref 11.4–15.2)

## 2021-07-29 LAB — HEPATITIS PANEL, ACUTE
HCV Ab: NONREACTIVE
Hep A IgM: NONREACTIVE
Hep B C IgM: NONREACTIVE
Hepatitis B Surface Ag: NONREACTIVE

## 2021-07-29 LAB — LIPASE, BLOOD: Lipase: 57 U/L — ABNORMAL HIGH (ref 11–51)

## 2021-07-29 LAB — MAGNESIUM: Magnesium: 1.5 mg/dL — ABNORMAL LOW (ref 1.7–2.4)

## 2021-07-29 LAB — POTASSIUM: Potassium: 3 mmol/L — ABNORMAL LOW (ref 3.5–5.1)

## 2021-07-29 LAB — PROCALCITONIN: Procalcitonin: 0.3 ng/mL

## 2021-07-29 LAB — CORTISOL-AM, BLOOD: Cortisol - AM: 49.3 ug/dL — ABNORMAL HIGH (ref 6.7–22.6)

## 2021-07-29 MED ORDER — METRONIDAZOLE 500 MG/100ML IV SOLN
500.0000 mg | Freq: Two times a day (BID) | INTRAVENOUS | Status: DC
  Administered 2021-07-29 (×2): 500 mg via INTRAVENOUS
  Filled 2021-07-29 (×3): qty 100

## 2021-07-29 MED ORDER — POTASSIUM CHLORIDE CRYS ER 20 MEQ PO TBCR
40.0000 meq | EXTENDED_RELEASE_TABLET | Freq: Once | ORAL | Status: AC
  Administered 2021-07-29: 40 meq via ORAL

## 2021-07-29 MED ORDER — ONDANSETRON HCL 4 MG PO TABS: 4.0000 mg | ORAL_TABLET | Freq: Four times a day (QID) | ORAL | Status: DC | PRN

## 2021-07-29 MED ORDER — ACETAMINOPHEN 650 MG RE SUPP: 650.0000 mg | Freq: Four times a day (QID) | RECTAL | Status: DC | PRN

## 2021-07-29 MED ORDER — APIXABAN 5 MG PO TABS
5.0000 mg | ORAL_TABLET | Freq: Two times a day (BID) | ORAL | Status: DC
  Administered 2021-07-29 – 2021-07-30 (×3): 5 mg via ORAL
  Filled 2021-07-29 (×3): qty 1

## 2021-07-29 MED ORDER — ONDANSETRON HCL 4 MG/2ML IJ SOLN: 4.0000 mg | Freq: Four times a day (QID) | INTRAMUSCULAR | Status: DC | PRN

## 2021-07-29 MED ORDER — FOLIC ACID 1 MG PO TABS
1.0000 mg | ORAL_TABLET | Freq: Every day | ORAL | Status: DC
  Administered 2021-07-29 – 2021-07-30 (×2): 1 mg via ORAL
  Filled 2021-07-29 (×2): qty 1

## 2021-07-29 MED ORDER — METRONIDAZOLE 500 MG/100ML IV SOLN: 500.0000 mg | Freq: Two times a day (BID) | INTRAVENOUS | Status: DC

## 2021-07-29 MED ORDER — THIAMINE HCL 100 MG/ML IJ SOLN
100.0000 mg | Freq: Every day | INTRAMUSCULAR | Status: DC
  Filled 2021-07-29: qty 2

## 2021-07-29 MED ORDER — POTASSIUM CHLORIDE 10 MEQ/100ML IV SOLN
10.0000 meq | INTRAVENOUS | Status: AC
  Administered 2021-07-29 (×2): 10 meq via INTRAVENOUS
  Filled 2021-07-29: qty 100

## 2021-07-29 MED ORDER — POTASSIUM CHLORIDE 10 MEQ/100ML IV SOLN
10.0000 meq | INTRAVENOUS | Status: AC
  Administered 2021-07-29 (×4): 10 meq via INTRAVENOUS
  Filled 2021-07-29: qty 100

## 2021-07-29 MED ORDER — ADULT MULTIVITAMIN W/MINERALS CH
1.0000 | ORAL_TABLET | Freq: Every day | ORAL | Status: DC
  Administered 2021-07-29 – 2021-07-30 (×2): 1 via ORAL
  Filled 2021-07-29 (×2): qty 1

## 2021-07-29 MED ORDER — LORAZEPAM 2 MG/ML IJ SOLN: 0.0000 mg | Freq: Four times a day (QID) | INTRAMUSCULAR | Status: DC

## 2021-07-29 MED ORDER — ARFORMOTEROL TARTRATE 15 MCG/2ML IN NEBU
15.0000 ug | INHALATION_SOLUTION | Freq: Two times a day (BID) | RESPIRATORY_TRACT | Status: DC
Start: 2021-07-29 — End: 2021-07-30
  Administered 2021-07-29 – 2021-07-30 (×2): 15 ug via RESPIRATORY_TRACT
  Filled 2021-07-29 (×5): qty 2

## 2021-07-29 MED ORDER — LORAZEPAM 2 MG/ML IJ SOLN: 1.0000 mg | INTRAMUSCULAR | Status: DC | PRN

## 2021-07-29 MED ORDER — UMECLIDINIUM BROMIDE 62.5 MCG/ACT IN AEPB
1.0000 | INHALATION_SPRAY | Freq: Every day | RESPIRATORY_TRACT | Status: DC
  Administered 2021-07-29 – 2021-07-30 (×2): 1 via RESPIRATORY_TRACT
  Filled 2021-07-29: qty 7

## 2021-07-29 MED ORDER — INSULIN ASPART 100 UNIT/ML IJ SOLN: 0.0000 [IU] | Freq: Every day | INTRAMUSCULAR | Status: DC

## 2021-07-29 MED ORDER — INSULIN ASPART 100 UNIT/ML IJ SOLN
0.0000 [IU] | Freq: Three times a day (TID) | INTRAMUSCULAR | Status: DC
  Administered 2021-07-29: 2 [IU] via SUBCUTANEOUS
  Filled 2021-07-29: qty 1

## 2021-07-29 MED ORDER — METRONIDAZOLE 500 MG/100ML IV SOLN: 500.0000 mg | Freq: Once | INTRAVENOUS | Status: DC

## 2021-07-29 MED ORDER — THIAMINE HCL 100 MG PO TABS
100.0000 mg | ORAL_TABLET | Freq: Every day | ORAL | Status: DC
  Administered 2021-07-29 – 2021-07-30 (×2): 100 mg via ORAL
  Filled 2021-07-29 (×3): qty 1

## 2021-07-29 MED ORDER — ENALAPRIL MALEATE 10 MG PO TABS
10.0000 mg | ORAL_TABLET | Freq: Every day | ORAL | Status: DC
  Filled 2021-07-29: qty 1

## 2021-07-29 MED ORDER — VANCOMYCIN HCL IN DEXTROSE 1-5 GM/200ML-% IV SOLN: 1000.0000 mg | Freq: Once | INTRAVENOUS | Status: DC

## 2021-07-29 MED ORDER — POTASSIUM CHLORIDE CRYS ER 20 MEQ PO TBCR
40.0000 meq | EXTENDED_RELEASE_TABLET | Freq: Two times a day (BID) | ORAL | Status: AC
  Administered 2021-07-29 (×2): 40 meq via ORAL
  Filled 2021-07-29 (×2): qty 2

## 2021-07-29 MED ORDER — SODIUM CHLORIDE 0.9 % IV BOLUS
1000.0000 mL | Freq: Once | INTRAVENOUS | Status: AC
  Administered 2021-07-29: 1000 mL via INTRAVENOUS

## 2021-07-29 MED ORDER — SODIUM CHLORIDE 0.9 % IV SOLN
INTRAVENOUS | Status: DC | PRN
Start: 2021-07-29 — End: 2021-07-30

## 2021-07-29 MED ORDER — ACETAMINOPHEN 325 MG PO TABS
650.0000 mg | ORAL_TABLET | Freq: Four times a day (QID) | ORAL | Status: DC | PRN
Start: 2021-07-29 — End: 2021-07-30

## 2021-07-29 MED ORDER — VANCOMYCIN HCL 2000 MG/400ML IV SOLN
2000.0000 mg | Freq: Once | INTRAVENOUS | Status: AC
Start: 2021-07-29 — End: 2021-07-30
  Administered 2021-07-29: 2000 mg via INTRAVENOUS
  Filled 2021-07-29: qty 400

## 2021-07-29 MED ORDER — SODIUM CHLORIDE 0.9 % IV SOLN
2.0000 g | Freq: Once | INTRAVENOUS | Status: AC
  Administered 2021-07-29: 2 g via INTRAVENOUS
  Filled 2021-07-29: qty 12.5

## 2021-07-29 MED ORDER — SODIUM CHLORIDE 0.9 % IV SOLN
2.0000 g | INTRAVENOUS | Status: DC
  Administered 2021-07-29: 2 g via INTRAVENOUS
  Filled 2021-07-29 (×2): qty 20

## 2021-07-29 MED ORDER — ACETAMINOPHEN 500 MG PO TABS
1000.0000 mg | ORAL_TABLET | Freq: Once | ORAL | Status: AC
  Administered 2021-07-29: 1000 mg via ORAL
  Filled 2021-07-29: qty 2

## 2021-07-29 MED ORDER — ALBUTEROL SULFATE (2.5 MG/3ML) 0.083% IN NEBU: 3.0000 mL | INHALATION_SOLUTION | Freq: Four times a day (QID) | RESPIRATORY_TRACT | Status: DC | PRN

## 2021-07-29 MED ORDER — MAGNESIUM SULFATE 2 GM/50ML IV SOLN
2.0000 g | Freq: Once | INTRAVENOUS | Status: AC
  Administered 2021-07-29: 2 g via INTRAVENOUS

## 2021-07-29 MED ORDER — LORAZEPAM 1 MG PO TABS: 1.0000 mg | ORAL_TABLET | ORAL | Status: DC | PRN

## 2021-07-29 MED ORDER — SODIUM CHLORIDE 0.9 % IV SOLN: INTRAVENOUS | Status: DC

## 2021-07-29 MED ORDER — LORAZEPAM 2 MG/ML IJ SOLN: 0.0000 mg | Freq: Two times a day (BID) | INTRAMUSCULAR | Status: DC

## 2021-07-29 NOTE — Assessment & Plan Note (Signed)
Secondary to GI losses.  IV repletion and monitor ?

## 2021-07-29 NOTE — Progress Notes (Signed)
PHARMACY -  BRIEF ANTIBIOTIC NOTE  ? ?Pharmacy has received consult(s) for Vancomycin, Cefepime from an ED provider.  The patient's profile has been reviewed for ht/wt/allergies/indication/available labs.   ? ?One time order(s) placed for Vancomycin 2 gm IV X 1 and cefepime 2 gm IV X 1.  ? ?Further antibiotics/pharmacy consults should be ordered by admitting physician if indicated.       ?                ?Thank you, ?Kiona Blume D ?07/29/2021  4:32 AM ? ?

## 2021-07-29 NOTE — Assessment & Plan Note (Signed)
Currently in sinus rhythm ?Continue amiodarone and apixaban ?

## 2021-07-29 NOTE — ED Notes (Signed)
See triage note. Pt reports recent cancer in lungs dx; pt reports diarrhea at times and fever. Resp reg/unlabored currently; slight upper wheezing noted.  ?

## 2021-07-29 NOTE — ED Notes (Signed)
Provider [place] notified in person of potassium 2.7.  ?

## 2021-07-29 NOTE — ED Notes (Signed)
Repeat EKG captured per verbal from [place].  ?

## 2021-07-29 NOTE — Assessment & Plan Note (Signed)
Not acutely exacerbated ?Continue home inhalers with DuoNebs as needed ?

## 2021-07-29 NOTE — Assessment & Plan Note (Addendum)
Controlled.  Continue enalapril ?

## 2021-07-29 NOTE — Progress Notes (Signed)
CODE SEPSIS - PHARMACY COMMUNICATION ? ?**Broad Spectrum Antibiotics should be administered within 1 hour of Sepsis diagnosis** ? ?Time Code Sepsis Called/Page Received: 4/15 @ 2060 ? ?Antibiotics Ordered: Vanc, Cefepime  ? ?Time of 1st antibiotic administration: Cefepime 2 gm IV X 1 on 4/15 @ 0438 ? ?Additional action taken by pharmacy:  ? ?If necessary, Name of Provider/Nurse Contacted:  ? ? ? ?Breckyn Ticas D ,PharmD ?Clinical Pharmacist  ?07/29/2021  4:44 AM  ?

## 2021-07-29 NOTE — Assessment & Plan Note (Addendum)
Continue apixaban 

## 2021-07-29 NOTE — H&P (Addendum)
?History and Physical  ? ? ?Patient: Anne Wells OZD:[id number] DOB: [birth year] ?DOA: 07/29/2021 ?DOS: the patient was seen and examined on 07/29/2021 ?PCP: Donnie Coffin, MD  ?Patient coming from: Home ? ?Chief Complaint:  ?Chief Complaint  ?Patient presents with  ? Weakness  ? ? ?HPI: Anne Wells is a 71 y.o. female with medical history significant for Paroxysmal A-fib on amiodarone and Eliquis, HTN, COPD, right upper lobe lung nodule with PET scan suggesting malignant neoplasm, not a candidate for biopsy per review of pulmonology notes, who presents to the ED with a 2-day history of vomiting and diarrhea as well as fever.  She has no affected contacts.  Has decreased oral intake.  Has a history of alcohol use and last alcohol intake was 2 days prior.  Denies abdominal pain, cough or chest pain.  Feels very weak. ?ED course and data review: On arrival febrile to 103.1 and tachypneic to 22 with otherwise normal vitals WBC normal at 4100 with procalcitonin of 0.3.  Lactic acid pending.  Labs otherwise significant for sodium 128, potassium 2.7, AST/ALT of 237/232.  Magnesium 1.5 and troponin 22.  COVID and flu negative.  EKG, personally viewed and interpreted as sinus rhythm with nonspecific ST-T wave changes.  Imaging: Right upper quadrant sonogram with benign appearing gallbladder polyp small amount of gallbladder sludge and hepatic steatosis without focal liver lesions.  Chest x-ray showed stable right upper lobe pulmonary nodule.  Patient was started on cefepime metronidazole and vancomycin.  She was also given IV magnesium and potassium and an IV fluid bolus.  Hospitalist consulted for admission  ? ?Review of Systems: As mentioned in the history of present illness. All other systems reviewed and are negative. ?Past Medical History:  ?Diagnosis Date  ? COPD (chronic obstructive pulmonary disease) ([place])   ? ?Past Surgical History:  ?Procedure Laterality Date  ? Dental procedure    ? ?Social History:  reports  that she quit smoking about 17 years ago. Her smoking use included cigarettes. She has a 17.00 pack-year smoking history. She has never used smokeless tobacco. She reports current alcohol use. She reports that she does not use drugs. ? ?Allergies  ?Allergen Reactions  ? Shellfish Allergy Swelling  ?  Swelling of the tongue  ? Penicillin G Rash  ? ? ?Family History  ?Problem Relation Age of Onset  ? Bone cancer Mother   ? COPD Father   ? Bone cancer Brother   ? COPD Brother   ? ? ?Prior to Admission medications   ?Medication Sig Start Date End Date Taking? Authorizing Provider  ?ACETAMINOPHEN 8 HOUR 650 MG CR tablet SMARTSIG:1-2 Tablet(s) By Mouth Every 8 Hours PRN 03/27/21   [provider]  ?acyclovir (ZOVIRAX) 400 MG tablet Take 400 mg by mouth 2 (two) times daily. 03/27/21   [provider]  ?albuterol (PROVENTIL) (2.5 MG/3ML) 0.083% nebulizer solution Inhale 3 mLs into the lungs every 6 (six) hours as needed. 09/23/20 09/23/21  [provider]  ?albuterol (VENTOLIN HFA) 108 (90 Base) MCG/ACT inhaler Inhale 2 puffs into the lungs every 6 (six) hours as needed for wheezing or shortness of breath. 06/21/21   Tyler Pita, MD  ?amiodarone (PACERONE) 200 MG tablet Take by mouth 2 (two) times daily. 03/27/21   [provider]  ?apixaban (ELIQUIS) 5 MG TABS tablet Take 1 tablet (5 mg total) by mouth 2 (two) times daily. 02/22/21 05/23/21  Enzo Bi, MD  ?cyclobenzaprine (FLEXERIL) 5 MG tablet Take 5  mg by mouth 2 (two) times daily. 01/05/21   [provider]  ?diltiazem (CARDIZEM CD) 180 MG 24 hr capsule Take 1 capsule (180 mg total) by mouth daily. 02/23/21 05/24/21  Enzo Bi, MD  ?enalapril (VASOTEC) 10 MG tablet Take 10 mg by mouth daily. 04/11/21   [provider]  ?gabapentin (NEURONTIN) 300 MG capsule Take 2 capsules (600 mg total) by mouth 2 (two) times daily. 02/22/21 05/23/21  Enzo Bi, MD  ?meloxicam (MOBIC) 7.5 MG tablet Take 1 tablet (7.5 mg total) by mouth  daily as needed for pain. Home med 02/22/21   Enzo Bi, MD  ?Multiple Vitamins-Minerals (MULTIVITAMIN ADULT, MINERALS,) TABS Take 1 tablet by mouth daily.    [provider]  ?omeprazole (PRILOSEC) 20 MG capsule Take 20 mg by mouth daily. 11/15/20   [provider]  ?oxyCODONE (ROXICODONE) 5 MG immediate release tablet Take 1 tablet (5 mg total) by mouth every 8 (eight) hours as needed. 04/16/21 04/16/22  Rada Hay, MD  ?Tiotropium Bromide-Olodaterol (STIOLTO RESPIMAT) 2.5-2.5 MCG/ACT AERS Inhale 2 Inhalers into the lungs daily at 12 noon. 09/23/20   [provider]  ?Tiotropium Bromide-Olodaterol (STIOLTO RESPIMAT) 2.5-2.5 MCG/ACT AERS Inhale 2 puffs into the lungs daily. 06/21/21   Tyler Pita, MD  ? ? ?Physical Exam: ?Vitals:  ? 07/29/21 0153 07/29/21 0221 07/29/21 0232 07/29/21 0301  ?BP:   120/78   ?Pulse: 85 77 76   ?Resp: 20 (!) 23 (!) 22   ?Temp:    100.2 ?F (37.9 ?C)  ?TempSrc:    Oral  ?SpO2: 93% 100% 100%   ?Weight:      ?Height:      ? ?Physical Exam ?Vitals and nursing note reviewed.  ?Constitutional:   ?   General: She is not in acute distress. ?   Appearance: She is ill-appearing.  ?HENT:  ?   Head: Normocephalic and atraumatic.  ?Cardiovascular:  ?   Rate and Rhythm: Normal rate and regular rhythm.  ?   Heart sounds: Normal heart sounds.  ?Pulmonary:  ?   Effort: Pulmonary effort is normal.  ?   Breath sounds: Normal breath sounds.  ?Abdominal:  ?   Palpations: Abdomen is soft.  ?   Tenderness: There is no abdominal tenderness.  ?Neurological:  ?   Mental Status: Mental status is at baseline.  ? ? ? ?Data Reviewed: ?Relevant notes from primary care and specialist visits, past discharge summaries as available in EHR, including Care Everywhere. ?Prior diagnostic testing as pertinent to current admission diagnoses ?Updated medications and problem lists for reconciliation ?ED course, including vitals, labs, imaging, treatment and response to treatment ?Triage notes,  nursing and pharmacy notes and ED provider's notes ?Notable results as noted in HPI ? ? ?Assessment and Plan: ?* Acute gastroenteritis ?Fever, vomiting and diarrhea x2 days abdominal pain, presumed infectious ?Received cefepime and vancomycin in the ED for possible sepsis of unknown source ?We will continue antibiotics for possible intra-abdominal infection ?IV fluids, IV antiemetics and supportive care ?Stool studies ? ?Elevated lipase ?Likely reactive.  Patient denies pain ? ?Hyponatremia ?Secondary to dehydration from vomiting and diarrhea ?IV hydration and monitor ? ?Transaminitis ?Suspect secondary to alcohol use and hepatic steatosis.  Continue to monitor ?Can consider acute hepatitis panel if not improving ? ?Hypokalemia ?Secondary to GI losses.  IV repletion and monitor ? ?Alcohol use disorder ?Patient had no alcohol in the past 2 days secondary to vomiting ?CIWA withdrawal protocol ? ?Chronic anticoagulation ?Continue apixaban ? ?  Malignant neoplasm of right lung ([place]) ?Followed by oncologist Dr. Rogue Bussing and pulmonologist Dr. Patsey Berthold.  Had recent PET scan ? ?Essential hypertension ?Controlled.  Continue enalapril ? ?Atrial fibrillation ([place]) ?Currently in sinus rhythm ?Continue amiodarone and apixaban ? ?Hypomagnesemia ?IV repletion given in the ED ? ?COPD (chronic obstructive pulmonary disease) ([place]) ?Not acutely exacerbated ?Continue home inhalers with DuoNebs as needed ? ? ? ? ? ? ?Advance Care Planning:   Code Status: Prior  ? ?Consults: none ? ?Family Communication: daughter at bedside ? ?Severity of Illness: ?The appropriate patient status for this patient is INPATIENT. Inpatient status is judged to be reasonable and necessary in order to provide the required intensity of service to ensure the patient's safety. The patient's presenting symptoms, physical exam findings, and initial radiographic and laboratory data in the context of their chronic comorbidities is felt to place them at high risk for  further clinical deterioration. Furthermore, it is not anticipated that the patient will be medically stable for discharge from the hospital within 2 midnights of admission.  ? ?* I certify that at the poin

## 2021-07-29 NOTE — ED Notes (Signed)
Blood cultures and full rainbow with lactic on ice sent to lab.  ?

## 2021-07-29 NOTE — Assessment & Plan Note (Signed)
Fever, vomiting and diarrhea x2 days abdominal pain, presumed infectious ?Received cefepime and vancomycin in the ED for possible sepsis of unknown source ?We will continue antibiotics for possible intra-abdominal infection ?IV fluids, IV antiemetics and supportive care ?Stool studies ?

## 2021-07-29 NOTE — Assessment & Plan Note (Signed)
Followed by oncologist Dr. Rogue Bussing and pulmonologist Dr. Patsey Berthold.  Had recent PET scan ?

## 2021-07-29 NOTE — ED Notes (Signed)
Pt attempted to obtain urine at this time. Urine cup was given to patient. Pt states she dropped the urine cup containing urine sample. This nurse advised pt to let them know when nursing staff can obtain the next sample ?

## 2021-07-29 NOTE — Assessment & Plan Note (Signed)
Likely reactive.  Patient denies pain ?

## 2021-07-29 NOTE — Assessment & Plan Note (Addendum)
Suspect secondary to alcohol use and hepatic steatosis.  Continue to monitor ?Can consider acute hepatitis panel if not improving ?

## 2021-07-29 NOTE — ED Notes (Signed)
Attempted for 20g at L fa.  ?

## 2021-07-29 NOTE — Assessment & Plan Note (Signed)
Secondary to dehydration from vomiting and diarrhea ?IV hydration and monitor ?

## 2021-07-29 NOTE — ED Triage Notes (Signed)
Per pt's spouse pt has been having diarrhea and vomiting. Per spouse pt does drink ETOH, pt states she has been too weak to stand. Pt states last etoh 2 days pta.  ?

## 2021-07-29 NOTE — Assessment & Plan Note (Signed)
IV repletion given in the ED ?

## 2021-07-29 NOTE — Progress Notes (Signed)
Pt being followed by [facility] for Sepsis protocol. 

## 2021-07-29 NOTE — ED Provider Notes (Signed)
? ?[facility] ?Provider Note ? ? ? Event Date/Time  ? First MD Initiated Contact with Patient 07/29/21 0108   ?  (approximate) ? ? ?History  ? ?Weakness ? ? ?HPI ? ?Anne Wells is a 71 y.o. female with past medical history of COPD, lung cancer recently diagnosed not yet started on treatment atrial fibrillation who presents with fever and weakness. ?Patient has not had much appetite for the last week.  Today has felt generally weak.  Was not aware of fever at home.  She has not had cough or congestion notes that her breathing is at its baseline.  She had 2 episodes of nonbloody diarrhea denies abdominal pain or vomiting.  Denies headache body aches.  Patient's husband denies any confusion.  Patient does drink alcohol but not daily.  Did not drink today.  Denies neck pain or visual change.  No skin lesions denies urinary symptoms.  No sick contacts. ? ?Past Medical History:  ?Diagnosis Date  ? COPD (chronic obstructive pulmonary disease) ([place])   ? ? ?Patient Active Problem List  ? Diagnosis Date Noted  ? Chronic anticoagulation   ? Hyponatremia   ? Acute gastroenteritis   ? Elevated lipase   ? Alcohol use disorder   ? Malignant neoplasm of right lung ([place]) 07/17/2021  ? Atrial fibrillation with RVR ([place]) 02/21/2021  ? COPD (chronic obstructive pulmonary disease) ([place]) 02/21/2021  ? Chest pain 02/21/2021  ? Hypokalemia 02/21/2021  ? Hypomagnesemia 02/21/2021  ? Atrial fibrillation ([place]) 02/21/2021  ? Elevated troponin 02/21/2021  ? GERD (gastroesophageal reflux disease) 02/21/2021  ? Fall at home, initial encounter 02/21/2021  ? Nodule of upper lobe of right lung 02/21/2021  ? Transaminitis 03/14/2020  ? Essential hypertension 12/27/2017  ? OSA (obstructive sleep apnea) 12/25/2016  ? ? ? ?Physical Exam  ?Triage Vital Signs: ?ED Triage Vitals [07/29/21 0101]  ?Enc Vitals Group  ?   BP 130/77  ?   Pulse Rate 91  ?   Resp (!) 22  ?   Temp (!) 103.1 ?F (39.5 ?C)  ?   Temp Source Oral  ?   SpO2 92  %  ?   Weight 200 lb (90.7 kg)  ?   Height 5\' 8"  (1.727 m)  ?   Head Circumference   ?   Peak Flow   ?   Pain Score 0  ?   Pain Loc   ?   Pain Edu?   ?   Excl. in [place]?   ? ? ?Most recent vital signs: ?Vitals:  ? 07/29/21 0232 07/29/21 0301  ?BP: 120/78   ?Pulse: 76   ?Resp: (!) 22   ?Temp:  100.2 ?F (37.9 ?C)  ?SpO2: 100%   ? ? ? ?General: Awake, no distress.  ?CV:  Good peripheral perfusion.  No lower extremity edema ?Resp:  Normal effort.  Decreased air movement throughout ?Abd:  No distention.  Abdomen is soft and nontender ?Neuro:             Awake, Alert, Oriented x 3  ?Other:  Shotty anterior cervical lymphadenopathy, no meningismus, moist mucous membranes ? ? ?ED Results / Procedures / Treatments  ?Labs ?(all labs ordered are listed, but only abnormal results are displayed) ?Labs Reviewed  ?COMPREHENSIVE METABOLIC PANEL - Abnormal; Notable for the following components:  ?    Result Value  ? Sodium 128 (*)   ? Potassium 2.7 (*)   ? Chloride 88 (*)   ? Glucose,  Bld 144 (*)   ? AST 237 (*)   ? ALT 232 (*)   ? All other components within normal limits  ?CBC WITH DIFFERENTIAL/PLATELET - Abnormal; Notable for the following components:  ? Platelets 102 (*)   ? Lymphs Abs 0.3 (*)   ? All other components within normal limits  ?PROTIME-INR - Abnormal; Notable for the following components:  ? Prothrombin Time 16.7 (*)   ? INR 1.4 (*)   ? All other components within normal limits  ?MAGNESIUM - Abnormal; Notable for the following components:  ? Magnesium 1.5 (*)   ? All other components within normal limits  ?LIPASE, BLOOD - Abnormal; Notable for the following components:  ? Lipase 57 (*)   ? All other components within normal limits  ?TROPONIN I (HIGH SENSITIVITY) - Abnormal; Notable for the following components:  ? Troponin I (High Sensitivity) 22 (*)   ? All other components within normal limits  ?RESP PANEL BY RT-PCR (FLU A&B, COVID) [id number]  ?CULTURE, BLOOD (ROUTINE X 2)  ?CULTURE, BLOOD (ROUTINE X 2)  ?URINE CULTURE   ?LACTIC ACID, PLASMA  ?PROCALCITONIN  ?LACTIC ACID, PLASMA  ?URINALYSIS, ROUTINE W REFLEX MICROSCOPIC  ?HEPATITIS PANEL, ACUTE  ?TROPONIN I (HIGH SENSITIVITY)  ? ? ? ?EKG ? ?EKG interpretation performed by myself: NSR, nml axis, prolonged QT, no acute ischemic changes ? ? ? ?RADIOLOGY ?I reviewed the CXR which does not show any acute cardiopulmonary process; agree with radiology report  ? ? ? ?PROCEDURES: ? ?Critical Care performed: Yes, see critical care procedure note(s) ? ?Procedures ? ?The patient is on the cardiac monitor to evaluate for evidence of arrhythmia and/or significant heart rate changes. ? ? ?MEDICATIONS ORDERED IN ED: ?Medications  ?magnesium sulfate IVPB 2 g 50 mL (has no administration in time range)  ?potassium chloride SA (KLOR-CON M) CR tablet 40 mEq (has no administration in time range)  ?potassium chloride 10 mEq in 100 mL IVPB (has no administration in time range)  ?ceFEPIme (MAXIPIME) 2 g in sodium chloride 0.9 % 100 mL IVPB (has no administration in time range)  ?metroNIDAZOLE (FLAGYL) IVPB 500 mg (has no administration in time range)  ?vancomycin (VANCOCIN) IVPB 1000 mg/200 mL premix (has no administration in time range)  ?acetaminophen (TYLENOL) tablet 1,000 mg (1,000 mg Oral Given 07/29/21 0150)  ?sodium chloride 0.9 % bolus 1,000 mL (0 mLs Intravenous Stopped 07/29/21 0359)  ? ? ? ?IMPRESSION / MDM / ASSESSMENT AND PLAN / ED COURSE  ?I reviewed the triage vital signs and the nursing notes. ?             ?               ? ?Differential diagnosis includes, but is not limited to, UTI, pneumonia, viral syndrome, hepatitis, bacteremia, less likely meningitis encephalitis ? ?The patient is a 71 year old female presents with generalized weakness and several episodes of diarrhea today.  She is febrile to 103 mildly tachypneic but vital signs otherwise are within normal limits.  She does not have much in the way of localizing symptoms.  No shortness of breath cough abdominal pain dysuria.  She  is not altered not confused.  Patient appears quite well on exam she has no meningismus does have some shotty anterior cervical lymphadenopathy.  Abdomen is benign, does have some diminished lung sounds.  Chest x-ray does not show any obvious pneumonia.  Labs are also overall reassuring, leukocytosis or anemia.  She is mildly hyponatremic with a sodium of  128 and mildly hypokalemic and hypomagnesemic.  Will supplement.  Troponin elevated at 22.  EKG does not show ischemic changes she is without chest pain suspect this is demand related.  Her AST and ALT are also elevated, this is new for her.  237 and 230.  Patient does drink alcohol although they are not in the 2:1 ratio would typically suspect.  Lipase is normal.  Obtained a right upper quadrant ultrasound which does not show any obvious signs of cholecystitis.  Patient without abdominal tenderness otherwise do not think she requires CT at this time.  Still pending a UA.  At this point source somewhat unclear.  Hepatitis panel was sent off.  We will cover broadly with cefepime Vanco and Flagyl.  My suspicion for meningitis is quite low given she is not altered without meningismus.  Discussed with the hospitalist for admission. ?Clinical Course as of 07/29/21 0428  ?Sat Jul 29, 2021  ?0120 Troponin I (High Sensitivity) [KM]  ?  ?Clinical Course User Index ?[KM] Rada Hay, MD  ? ? ? ?FINAL CLINICAL IMPRESSION(S) / ED DIAGNOSES  ? ?Final diagnoses:  ?Sepsis, due to unspecified organism, unspecified whether acute organ dysfunction present [facility])  ?Hypokalemia  ?Hypomagnesemia  ? ? ? ?Rx / DC Orders  ? ?ED Discharge Orders   ? ? None  ? ?  ? ? ? ?Note:  This document was prepared using Dragon voice recognition software and may include unintentional dictation errors. ?  ?Rada Hay, MD ?07/29/21 [phone] ? ?

## 2021-07-29 NOTE — Assessment & Plan Note (Signed)
Patient had no alcohol in the past 2 days secondary to vomiting ?CIWA withdrawal protocol ?

## 2021-07-29 NOTE — ED Notes (Signed)
EKG completed during triage per pt. EKG stickers were all over pt's chest and abdomen from triage.  ?

## 2021-07-29 NOTE — ED Notes (Signed)
Pt reports she is normally in low 90s and uses O2 at home as needed when laying down. Placed on 2L. 93% RA.  ?

## 2021-07-29 NOTE — Progress Notes (Signed)
This is a no charge note as patient was admitted this AM.  Patient seen and examined H&P and reviewed ? ? Anne Wells is a 71 y.o. female with medical history significant for Paroxysmal A-fib on amiodarone and Eliquis, HTN, COPD, right upper lobe lung nodule with PET scan suggesting malignant neoplasm, not a candidate for biopsy per review of pulmonology notes, who presents to the ED with a 2-day history of vomiting and diarrhea as well as fever. ?Patient still having diarrhea.  No abdominal pain.  No other new complaints ? ?Cta no w/r ?Reg s1/s2 no gallop ?Soft benign +bs ?No edema ? ?K 3.0 ? ? ?A/P ?Stool panel pending ?We will supplement with potassium ?

## 2021-07-30 DIAGNOSIS — K2900 Acute gastritis without bleeding: Secondary | ICD-10-CM | POA: Diagnosis present

## 2021-07-30 DIAGNOSIS — K529 Noninfective gastroenteritis and colitis, unspecified: Secondary | ICD-10-CM | POA: Diagnosis not present

## 2021-07-30 LAB — BASIC METABOLIC PANEL
Anion gap: 9 (ref 5–15)
BUN: 12 mg/dL (ref 8–23)
CO2: 24 mmol/L (ref 22–32)
Calcium: 8.6 mg/dL — ABNORMAL LOW (ref 8.9–10.3)
Chloride: 101 mmol/L (ref 98–111)
Creatinine, Ser: 0.64 mg/dL (ref 0.44–1.00)
GFR, Estimated: >60 mL/min (ref >60)
Glucose, Bld: 103 mg/dL — ABNORMAL HIGH (ref 70–99)
Potassium: 3.4 mmol/L — ABNORMAL LOW (ref 3.5–5.1)
Sodium: 134 mmol/L — ABNORMAL LOW (ref 135–145)

## 2021-07-30 LAB — GLUCOSE, CAPILLARY
Glucose-Capillary: 98 mg/dL (ref 70–99)
Glucose-Capillary: 131 mg/dL — ABNORMAL HIGH (ref 70–99)

## 2021-07-30 LAB — MAGNESIUM: Magnesium: 1.8 mg/dL (ref 1.7–2.4)

## 2021-07-30 MED ORDER — RISAQUAD PO CAPS
2.0000 | ORAL_CAPSULE | Freq: Two times a day (BID) | ORAL | Status: DC
  Administered 2021-07-30: 2 via ORAL
  Filled 2021-07-30: qty 2

## 2021-07-30 MED ORDER — RISAQUAD PO CAPS: 2.0000 | ORAL_CAPSULE | Freq: Two times a day (BID) | ORAL | 0 refills | Status: AC

## 2021-07-30 MED ORDER — POTASSIUM CHLORIDE CRYS ER 20 MEQ PO TBCR
40.0000 meq | EXTENDED_RELEASE_TABLET | Freq: Once | ORAL | Status: AC
  Administered 2021-07-30: 40 meq via ORAL
  Filled 2021-07-30: qty 2

## 2021-07-30 MED ORDER — THIAMINE HCL 100 MG PO TABS: 100.0000 mg | ORAL_TABLET | Freq: Every day | ORAL | 0 refills | Status: AC

## 2021-07-30 MED ORDER — FOLIC ACID 1 MG PO TABS: 1.0000 mg | ORAL_TABLET | Freq: Every day | ORAL | 0 refills | Status: AC

## 2021-07-30 NOTE — Progress Notes (Signed)
PT AVS reviewed and pt verbalized understanding of all dc teaching and instructions. IV removed without complications. Pt left with husband as transportation.All pt belongings are in pt possession.  ?

## 2021-07-30 NOTE — Care Management CC44 (Signed)
Condition Code 44 Documentation Completed ? ?Patient Details  ?Name: Anne Wells ?MRN: [id number] ?Date of Birth: 1951/02/27 ? ? ?Condition Code 44 given:  Yes ?Patient signature on Condition Code 44 notice:  Yes ?Documentation of 2 MD's agreement:  Yes ?Code 44 added to claim:  Yes ? ? ? ?Burgess Sheriff E Galia Rahm, LCSW ?07/30/2021, 3:17 PM ? ?

## 2021-07-30 NOTE — Discharge Summary (Signed)
Anne Wells NLZ:[id number] DOB: Oct 19, 1950 DOA: 07/29/2021 ? ?PCP: Donnie Coffin, MD ? ?Admit date: 07/29/2021 ?Discharge date: 07/30/2021 ? ?Admitted From: home ?Disposition:  home ? ?Recommendations for Outpatient Follow-up:  ?Follow up with PCP in 1 week ?Please obtain BMP/CBC in one week ? ? ? ?Discharge Condition:Stable ?CODE STATUS:full  ?Diet recommendation: Heart Healthy ?Brief/Interim Summary: ?Per TKW:IOXBDZ Anne Wells Anne Wells is a 71 y.o. female with medical history significant for Paroxysmal A-fib on amiodarone and Eliquis, HTN, COPD, right upper lobe lung nodule with PET scan suggesting malignant neoplasm, not a candidate for biopsy per review of pulmonology notes, who presents to the ED with a 2-day history of vomiting and diarrhea as well as fever. ?She has no affected contacts.  Has decreased oral intake.  Has a history of alcohol use and last alcohol intake was 2 days prior.  Denies abdominal pain, cough or chest pain.  Feels very weak. ?ED course and data review: On arrival febrile to 103.1 and tachypneic to 22 with otherwise normal vitals WBC normal at 4100 with procalcitonin of 0.3 ? Labs otherwise significant for sodium 128, potassium 2.7, AST/ALT of 237/232.  Magnesium 1.5 and troponin 22.  COVID and flu negative. ? ?Acute gastroenteritis ?Came in with fever and diarrhea.  ?Only one BM today.  ?Stool GI panel negative ?Was hydrated. Initially started on iv abx empirically ?Vitals stable. ?Bcx pending. ? ? ? ?  ?Elevated lipase ?Likely reactive.   ?No clinically evidence of pancreatitis. ?  ?Hyponatremia ?Secondary to dehydration from vomiting and diarrhea ?Improved with hydration.  ?128>>>134 ? ? ? ?Transaminitis ?Suspect secondary to alcohol use and hepatic steatosis.   ?F/u with pcp for repeat labs and further management ?Pt was counseled to stop drinking ? ? ?Hypokalemia ?Secondary to GI losses.  ?replaced ?  ?Alcohol use disorder ?Was placed on CIWA protocal. ?Counseled to stop ?  ?Chronic  anticoagulation ?Continue apixaban ?  ?Malignant neoplasm of right lung ([place]) ?Followed by oncologist Dr. Rogue Bussing and pulmonologist Dr. Patsey Berthold.  Had recent PET scan ?  ?Essential hypertension ?Held meds due to bp on low to normotensive due diarrhea ?F/u with pcp for further management. ?Likely can resume when bp starrts rising ?  ?Atrial fibrillation (Anne Wells) ?Currently in sinus rhythm ?Continue amiodarone and apixaban ?  ?Hypomagnesemia ?IV repletion given  ? ?Thrombocytopenia ?Mild ?Likely 2/2 alcohol use and infection ?F/u pcp for repeat labs ?  ?COPD (chronic obstructive pulmonary disease) ([place]) ?Not acutely exacerbated ?Continue home inhalers  ? ?Discharge Diagnoses:  ?Principal Problem: ?  Acute gastroenteritis ?Active Problems: ?  Hypokalemia ?  Transaminitis ?  Hyponatremia ?  Elevated lipase ?  COPD (chronic obstructive pulmonary disease) (Anne Wells) ?  Hypomagnesemia ?  Atrial fibrillation (Anne Wells) ?  Essential hypertension ?  Malignant neoplasm of right lung (Anne Wells) ?  Chronic anticoagulation ?  Alcohol use disorder ?  Acute gastritis ? ? ? ?Discharge Instructions ? ?Discharge Instructions   ? ? Diet - low sodium heart healthy   Complete by: As directed ?  ? Discharge instructions   Complete by: As directed ?  ? Stop all alcohol use. ?Hydrate daily ?Follow up with your doctor next week about your cardiac /blood pressure meds as we had to hold them since you were dehydrated.  ?Get blood work done by pcp  ? Increase activity slowly   Complete by: As directed ?  ? ?  ? ?Allergies as of 07/30/2021   ? ?   Reactions  ? Shellfish Allergy Swelling  ?  Swelling of the tongue  ? Penicillin G Rash  ? ?  ? ?  ?Medication List  ?  ? ?STOP taking these medications   ? ?diltiazem 180 MG 24 hr capsule ?Commonly known as: CARDIZEM CD ?  ?enalapril 10 MG tablet ?Commonly known as: VASOTEC ?  ?meloxicam 7.5 MG tablet ?Commonly known as: MOBIC ?  ?metoprolol succinate 50 MG 24 hr tablet ?Commonly known as: TOPROL-XL ?  ?oxyCODONE 5 MG  immediate release tablet ?Commonly known as: Roxicodone ?  ? ?  ? ?TAKE these medications   ? ?Acetaminophen 8 Hour 650 MG CR tablet ?Generic drug: acetaminophen ?SMARTSIG:1-2 Tablet(s) By Mouth Every 8 Hours PRN ?  ?acidophilus Caps capsule ?Take 2 capsules by mouth 2 (two) times daily for 14 days. ?  ?acyclovir 400 MG tablet ?Commonly known as: ZOVIRAX ?Take 400 mg by mouth 2 (two) times daily. ?  ?albuterol (2.5 MG/3ML) 0.083% nebulizer solution ?Commonly known as: PROVENTIL ?Inhale 3 mLs into the lungs every 6 (six) hours as needed. ?  ?albuterol 108 (90 Base) MCG/ACT inhaler ?Commonly known as: VENTOLIN HFA ?Inhale 2 puffs into the lungs every 6 (six) hours as needed for wheezing or shortness of breath. ?  ?amiodarone 200 MG tablet ?Commonly known as: PACERONE ?Take by mouth 2 (two) times daily. ?  ?apixaban 5 MG Tabs tablet ?Commonly known as: ELIQUIS ?Take 1 tablet (5 mg total) by mouth 2 (two) times daily. ?  ?cyclobenzaprine 5 MG tablet ?Commonly known as: FLEXERIL ?Take 5 mg by mouth 2 (two) times daily. ?  ?folic acid 1 MG tablet ?Commonly known as: FOLVITE ?Take 1 tablet (1 mg total) by mouth daily. ?Start taking on: July 31, 2021 ?  ?gabapentin 300 MG capsule ?Commonly known as: NEURONTIN ?Take 2 capsules (600 mg total) by mouth 2 (two) times daily. ?  ?Multivitamin Adult (Minerals) Tabs ?Take 1 tablet by mouth daily. ?  ?omeprazole 20 MG capsule ?Commonly known as: PRILOSEC ?Take 20 mg by mouth daily. ?  ?Stiolto Respimat 2.5-2.5 MCG/ACT Aers ?Generic drug: Tiotropium Bromide-Olodaterol ?Inhale 2 Inhalers into the lungs daily at 12 noon. ?What changed: Another medication with the same name was removed. Continue taking this medication, and follow the directions you see here. ?  ?thiamine 100 MG tablet ?Take 1 tablet (100 mg total) by mouth daily. ?Start taking on: July 31, 2021 ?  ? ?  ? ? ?Allergies  ?Allergen Reactions  ? Shellfish Allergy Swelling  ?  Swelling of the tongue  ? Penicillin G Rash   ? ? ?Consultations: ? ? ? ?Procedures/Studies: ?DG Chest 2 View ? ?Result Date: 07/29/2021 ?CLINICAL DATA:  Sepsis EXAM: CHEST - 2 VIEW COMPARISON:  04/29/2021 FINDINGS: Emphysema with asymmetric bulla formation within the right apex. Bilobed nodule within the right upper lung zone is better seen on prior CT examination of 05/25/2021 and appears grossly unchanged. No superimposed acute pulmonary infiltrate. No pneumothorax or pleural effusion. Cardiac size is within normal limits. Small hiatal hernia. Pulmonary vascularity is normal. No acute bone abnormality. IMPRESSION: No radiographic evidence of acute cardiopulmonary disease. Emphysema. Stable right upper lobe pulmonary nodule. This was better assessed on prior PET CT examination 06/21/2021 Electronically Signed   By: Fidela Salisbury M.D.   On: 07/29/2021 02:33  ? ?MR Abdomen W Wo Contrast ? ?Result Date: 07/07/2021 ?CLINICAL DATA:  PET performed for an enlarging right upper lobe pulmonary nodule. This demonstrated hypermetabolism in the region of the caudate lobe. Rule out liver lesion. Concurrent left adrenal  nodule. EXAM: MRI ABDOMEN WITHOUT AND WITH CONTRAST TECHNIQUE: Multiplanar multisequence MR imaging of the abdomen was performed both before and after the administration of intravenous contrast. CONTRAST:  5mL GADAVIST GADOBUTROL 1 MMOL/ML IV SOLN COMPARISON:  Chest CT 05/25/2021.  PET 06/21/2021. FINDINGS: Portions of exam, including the in and out of phase and pre and postcontrast dynamics are mildly motion degraded. Lower chest: Normal heart size without pericardial or pleural effusion. A moderate hiatal hernia. Hepatobiliary: Mild hepatomegaly. No suspicious liver lesion, with special attention to the caudate lobe and region of hypermetabolism on prior PET. There may be a cavernous hemangioma within the right hepatic lobe on 36/18, as evidenced by vague heterogeneous portal venous phase hyperenhancement at 1.3 cm. Possible T2 hyperintensity in this  area. No restricted diffusion. Normal gallbladder, without biliary ductal dilatation. Pancreas:  Normal, without mass or ductal dilatation. Spleen:  Normal in size, without focal abnormality. Adrenals/Urinary Tract:

## 2021-07-30 NOTE — TOC CM/SW Note (Signed)
SA resources added to AVS. ? Oleh Genin, LCSW ?[phone] ? ?

## 2021-08-01 ENCOUNTER — Ambulatory Visit: Payer: Medicare Other

## 2021-08-03 ENCOUNTER — Ambulatory Visit: Payer: Medicare Other

## 2021-08-03 LAB — CULTURE, BLOOD (ROUTINE X 2)
Culture: NO GROWTH
Culture: NO GROWTH
Special Requests: ADEQUATE
Special Requests: ADEQUATE

## 2021-08-08 ENCOUNTER — Ambulatory Visit: Payer: Medicare Other

## 2021-08-08 ENCOUNTER — Encounter: Payer: Self-pay | Admitting: *Deleted

## 2021-08-08 NOTE — Progress Notes (Signed)
Spoke to patient on the phone today to follow up with regarding radiation treatments. Pt did not show for CT simulation. Pt stated that she has been thinking about radiation treatment and would like to discuss further with her husband today. Requested that I call her back tomorrow.  ?

## 2021-08-10 ENCOUNTER — Encounter: Payer: Self-pay | Admitting: *Deleted

## 2021-08-10 NOTE — Progress Notes (Signed)
Spoke with pt on 08/09/21 to determine if she has made a decision about starting radiation treatment. Pt stated that she is ready to reschedule her appts. Informed that will get her rescheduled for CT simulation that was previously missed on 07/13/21 and will call her back with appt details.  ? ?Attempted to contact pt on 08/10/21 with appt for CT simulation. Pt did not answer. Message left with patient regarding appt info and instructed to call back to confirm.  ?

## 2021-08-15 ENCOUNTER — Ambulatory Visit: Payer: Medicare Other

## 2021-08-18 ENCOUNTER — Ambulatory Visit
Admission: RE | Admit: 2021-08-18 | Discharge: 2021-08-18 | Disposition: A | Discharge status: Home / Self Care | Payer: Medicare Other | Source: Ambulatory Visit | Attending: Radiation Oncology | Admitting: Radiation Oncology

## 2021-08-18 ENCOUNTER — Encounter: Payer: Self-pay | Admitting: *Deleted

## 2021-08-18 DIAGNOSIS — R911 Solitary pulmonary nodule: Secondary | ICD-10-CM | POA: Insufficient documentation

## 2021-08-24 DIAGNOSIS — R911 Solitary pulmonary nodule: Secondary | ICD-10-CM | POA: Diagnosis not present

## 2021-08-29 ENCOUNTER — Encounter: Payer: Self-pay | Admitting: *Deleted

## 2021-08-29 ENCOUNTER — Ambulatory Visit
Admission: RE | Admit: 2021-08-29 | Discharge: 2021-08-29 | Disposition: A | Discharge status: Home / Self Care | Payer: Medicare Other | Source: Ambulatory Visit | Attending: Radiation Oncology | Admitting: Radiation Oncology

## 2021-08-29 ENCOUNTER — Other Ambulatory Visit: Payer: Self-pay

## 2021-08-29 DIAGNOSIS — R911 Solitary pulmonary nodule: Secondary | ICD-10-CM | POA: Diagnosis not present

## 2021-08-29 DIAGNOSIS — C3490 Malignant neoplasm of unspecified part of unspecified bronchus or lung: Secondary | ICD-10-CM

## 2021-08-29 LAB — RAD ONC ARIA SESSION SUMMARY
Course Elapsed Days: 0
Plan Fractions Treated to Date: 1
Plan Prescribed Dose Per Fraction: 12 Gy
Plan Total Fractions Prescribed: 5
Plan Total Prescribed Dose: 60 Gy
Reference Point Dosage Given to Date: 12 Gy
Reference Point Session Dosage Given: 12 Gy
Session Number: 1

## 2021-08-30 NOTE — Addendum Note (Signed)
Addended by: Telford Nab on: 08/30/2021 10:02 AM ? ? Modules accepted: Orders ? ?

## 2021-08-31 ENCOUNTER — Other Ambulatory Visit: Payer: Self-pay

## 2021-08-31 ENCOUNTER — Ambulatory Visit
Admission: RE | Admit: 2021-08-31 | Discharge: 2021-08-31 | Disposition: A | Discharge status: Home / Self Care | Payer: Medicare Other | Source: Ambulatory Visit | Attending: Radiation Oncology | Admitting: Radiation Oncology

## 2021-08-31 DIAGNOSIS — R911 Solitary pulmonary nodule: Secondary | ICD-10-CM | POA: Diagnosis not present

## 2021-08-31 LAB — RAD ONC ARIA SESSION SUMMARY
Course Elapsed Days: 2
Plan Fractions Treated to Date: 2
Plan Prescribed Dose Per Fraction: 12 Gy
Plan Total Fractions Prescribed: 5
Plan Total Prescribed Dose: 60 Gy
Reference Point Dosage Given to Date: 24 Gy
Reference Point Session Dosage Given: 12 Gy
Session Number: 2

## 2021-09-05 ENCOUNTER — Ambulatory Visit
Admission: RE | Admit: 2021-09-05 | Discharge: 2021-09-05 | Disposition: A | Discharge status: Home / Self Care | Payer: Medicare Other | Source: Ambulatory Visit | Attending: Radiation Oncology | Admitting: Radiation Oncology

## 2021-09-05 ENCOUNTER — Other Ambulatory Visit: Payer: Self-pay

## 2021-09-05 DIAGNOSIS — R911 Solitary pulmonary nodule: Secondary | ICD-10-CM | POA: Diagnosis not present

## 2021-09-05 LAB — RAD ONC ARIA SESSION SUMMARY
Course Elapsed Days: 7
Plan Fractions Treated to Date: 3
Plan Prescribed Dose Per Fraction: 12 Gy
Plan Total Fractions Prescribed: 5
Plan Total Prescribed Dose: 60 Gy
Reference Point Dosage Given to Date: 36 Gy
Reference Point Session Dosage Given: 12 Gy
Session Number: 3

## 2021-09-07 ENCOUNTER — Other Ambulatory Visit: Payer: Self-pay

## 2021-09-07 ENCOUNTER — Ambulatory Visit
Admission: RE | Admit: 2021-09-07 | Discharge: 2021-09-07 | Disposition: A | Discharge status: Home / Self Care | Payer: Medicare Other | Source: Ambulatory Visit | Attending: Radiation Oncology | Admitting: Radiation Oncology

## 2021-09-07 DIAGNOSIS — R911 Solitary pulmonary nodule: Secondary | ICD-10-CM | POA: Diagnosis not present

## 2021-09-07 LAB — RAD ONC ARIA SESSION SUMMARY
Course Elapsed Days: 9
Plan Fractions Treated to Date: 4
Plan Prescribed Dose Per Fraction: 12 Gy
Plan Total Fractions Prescribed: 5
Plan Total Prescribed Dose: 60 Gy
Reference Point Dosage Given to Date: 48 Gy
Reference Point Session Dosage Given: 12 Gy
Session Number: 4

## 2021-09-12 ENCOUNTER — Other Ambulatory Visit: Payer: Self-pay

## 2021-09-12 ENCOUNTER — Ambulatory Visit
Admission: RE | Admit: 2021-09-12 | Discharge: 2021-09-12 | Disposition: A | Discharge status: Home / Self Care | Payer: Medicare Other | Source: Ambulatory Visit | Attending: Radiation Oncology | Admitting: Radiation Oncology

## 2021-09-12 DIAGNOSIS — R911 Solitary pulmonary nodule: Secondary | ICD-10-CM | POA: Diagnosis not present

## 2021-09-12 LAB — RAD ONC ARIA SESSION SUMMARY
Course Elapsed Days: 14
Plan Fractions Treated to Date: 5
Plan Prescribed Dose Per Fraction: 12 Gy
Plan Total Fractions Prescribed: 5
Plan Total Prescribed Dose: 60 Gy
Reference Point Dosage Given to Date: 60 Gy
Reference Point Session Dosage Given: 12 Gy
Session Number: 5

## 2021-09-27 ENCOUNTER — Other Ambulatory Visit: Payer: Self-pay | Admitting: Pulmonary Disease

## 2021-10-11 ENCOUNTER — Telehealth: Payer: Self-pay | Admitting: Internal Medicine

## 2021-10-11 ENCOUNTER — Inpatient Hospital Stay: Payer: Medicare Other | Attending: Internal Medicine

## 2021-10-11 ENCOUNTER — Inpatient Hospital Stay: Payer: Medicare Other | Admitting: Internal Medicine

## 2021-10-11 NOTE — Telephone Encounter (Signed)
Called to reschedule missed appointment - left voicemail for patient asking for a call back to reschedule.

## 2021-10-12 ENCOUNTER — Ambulatory Visit
Admission: RE | Admit: 2021-10-12 | Discharge: 2021-10-12 | Disposition: A | Discharge status: Home / Self Care | Payer: Medicare Other | Source: Ambulatory Visit | Attending: Radiation Oncology | Admitting: Radiation Oncology

## 2021-10-12 ENCOUNTER — Other Ambulatory Visit: Payer: Self-pay | Admitting: *Deleted

## 2021-10-12 ENCOUNTER — Encounter: Payer: Self-pay | Admitting: Radiation Oncology

## 2021-10-12 VITALS — BP 136/98 | HR 81 | Temp 97.6°F | Resp 16 | Wt 200.0 lb

## 2021-10-12 DIAGNOSIS — R911 Solitary pulmonary nodule: Secondary | ICD-10-CM

## 2021-10-12 DIAGNOSIS — Z923 Personal history of irradiation: Secondary | ICD-10-CM | POA: Diagnosis not present

## 2021-10-12 NOTE — Progress Notes (Signed)
Radiation Oncology Follow up Note  Name: Anne Wells   Date:   10/12/2021 MRN:  [id number] DOB: Sep 12, 1950    This 71 y.o. 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  REFERRING PROVIDER: Donnie Coffin, MD  HPI: Patient is a 71 year old female now at 1 month of completed SBRT to right upper lobe for presumed stage I non-small cell lung cancer of the right upper lobe seen today in routine follow-up she is doing well specifically [place] any progressive cough hemoptysis chest tightness or dysphagia..  COMPLICATIONS OF TREATMENT: none  FOLLOW UP COMPLIANCE: keeps appointments   PHYSICAL EXAM:  BP (!) 136/98 (BP Location: Right Arm, Patient Position: Sitting)   Pulse 81   Temp 97.6 F (36.4 C) (Tympanic)   Resp 16   Wt 200 lb (90.7 kg)   SpO2 100%   BMI 29.97 kg/m  Well-developed well-nourished patient in NAD. HEENT reveals PERLA, EOMI, discs not visualized.  Oral cavity is clear. No oral mucosal lesions are identified. Neck is clear without evidence of cervical or supraclavicular adenopathy. Lungs are clear to A&P. Cardiac examination is essentially unremarkable with regular rate and rhythm without murmur rub or thrill. Abdomen is benign with no organomegaly or masses noted. Motor sensory and DTR levels are equal and symmetric in the upper and lower extremities. Cranial nerves II through XII are grossly intact. Proprioception is intact. No peripheral adenopathy or edema is identified. No motor or sensory levels are noted. Crude visual fields are within normal range.  RADIOLOGY RESULTS: CT scan of the chest ordered in 3 months  PLAN: Present time patient is doing well very low side effect profile from her SBRT.  Pleased with her overall progress.  Of asked to see her back in 3 months with a CT scan of the chest prior to that visit.  Patient comprehends my recommendations well.  Both  daughters were available for my meeting.  I would like to take this opportunity to thank you for allowing me to participate in the care of your patient.Noreene Filbert, MD

## 2021-12-27 ENCOUNTER — Ambulatory Visit
Admission: RE | Admit: 2021-12-27 | Discharge: 2021-12-27 | Disposition: A | Discharge status: Home / Self Care | Payer: Medicare Other | Source: Ambulatory Visit | Attending: Radiation Oncology | Admitting: Radiation Oncology

## 2021-12-27 DIAGNOSIS — R911 Solitary pulmonary nodule: Secondary | ICD-10-CM | POA: Insufficient documentation

## 2021-12-27 LAB — POCT I-STAT CREATININE: Creatinine, Ser: 1 mg/dL (ref 0.44–1.00)

## 2021-12-27 MED ORDER — IOHEXOL 300 MG/ML  SOLN
75.0000 mL | Freq: Once | INTRAMUSCULAR | Status: AC | PRN
  Administered 2021-12-27: 75 mL via INTRAVENOUS

## 2022-01-12 ENCOUNTER — Ambulatory Visit: Payer: Medicare Other | Admitting: Radiation Oncology

## 2022-01-15 ENCOUNTER — Ambulatory Visit
Admission: RE | Admit: 2022-01-15 | Discharge: 2022-01-15 | Disposition: A | Discharge status: Home / Self Care | Payer: Medicare Other | Source: Ambulatory Visit | Attending: Radiation Oncology | Admitting: Radiation Oncology

## 2022-01-15 ENCOUNTER — Other Ambulatory Visit: Payer: Self-pay | Admitting: *Deleted

## 2022-01-15 VITALS — BP 149/78 | HR 66 | Temp 95.6°F | Resp 18 | Ht 68.5 in | Wt 198.5 lb

## 2022-01-15 DIAGNOSIS — R911 Solitary pulmonary nodule: Secondary | ICD-10-CM | POA: Insufficient documentation

## 2022-01-15 DIAGNOSIS — Z923 Personal history of irradiation: Secondary | ICD-10-CM | POA: Insufficient documentation

## 2022-01-15 NOTE — Progress Notes (Signed)
Radiation Oncology Follow up Note  Name: Anne Wells   Date:   01/15/2022 MRN:  [id number] DOB: 05/17/1950    This 71 y.o. female presents to the clinic today for 59-month follow-up status post SBRT for presumed stage I non-small cell lung cancer of the right upper lobe.  REFERRING PROVIDER: Donnie Coffin, MD  HPI: Patient is a 71 year old female now out for months having completed SBRT to her right upper lobe for presumed stage I non-small cell lung cancer seen today in routine follow-up she is doing well.  She specifically denies cough hemoptysis or chest tightness or any change in her pulmonary status..  Recent CT scan of her chest shows minimal interval decrease in the size of the right upper lobe mass on my review consistent with radiation changes.  No other new or progressive findings on today's exam.  COMPLICATIONS OF TREATMENT: none  FOLLOW UP COMPLIANCE: keeps appointments   PHYSICAL EXAM:  BP (!) 149/78   Pulse 66   Temp (!) 95.6 F (35.3 C)   Resp 18   Ht 5' 8.5" (1.74 m)   Wt 198 lb 8 oz (90 kg)   BMI 29.74 kg/m  Well-developed well-nourished patient in NAD. HEENT reveals PERLA, EOMI, discs not visualized.  Oral cavity is clear. No oral mucosal lesions are identified. Neck is clear without evidence of cervical or supraclavicular adenopathy. Lungs are clear to A&P. Cardiac examination is essentially unremarkable with regular rate and rhythm without murmur rub or thrill. Abdomen is benign with no organomegaly or masses noted. Motor sensory and DTR levels are equal and symmetric in the upper and lower extremities. Cranial nerves II through XII are grossly intact. Proprioception is intact. No peripheral adenopathy or edema is identified. No motor or sensory levels are noted. Crude visual fields are within normal range.  RADIOLOGY RESULTS: CT scans reviewed compatible with above-stated findings  PLAN: Present time patient is doing well.  Changes in the right upper lobe are  consistent with radiation change from SBRT.  And pleased with her overall progress and low side effect profile.  I have asked to see her back in 6 months for follow-up with a follow-up CT scan at that time.  Patient is to call with any concerns.  I would like to take this opportunity to thank you for allowing me to participate in the care of your patient.Noreene Filbert, MD

## 2022-02-26 ENCOUNTER — Emergency Department: Payer: Medicare Other

## 2022-02-26 ENCOUNTER — Emergency Department
Admission: EM | Admit: 2022-02-26 | Discharge: 2022-02-26 | Disposition: A | Discharge status: Home / Self Care | Payer: Medicare Other | Attending: Emergency Medicine | Admitting: Emergency Medicine

## 2022-02-26 ENCOUNTER — Other Ambulatory Visit: Payer: Self-pay

## 2022-02-26 DIAGNOSIS — Z7901 Long term (current) use of anticoagulants: Secondary | ICD-10-CM | POA: Diagnosis not present

## 2022-02-26 DIAGNOSIS — R42 Dizziness and giddiness: Secondary | ICD-10-CM | POA: Diagnosis not present

## 2022-02-26 DIAGNOSIS — Z20822 Contact with and (suspected) exposure to covid-19: Secondary | ICD-10-CM | POA: Insufficient documentation

## 2022-02-26 DIAGNOSIS — N179 Acute kidney failure, unspecified: Secondary | ICD-10-CM | POA: Insufficient documentation

## 2022-02-26 DIAGNOSIS — Z85118 Personal history of other malignant neoplasm of bronchus and lung: Secondary | ICD-10-CM | POA: Insufficient documentation

## 2022-02-26 DIAGNOSIS — R55 Syncope and collapse: Secondary | ICD-10-CM | POA: Diagnosis not present

## 2022-02-26 DIAGNOSIS — D649 Anemia, unspecified: Secondary | ICD-10-CM | POA: Insufficient documentation

## 2022-02-26 DIAGNOSIS — E876 Hypokalemia: Secondary | ICD-10-CM | POA: Diagnosis not present

## 2022-02-26 DIAGNOSIS — J449 Chronic obstructive pulmonary disease, unspecified: Secondary | ICD-10-CM | POA: Diagnosis not present

## 2022-02-26 LAB — BASIC METABOLIC PANEL
Anion gap: 13 (ref 5–15)
BUN: 21 mg/dL (ref 8–23)
CO2: 30 mmol/L (ref 22–32)
Calcium: 9.5 mg/dL (ref 8.9–10.3)
Chloride: 94 mmol/L — ABNORMAL LOW (ref 98–111)
Creatinine, Ser: 1.51 mg/dL — ABNORMAL HIGH (ref 0.44–1.00)
GFR, Estimated: 37 mL/min — ABNORMAL LOW (ref >60)
Glucose, Bld: 102 mg/dL — ABNORMAL HIGH (ref 70–99)
Potassium: 2.9 mmol/L — ABNORMAL LOW (ref 3.5–5.1)
Sodium: 137 mmol/L (ref 135–145)

## 2022-02-26 LAB — URINALYSIS, ROUTINE W REFLEX MICROSCOPIC
Bacteria, UA: NONE SEEN
Bilirubin Urine: NEGATIVE
Glucose, UA: NEGATIVE mg/dL
Hgb urine dipstick: NEGATIVE
Ketones, ur: NEGATIVE mg/dL
Nitrite: NEGATIVE
Protein, ur: NEGATIVE mg/dL
Specific Gravity, Urine: 1.023 (ref 1.005–1.030)
pH: 6 (ref 5.0–8.0)

## 2022-02-26 LAB — BLOOD GAS, VENOUS
Acid-Base Excess: 12.3 mmol/L — ABNORMAL HIGH (ref 0.0–2.0)
Bicarbonate: 36.7 mmol/L — ABNORMAL HIGH (ref 20.0–28.0)
O2 Saturation: 93.4 %
Patient temperature: 37
pCO2, Ven: 45 mmHg (ref 44–60)
pH, Ven: 7.52 — ABNORMAL HIGH (ref 7.25–7.43)
pO2, Ven: 64 mmHg — ABNORMAL HIGH (ref 32–45)

## 2022-02-26 LAB — RESP PANEL BY RT-PCR (FLU A&B, COVID) ARPGX2
Influenza A by PCR: NEGATIVE
Influenza B by PCR: NEGATIVE
SARS Coronavirus 2 by RT PCR: NEGATIVE

## 2022-02-26 LAB — CBC
HCT: 36 % (ref 36.0–46.0)
Hemoglobin: 11.1 g/dL — ABNORMAL LOW (ref 12.0–15.0)
MCH: 30.7 pg (ref 26.0–34.0)
MCHC: 30.8 g/dL (ref 30.0–36.0)
MCV: 99.7 fL (ref 80.0–100.0)
Platelets: 109 10*3/uL — ABNORMAL LOW (ref 150–400)
RBC: 3.61 MIL/uL — ABNORMAL LOW (ref 3.87–5.11)
RDW: 16 % — ABNORMAL HIGH (ref 11.5–15.5)
WBC: 3.4 10*3/uL — ABNORMAL LOW (ref 4.0–10.5)
nRBC: 0 % (ref 0.0–0.2)

## 2022-02-26 LAB — MAGNESIUM: Magnesium: 1.5 mg/dL — ABNORMAL LOW (ref 1.7–2.4)

## 2022-02-26 LAB — TROPONIN I (HIGH SENSITIVITY)
Troponin I (High Sensitivity): 9 ng/L (ref <18)
Troponin I (High Sensitivity): 7 ng/L (ref <18)

## 2022-02-26 MED ORDER — POTASSIUM CHLORIDE CRYS ER 20 MEQ PO TBCR
40.0000 meq | EXTENDED_RELEASE_TABLET | Freq: Once | ORAL | Status: AC
  Administered 2022-02-26: 40 meq via ORAL
  Filled 2022-02-26: qty 2

## 2022-02-26 MED ORDER — ALUM & MAG HYDROXIDE-SIMETH 200-200-20 MG/5ML PO SUSP
30.0000 mL | Freq: Once | ORAL | Status: AC
  Administered 2022-02-26: 30 mL via ORAL
  Filled 2022-02-26: qty 30

## 2022-02-26 MED ORDER — MAGNESIUM SULFATE 2 GM/50ML IV SOLN
2.0000 g | Freq: Once | INTRAVENOUS | Status: AC
  Administered 2022-02-26: 2 g via INTRAVENOUS
  Filled 2022-02-26: qty 50

## 2022-02-26 MED ORDER — IOHEXOL 350 MG/ML SOLN
60.0000 mL | Freq: Once | INTRAVENOUS | Status: AC | PRN
  Administered 2022-02-26: 60 mL via INTRAVENOUS

## 2022-02-26 MED ORDER — MAGNESIUM 30 MG PO TABS: 30.0000 mg | ORAL_TABLET | Freq: Every day | ORAL | 0 refills | Status: AC

## 2022-02-26 MED ORDER — SODIUM CHLORIDE 0.9 % IV BOLUS
1000.0000 mL | Freq: Once | INTRAVENOUS | Status: AC
  Administered 2022-02-26: 1000 mL via INTRAVENOUS

## 2022-02-26 MED ORDER — POTASSIUM CHLORIDE CRYS ER 20 MEQ PO TBCR: 20.0000 meq | EXTENDED_RELEASE_TABLET | Freq: Two times a day (BID) | ORAL | 0 refills | Status: DC

## 2022-02-26 NOTE — ED Notes (Signed)
Patient passed swallow screen - confirmed with MD that its OK for patient to eat and drink.

## 2022-02-26 NOTE — ED Triage Notes (Signed)
Patient arrived via POV from home stating that last week she got dizzy and fell and hit her head, denies LOC, states she takes blood thinners but cannot recall which one. AOX4, ambulatory on arrival with clear speech. Reports mild headache, dizziness and states she intermittently sees black spots. Reports mild shortness of breath, denies chest pain. No focal weakness or facial droop.

## 2022-02-26 NOTE — ED Notes (Signed)
EDP Wong in room at this time.

## 2022-02-26 NOTE — ED Notes (Signed)
Pt to MRI

## 2022-02-26 NOTE — ED Notes (Signed)
Ambulates with standby assistance - steady gait noted. Returns to bed without problem.   Sent UA

## 2022-02-26 NOTE — ED Provider Notes (Signed)
Patient received in signout from Dr. Jacelyn Grip pending neuroimaging and further observation and IV fluids.  Imaging without evidence of acute abnormality not consistent with CVA.  She does not have any focal deficits.  Her unsteadiness and symptoms have significantly improved after IV hydration and electrolyte repletion.  Do suspect she is mildly dehydrated given her mild AKI.  Discussed option for observation the hospital for additional management patient family feel comfortable patient going home.  She is tolerating p.o.  She is not orthostatic.  Denies any pain or discomfort at this time.  Does appear appropriate for further work-up as an outpatient.   Anne Lot, MD 02/26/22 1145

## 2022-02-26 NOTE — ED Notes (Signed)
Patient BP dropped from sitting to standing approximately 15 pts - notified provider.   PT reports she feels fine with standing and denies dizziness.

## 2022-02-26 NOTE — ED Notes (Signed)
MD at bedside for evaluation - pt reporting indigestion after eating [place] breakfast sandwich - notified provider.

## 2022-02-26 NOTE — ED Provider Notes (Signed)
[facility] Provider Note    Event Date/Time   First MD Initiated Contact with Patient 02/26/22 [phone]     (approximate)   History   Dizziness   HPI  Anne Wells is a 71 y.o. female   Past medical history of atrial fibrillation on Eliquis and amiodarone, COPD, lung cancer on radiation therapy only, presents to the emergency department with syncopal event last week and a fall with head strike, ongoing dizziness since then.  Last week she was in her regular state of health with no medical complaints when she used the toilet to urinate and upon standing and bending over to turn off her fish tank, she felt lightheaded and passed out, striking her head, no seizure activity, back to baseline within seconds, helped to her feet by her husband.  Since then she has been feeling dizzy, lightheaded, black dots in her vision occasionally for the past several days.  She has been able to perform activities of daily living but must walk more carefully due to lightheadedness.  Review of systems not very much revealing, she denies chest pain, shortness of breath, new cough, motor or sensory complaints, urinary symptoms, abdominal pain nausea or vomiting.  P.o. intake has been normal.  History was obtained via the patient. Dependent historian includes her husband who is at bedside offering collateral information. I reviewed an external medical note discharge summary dated 07/30/2021 when she was admitted to the hospital for electrolyte disturbances in the setting of acute gastroenteritis.      Physical Exam   Triage Vital Signs: ED Triage Vitals [02/26/22 0453]  Enc Vitals Group     BP 113/79     Pulse Rate 74     Resp (!) 24     Temp 98.5 F (36.9 C)     Temp Source Oral     SpO2 100 %     Weight 200 lb (90.7 kg)     Height 5\' 8"  (1.727 m)     Head Circumference      Peak Flow      Pain Score 2     Pain Loc      Pain Edu?      Excl. in [place]?     Most recent  vital signs: Vitals:   02/26/22 0453 02/26/22 0613  BP: 113/79 112/75  Pulse: 74 70  Resp: (!) 24 20  Temp: 98.5 F (36.9 C)   SpO2: 100% 96%    General: Awake, no distress.  CV:  Good peripheral perfusion.  Radial pulses intact and equal bilaterally. Resp:  Normal effort.  No wheezes, rales, focalities.  Comfortable. Abd:  No distention.  Nontender to deep palpation all quadrants. Other:  No obvious trauma to the head noted.  Neck is supple with full range of motion, she is moving all extremities with full active range of motion.  She has no focal neurological deficits including facial asymmetry, field cuts, extraocular movements are intact, no motor or sensory deficits, and she was ambulatory with a slow shuffling gait.   ED Results / Procedures / Treatments   Labs (all labs ordered are listed, but only abnormal results are displayed) Labs Reviewed  BASIC METABOLIC PANEL - Abnormal; Notable for the following components:      Result Value   Potassium 2.9 (*)    Chloride 94 (*)    Glucose, Bld 102 (*)    Creatinine, Ser 1.51 (*)    GFR, Estimated 37 (*)  All other components within normal limits  CBC - Abnormal; Notable for the following components:   WBC 3.4 (*)    RBC 3.61 (*)    Hemoglobin 11.1 (*)    RDW 16.0 (*)    Platelets 109 (*)    All other components within normal limits  BLOOD GAS, VENOUS - Abnormal; Notable for the following components:   pH, Ven 7.52 (*)    pO2, Ven 64 (*)    Bicarbonate 36.7 (*)    Acid-Base Excess 12.3 (*)    All other components within normal limits  URINALYSIS, ROUTINE W REFLEX MICROSCOPIC  MAGNESIUM  TROPONIN I (HIGH SENSITIVITY)  TROPONIN I (HIGH SENSITIVITY)     I reviewed labs and they are notable for hypokalemia 2.9, mild anemia hemoglobin of 11.1, AKI with a creatinine 1.5 from baseline of 0.6  EKG  ED ECG REPORT I, Lucillie Garfinkel, the attending physician, personally viewed and interpreted this ECG.   Date: 02/26/2022   EKG Time: 0456  Rate: 72  Rhythm: normal sinus rhythm  Axis: nl  Intervals:long Qtc 507  ST&T Change: no ischemic changes    RADIOLOGY I independently reviewed and interpreted chest x-ray reveals right upper lobe opacity that is interpreted by formal radiology as chronic and stable from September CT   PROCEDURES:  Critical Care performed: No  Procedures   MEDICATIONS ORDERED IN ED: Medications  iohexol (OMNIPAQUE) 350 MG/ML injection 60 mL (has no administration in time range)  sodium chloride 0.9 % bolus 1,000 mL (has no administration in time range)  potassium chloride SA (KLOR-CON M) CR tablet 40 mEq (has no administration in time range)     IMPRESSION / MDM / ASSESSMENT AND PLAN / ED COURSE  I reviewed the triage vital signs and the nursing notes.                              Differential diagnosis includes, but is not limited to, CVA, intracranial bleeding, electrolyte disturbance, ACS, dysrhythmia, infection, peripheral vertigo   The patient is on the cardiac monitor to evaluate for evidence of arrhythmia and/or significant heart rate changes.  MDM: Broad differential diagnosis for dizziness in this 71 year old patient with atrial fibrillation on Eliquis who also fell and hit her head 1 week ago.  Obtain CT and CTA of the head and neck to assess for stroke or intracranial bleeding.  Check basic labs and infectious work-up including chest x-ray and urinalysis for electrolyte disturbances or infection.  Check EKG and troponins for ACS or dysrhythmia.  If work-up above is negative, given patient's ongoing dizziness and unstable gait that is changed from prior, would proceed with MRI of the brain.  Thus far is remarkable for AKI, creatinine is 1.5 from baseline 0.6; slightly anemic 11.1 compared to a baseline of 12.7 on prior testing earlier in 2023, given she has not significantly hemoconcentrated despite her AKI I checked a rectal exam which was guaiac negative in case of  blood loss anemia causing AKI and symptoms.  She has not reported any blood loss, with negative rectal I doubt this is the cause.  Remainder of work-up is pending including head imaging and infectious work-up.  I ordered potassium repletion for hypokalemia as well as an IV crystalloid bolus.  If the remainder of work-up is negative, I anticipate she will be admitted for AKI causing profound symptoms unless on reassessment her symptoms have completely resolved with emergency department treatment.  Signed out to oncoming provider for ongoing evaluation, and reassessment of symptoms after treatment.  Patient's presentation is most consistent with acute presentation with potential threat to life or bodily function.       FINAL CLINICAL IMPRESSION(S) / ED DIAGNOSES   Final diagnoses:  Dizziness  Syncope and collapse  AKI (acute kidney injury) ([place])  Hypokalemia     Rx / DC Orders   ED Discharge Orders     None        Note:  This document was prepared using Dragon voice recognition software and may include unintentional dictation errors.    Lucillie Garfinkel, MD 02/26/22 [phone]

## 2022-02-26 NOTE — ED Notes (Signed)
Pt leaves with family - VSS at discharge. Verbalizes understanding of all discharge instructions, wheelchair to lobby with family.

## 2022-02-27 ENCOUNTER — Other Ambulatory Visit: Payer: Self-pay | Admitting: Pulmonary Disease

## 2022-05-22 ENCOUNTER — Ambulatory Visit: Payer: 59 | Admitting: Cardiovascular Disease

## 2022-05-30 ENCOUNTER — Other Ambulatory Visit: Payer: Self-pay | Admitting: Pulmonary Disease

## 2022-05-31 ENCOUNTER — Other Ambulatory Visit: Payer: Self-pay | Admitting: Cardiovascular Disease

## 2022-05-31 ENCOUNTER — Ambulatory Visit (INDEPENDENT_AMBULATORY_CARE_PROVIDER_SITE_OTHER): Payer: 59 | Admitting: Cardiovascular Disease

## 2022-05-31 ENCOUNTER — Encounter: Payer: Self-pay | Admitting: Cardiovascular Disease

## 2022-05-31 VITALS — BP 130/80 | HR 95 | Ht 68.5 in | Wt 193.4 lb

## 2022-05-31 DIAGNOSIS — R0602 Shortness of breath: Secondary | ICD-10-CM

## 2022-05-31 DIAGNOSIS — I10 Essential (primary) hypertension: Secondary | ICD-10-CM | POA: Diagnosis not present

## 2022-05-31 DIAGNOSIS — I480 Paroxysmal atrial fibrillation: Secondary | ICD-10-CM | POA: Diagnosis not present

## 2022-05-31 NOTE — Progress Notes (Addendum)
Cardiology Office Note   Date:  06/01/2022   ID:  Anne Wells, DOB 19-Jul-1950, MRN [id number]  PCP:  Donnie Coffin, MD  Cardiologist:  Neoma Laming, MD      History of Present Illness: Anne Wells is a 72 y.o. female who presents for  Chief Complaint  Patient presents with   Follow-up    2 month follow up and discuss echo results.    Shortness of Breath This is a chronic problem. The current episode started 1 to 4 weeks ago. The problem occurs every several days. The problem has been unchanged.      Past Medical History:  Diagnosis Date   COPD (chronic obstructive pulmonary disease) ([place])      Past Surgical History:  Procedure Laterality Date   Dental procedure       Current Outpatient Medications  Medication Sig Dispense Refill   ACETAMINOPHEN 8 HOUR 650 MG CR tablet SMARTSIG:1-2 Tablet(s) By Mouth Every 8 Hours PRN     acyclovir (ZOVIRAX) 400 MG tablet Take 400 mg by mouth 2 (two) times daily.     albuterol (VENTOLIN HFA) 108 (90 Base) MCG/ACT inhaler INHALE TWO (2) PUFFS BY MOUTH EVERY 6 HOURS AS NEEDED FOR WHEEZING OR FOR SHORTNESS OF BREATH 8.5 g 10   amiodarone (PACERONE) 200 MG tablet Take by mouth 2 (two) times daily.     cyclobenzaprine (FLEXERIL) 5 MG tablet Take 5 mg by mouth 2 (two) times daily.     diltiazem (CARDIZEM CD) 180 MG 24 hr capsule Take 180 mg by mouth 2 (two) times daily.     enalapril (VASOTEC) 10 MG tablet Take 10 mg by mouth daily.     folic acid (FOLVITE) 1 MG tablet Take 1 mg by mouth daily.     metoprolol succinate (TOPROL-XL) 25 MG 24 hr tablet Take 25 mg by mouth daily.     Multiple Vitamins-Minerals (MULTIVITAMIN ADULT, MINERALS,) TABS Take 1 tablet by mouth daily.     omeprazole (PRILOSEC) 20 MG capsule Take 20 mg by mouth daily.     Tiotropium Bromide-Olodaterol (STIOLTO RESPIMAT) 2.5-2.5 MCG/ACT AERS Inhale 2 Inhalers into the lungs daily at 12 noon.     albuterol (PROVENTIL) (2.5 MG/3ML) 0.083% nebulizer solution  Inhale 3 mLs into the lungs every 6 (six) hours as needed.     apixaban (ELIQUIS) 5 MG TABS tablet Take 1 tablet (5 mg total) by mouth 2 (two) times daily. 180 tablet 0   gabapentin (NEURONTIN) 300 MG capsule Take 2 capsules (600 mg total) by mouth 2 (two) times daily. 120 capsule 2   potassium chloride SA (KLOR-CON M) 20 MEQ tablet Take 1 tablet (20 mEq total) by mouth 2 (two) times daily for 5 days. 10 tablet 0   No current facility-administered medications for this visit.    Allergies:   Shellfish allergy and Penicillin g    Social History:   reports that she quit smoking about 18 years ago. Her smoking use included cigarettes. She has a 17.00 pack-year smoking history. She has never used smokeless tobacco. She reports current alcohol use. She reports that she does not use drugs.   Family History:  family history includes Bone cancer in her brother and mother; COPD in her brother and father.    ROS:     Review of Systems  Constitutional: Negative.   HENT: Negative.    Eyes: Negative.   Respiratory:  Positive for shortness of breath.   Gastrointestinal: Negative.  Genitourinary: Negative.   Musculoskeletal: Negative.   Skin: Negative.   Neurological: Negative.   Endo/Heme/Allergies: Negative.   Psychiatric/Behavioral: Negative.    All other systems reviewed and are negative.     All other systems are reviewed and negative.    PHYSICAL EXAM: VS:  BP 130/80   Pulse 95   Ht 5' 8.5" (1.74 m)   Wt 193 lb 6.4 oz (87.7 kg)   SpO2 91%   BMI 28.98 kg/m  , BMI Body mass index is 28.98 kg/m. Last weight:  Wt Readings from Last 3 Encounters:  05/31/22 193 lb 6.4 oz (87.7 kg)  02/26/22 200 lb (90.7 kg)  01/15/22 198 lb 8 oz (90 kg)     Physical Exam Constitutional:      Appearance: Normal appearance.  Cardiovascular:     Rate and Rhythm: Normal rate and regular rhythm.     Heart sounds: Normal heart sounds.  Pulmonary:     Effort: Pulmonary effort is normal.      Breath sounds: Normal breath sounds.  Musculoskeletal:     Right lower leg: No edema.     Left lower leg: No edema.  Neurological:     Mental Status: She is alert.       EKG:   Recent Labs: 07/29/2021: ALT 232 02/26/2022: Hemoglobin 11.1; Magnesium 1.5; Platelets 109 05/31/2022: BUN 15; Creatinine, Ser 0.73; Potassium 3.6; Sodium 142    Lipid Panel    Component Value Date/Time   CHOL 355 (H) 02/22/2021 0403   TRIG 51 02/22/2021 0403   HDL 179 02/22/2021 0403   CHOLHDL 2.0 02/22/2021 0403   VLDL 10 02/22/2021 0403   LDLCALC 166 (H) 02/22/2021 0403      Other studies Reviewed: Additional studies/ records that were reviewed today include:  Review of the above records demonstrates:       No data to display            REASON FOR VISIT  Visit for: Echocardiogram/I42.9  Sex:   Female  wt=189    lbs.  BP=110/80  Height=68    inches.        TESTS  Imaging: Echocardiogram:  An echocardiogram in (2-d) mode was performed and in Doppler mode with color flow velocity mapping was performed. The aortic valve cusps are abnormal 1.0  cm, flow velocity 3.89  m/s, and systolic calculated mean flow gradient 6  mmHg. Mitral valve diastolic peak flow velocity E 1.27   m/s and E/A ratio 1.1. Aortic root diameter 3.4  cm. The LVOT internal diameter 3.3 cm and flow velocity was abnormal 1.30  m/s. LV systolic dimension 3.73  cm, diastolic 4.28 cm, posterior wall thickness 0.85   cm, fractional shortening 37.7  %, and EF 67.7 %. IVS thickness 0.771 cm. LA dimension 3.9 cm. Mitral Valve has Mild Regurgitation. Aortic Valve has Trace Regurgitation. Tricuspid Valve has Trace Regurgitation.     ASSESSMENT  Technically adequate study.  Normal chamber sizes.  Mild left ventricular hypertrophy with GRADE 1 (relaxation abnormality) diastolic dysfunction.  Normal right ventricular systolic function.  Normal right ventricular diastolic function.  Normal left ventricular wall  motion.  Normal right ventricular wall motion.  Trace tricuspid regurgitation.  Normal pulmonary artery pressure.  Mild mitral regurgitation.  Trace aortic regurgitation.  No pericardial effusion.  Mild LVH.     THERAPY   Referring physician: Dionisio David  Sonographer: Neoma Laming.   Neoma Laming MD  Electronically signed by: Neoma Laming  Date: 05/15/2022 13:25 TESTS                                                                                          [facility] [address] [place], [place] [address] [phone] STUDY:  Gated Stress / Rest Myocardial Perfusion Imaging Tomographic (SPECT) Including attenuation correction Wall Motion, Left Ventricular Ejection Fraction By Gated Technique.Treadmill Stress Test. SEX: Female  WEIGHT: 190 lbs  HEIGHT: 68 in    ARMS UP: YES/NO                                                                        REFERRING PHYSICIAN: Dr.Juliette Standre Humphrey Rolls                                                                                                                                                                                                                       INDICATION FOR STUDY: A-Fib  TECHNIQUE:  Approximately 20 minutes following the intravenous administration of 10.5 mCi of Tc-60m Sestamibi after stress testing in a reclined supine position with arms above their head if able to do so, gated SPECT imaging of the heart was performed. After about a 2hr break, the patient was injected intravenously with 31.6 mCi of Tc-74m Sestamibi.  Approximately 45 minutes later in the same position as stress imaging SPECT rest imaging of the heart was performed.  STRESS BY:  Neoma Laming, MD PROTOCOL: Darnell Level                                                                                          MAX PRED HR: 150                     85%: 128               75%: 113                                                                                                                   RESTING BP: 130/82   RESTING HR: 109 PEAK BP: 152/92  PEAK HR: 126 (84%)                                                                    EXERCISE DURATION: 2:57                                             METS: 4.6     REASON FOR TEST TERMINATION: SOB.  SYMPTOMS: SOB.  [place] TREADMILL SCORE: 3                                       RISK:  Moderate                                                                                                                                                                                                           EKG RESULTS: NSR. 92/min. PRWP. No significant ST changes at peak exercise.                                                              IMAGE QUALITY: Good  PERFUSION/WALL MOTION FINDINGS: EF = 65%. Small mild fixed basal, mid, and apical anterior wall defects, normal wall motion.                                                                         IMPRESSION: Probable normal stress test with normal LVEF, above defect most likely due to breast attenuation artifact.                                                                                                                                                                                                                                                                                          Neoma Laming, MD Stress Interpreting Physician / Nuclear Interpreting Physician                Neoma Laming MD  Electronically signed by: Neoma Laming     Date: 03/16/2021 10:10 ASSESSMENT AND PLAN:    ICD-10-CM   1. SOB (shortness of breath)  B15.17 Basic Metabolic Panel (BMET)    2. Paroxysmal atrial fibrillation (HCC)  O16.0 Basic Metabolic Panel (BMET)    3. Essential hypertension  V37 Basic Metabolic Panel (BMET)       Problem List Items Addressed This Visit       Cardiovascular and Mediastinum   Atrial fibrillation ([place])   Relevant Orders   Basic Metabolic Panel (BMET) (Completed)   Essential hypertension   Relevant Orders   Basic Metabolic Panel (BMET) (Completed)     Other   SOB (shortness of breath) - Primary    Echo showed diastolic dysfunction with LVEF 67%, trace AR, mild MR, will add aldactone as may have HEpEF, but creat 1.51 thus reluctant at this time      Relevant Orders   Basic Metabolic Panel (BMET) (Completed)     Disposition:   Return in about 4 weeks (around 06/28/2022).  Signed,  Neoma Laming, MD  06/01/2022 12:45 PM    [facility]

## 2022-05-31 NOTE — Assessment & Plan Note (Signed)
Echo showed diastolic dysfunction with LVEF 67%, trace AR, mild MR, will add aldactone as may have HEpEF, but creat 1.51 thus reluctant at this time

## 2022-06-01 LAB — BASIC METABOLIC PANEL
BUN/Creatinine Ratio: 21 (ref 12–28)
BUN: 15 mg/dL (ref 8–27)
CO2: 26 mmol/L (ref 20–29)
Calcium: 9.5 mg/dL (ref 8.7–10.3)
Chloride: 100 mmol/L (ref 96–106)
Creatinine, Ser: 0.73 mg/dL (ref 0.57–1.00)
Glucose: 100 mg/dL — ABNORMAL HIGH (ref 70–99)
Potassium: 3.6 mmol/L (ref 3.5–5.2)
Sodium: 142 mmol/L (ref 134–144)
eGFR: 87 mL/min/{1.73_m2} (ref >59)

## 2022-06-16 ENCOUNTER — Encounter: Payer: Self-pay | Admitting: Pulmonary Disease

## 2022-07-01 ENCOUNTER — Emergency Department: Payer: 59

## 2022-07-01 ENCOUNTER — Emergency Department
Admission: EM | Admit: 2022-07-01 | Discharge: 2022-07-01 | Disposition: A | Discharge status: Home / Self Care | Payer: 59 | Attending: Emergency Medicine | Admitting: Emergency Medicine

## 2022-07-01 ENCOUNTER — Other Ambulatory Visit: Payer: Self-pay

## 2022-07-01 DIAGNOSIS — D649 Anemia, unspecified: Secondary | ICD-10-CM | POA: Diagnosis not present

## 2022-07-01 DIAGNOSIS — I10 Essential (primary) hypertension: Secondary | ICD-10-CM | POA: Insufficient documentation

## 2022-07-01 DIAGNOSIS — Z7901 Long term (current) use of anticoagulants: Secondary | ICD-10-CM | POA: Insufficient documentation

## 2022-07-01 DIAGNOSIS — D72819 Decreased white blood cell count, unspecified: Secondary | ICD-10-CM | POA: Insufficient documentation

## 2022-07-01 DIAGNOSIS — R0789 Other chest pain: Secondary | ICD-10-CM | POA: Insufficient documentation

## 2022-07-01 DIAGNOSIS — J449 Chronic obstructive pulmonary disease, unspecified: Secondary | ICD-10-CM | POA: Insufficient documentation

## 2022-07-01 DIAGNOSIS — R079 Chest pain, unspecified: Secondary | ICD-10-CM | POA: Diagnosis present

## 2022-07-01 LAB — BASIC METABOLIC PANEL
Anion gap: 12 (ref 5–15)
BUN: 8 mg/dL (ref 8–23)
CO2: 26 mmol/L (ref 22–32)
Calcium: 8.4 mg/dL — ABNORMAL LOW (ref 8.9–10.3)
Chloride: 100 mmol/L (ref 98–111)
Creatinine, Ser: 0.62 mg/dL (ref 0.44–1.00)
GFR, Estimated: >60 mL/min (ref >60)
Glucose, Bld: 77 mg/dL (ref 70–99)
Potassium: 3.2 mmol/L — ABNORMAL LOW (ref 3.5–5.1)
Sodium: 138 mmol/L (ref 135–145)

## 2022-07-01 LAB — TROPONIN I (HIGH SENSITIVITY)
Troponin I (High Sensitivity): 10 ng/L (ref <18)
Troponin I (High Sensitivity): 10 ng/L (ref <18)

## 2022-07-01 LAB — CBC
HCT: 37.2 % (ref 36.0–46.0)
Hemoglobin: 11.3 g/dL — ABNORMAL LOW (ref 12.0–15.0)
MCH: 29.4 pg (ref 26.0–34.0)
MCHC: 30.4 g/dL (ref 30.0–36.0)
MCV: 96.9 fL (ref 80.0–100.0)
Platelets: 151 10*3/uL (ref 150–400)
RBC: 3.84 MIL/uL — ABNORMAL LOW (ref 3.87–5.11)
RDW: 16 % — ABNORMAL HIGH (ref 11.5–15.5)
WBC: 2.7 10*3/uL — ABNORMAL LOW (ref 4.0–10.5)
nRBC: 0.8 % — ABNORMAL HIGH (ref 0.0–0.2)

## 2022-07-01 LAB — PROTIME-INR
INR: 1.1 (ref 0.8–1.2)
Prothrombin Time: 13.9 s (ref 11.4–15.2)

## 2022-07-01 MED ORDER — HYDROCODONE-ACETAMINOPHEN 5-325 MG PO TABS: 1.0000 | ORAL_TABLET | Freq: Four times a day (QID) | ORAL | 0 refills | Status: DC | PRN

## 2022-07-01 MED ORDER — ACETAMINOPHEN 325 MG PO TABS
650.0000 mg | ORAL_TABLET | Freq: Once | ORAL | Status: AC
  Administered 2022-07-01: 650 mg via ORAL
  Filled 2022-07-01: qty 2

## 2022-07-01 MED ORDER — DOCUSATE SODIUM 100 MG PO CAPS: ORAL_CAPSULE | ORAL | 0 refills | Status: DC

## 2022-07-01 MED ORDER — OXYCODONE-ACETAMINOPHEN 5-325 MG PO TABS
1.0000 | ORAL_TABLET | Freq: Once | ORAL | Status: AC
  Administered 2022-07-01: 1 via ORAL
  Filled 2022-07-01: qty 1

## 2022-07-01 NOTE — ED Notes (Signed)
PT brought to ed rm 10 at this time, this RN now assuming care.  

## 2022-07-01 NOTE — ED Notes (Signed)
ED Provider at bedside. 

## 2022-07-01 NOTE — ED Provider Notes (Signed)
[facility] Provider Note    Event Date/Time   First MD Initiated Contact with Patient 07/01/22 [id number]     (approximate)   History   Chest Pain   HPI  Anne Wells is a 72 y.o. female whose medical history includes atrial fibrillation on Eliquis, COPD, and prior history of lung disease.  She presents by private vehicle for evaluation of about 2 weeks of right-sided chest pain which has gotten worse over the last few days.  She says she really notices tonight so she wanted get it checked out.  She cannot think of any 1 particular injury or incident that caused it, but she says she did some work around the house including some mopping of the floor that she was not used to doing and the pain started right after that.  It is highly reproducible when she moves around or when she presses on the right side of her chest wall.  She does not have any worsening shortness of breath than usual and has had no fevers or cough.  She denies nausea, vomiting, and abdominal pain.     Physical Exam   Triage Vital Signs: ED Triage Vitals  Enc Vitals Group     BP 07/01/22 0103 (!) 177/125     Pulse Rate 07/01/22 0103 (!) 103     Resp 07/01/22 0103 14     Temp 07/01/22 0058 98.2 F (36.8 C)     Temp Source 07/01/22 0058 Oral     SpO2 07/01/22 0103 94 %     Weight 07/01/22 0105 88 kg (194 lb)     Height 07/01/22 0105 1.727 m (5\' 8" )     Head Circumference --      Peak Flow --      Pain Score 07/01/22 0105 10     Pain Loc --      Pain Edu? --      Excl. in Lane? --     Most recent vital signs: Vitals:   07/01/22 0630 07/01/22 0700  BP: (!) 177/109 (!) 181/104  Pulse: 94 95  Resp: 15 (!) 23  Temp:    SpO2: 100% 99%     General: Awake, no distress.  CV:  Good peripheral perfusion.  Irregularly irregular rhythm with normal rate. Resp:  Normal effort. Speaking easily and comfortably, no accessory muscle usage nor intercostal retractions.  Lungs are clear to  auscultation.  She is on chronic oxygen. Abd:  No distention.  No tenderness to palpation. Other:  Highly reproducible right-sided chest wall tenderness to palpation.   ED Results / Procedures / Treatments   Labs (all labs ordered are listed, but only abnormal results are displayed) Labs Reviewed  BASIC METABOLIC PANEL - Abnormal; Notable for the following components:      Result Value   Potassium 3.2 (*)    Calcium 8.4 (*)    All other components within normal limits  CBC - Abnormal; Notable for the following components:   WBC 2.7 (*)    RBC 3.84 (*)    Hemoglobin 11.3 (*)    RDW 16.0 (*)    nRBC 0.8 (*)    All other components within normal limits  PROTIME-INR  TROPONIN I (HIGH SENSITIVITY)  TROPONIN I (HIGH SENSITIVITY)     EKG  ED ECG REPORT I, Hinda Kehr, the attending physician, personally viewed and interpreted this ECG.  Date: 07/01/2022 EKG Time: 1:01 AM Rate: 98 Rhythm: normal sinus rhythm QRS Axis:  normal Intervals: normal ST/T Wave abnormalities: Non-specific ST segment / T-wave changes, but no clear evidence of acute ischemia. Narrative Interpretation: no definitive evidence of acute ischemia; does not meet STEMI criteria.    RADIOLOGY I viewed and interpreted the patient's two-view chest x-ray and I see no evidence of lobar pneumonia or pneumothorax.  The radiologist commented on the changes consistent with bullous emphysema but no acute abnormalities.    PROCEDURES:  Critical Care performed: No  Procedures   MEDICATIONS ORDERED IN ED: Medications  oxyCODONE-acetaminophen (PERCOCET/ROXICET) 5-325 MG per tablet 1 tablet (1 tablet Oral Given 07/01/22 0659)  acetaminophen (TYLENOL) tablet 650 mg (650 mg Oral Given 07/01/22 0659)     IMPRESSION / MDM / ASSESSMENT AND PLAN / ED COURSE  I reviewed the triage vital signs and the nursing notes.                              Differential diagnosis includes, but is not limited to, musculoskeletal  strain, costochondritis, pneumonia, PE, abscess or cellulitis.  Patient's presentation is most consistent with acute presentation with potential threat to life or bodily function.  Labs/studies ordered: EKG, two-view chest x-ray, CBC, pro time-INR, BMP, high-sensitivity troponin x 2 Interventions/Medications given: 1 Percocet, acetaminophen 650 mg p.o. [facility] Course my include additional interventions or labs/studies not listed above.)  Patient is hypertensive but she has not had her antihypertensive medications recently and she is asymptomatic from that.  Vital signs otherwise stable.    The patient was on the cardiac monitor to evaluate for evidence of arrhythmia and/or significant heart rate changes.  As described above is very reassuring including 2 negative high-sensitivity troponins.  Her potassium is a little low but this is noncontributory.  She has mild leukopenia and very mild anemia but no evidence of thrombocytopenia.  EKG and chest x-ray are reassuring.  She has highly reproducible chest wall tenderness I believe this is primarily musculoskeletal.  There is no indication for additional evaluation at this time.  She already has establish care with a cardiologist and I encouraged her to follow-up with her regular doctor.  I gave her short course of pain medication as listed below after checking the [place] controlled substance database and not finding any concerning prescribing habits.  I encouraged her to also take ibuprofen if needed but I am aware that she is on Eliquis and she should not overdo the ibuprofen.  She understands and agrees with the plan and will follow-up with her regular doctor(s) at the next available opportunity.  I gave my usual and customary return precautions.      FINAL CLINICAL IMPRESSION(S) / ED DIAGNOSES   Final diagnoses:  Chest wall pain     Rx / DC Orders   ED Discharge Orders          Ordered    HYDROcodone-acetaminophen  (NORCO/VICODIN) 5-325 MG tablet  Every 6 hours PRN        07/01/22 0713    docusate sodium (COLACE) 100 MG capsule        07/01/22 [id number]             Note:  This document was prepared using Dragon voice recognition software and may include unintentional dictation errors.   Hinda Kehr, MD 07/01/22 [phone]

## 2022-07-01 NOTE — ED Triage Notes (Signed)
Reports chest pain for 2 weeks that got worse tonight. Patient states the pain is on her right side that moves to her back. Reports that she has never had pain like this before. Reports shortness of breath, nausea with vomiting twice.

## 2022-07-01 NOTE — Discharge Instructions (Addendum)
You have been seen in the Emergency Department (ED) today for chest pain.  As we have discussed today?s test results are normal, and we believe your pain is due to pain/strain and/or inflammation of the muscles and/or cartilage of your chest wall.  We recommend you take ibuprofen 600 mg three times a day with meals for the next 5 days (unless you have been told previously not to take ibuprofen or NSAIDs in general).  You may also take Tylenol according to the label instructions.  Read through the included information for additional treatment recommendations and precautions. ° °Continue to take your regular medications.  ° °Return to the Emergency Department (ED) if you experience any further chest pain/pressure/tightness, difficulty breathing, or sudden sweating, or other symptoms that concern you.  °

## 2022-07-05 ENCOUNTER — Ambulatory Visit: Payer: 59 | Admitting: Cardiovascular Disease

## 2022-07-13 ENCOUNTER — Ambulatory Visit: Admission: RE | Admit: 2022-07-13 | Payer: 59 | Source: Ambulatory Visit

## 2022-07-17 ENCOUNTER — Other Ambulatory Visit: Payer: Self-pay | Admitting: *Deleted

## 2022-07-17 DIAGNOSIS — R911 Solitary pulmonary nodule: Secondary | ICD-10-CM

## 2022-07-18 ENCOUNTER — Ambulatory Visit: Payer: 59 | Admitting: Radiation Oncology

## 2022-08-09 IMAGING — US US ABDOMEN LIMITED
1 series · 14 of 25 positions shown · non-contrast
Comparison: None.

CLINICAL DATA: Right upper quadrant pain x3 days.

EXAM:
ULTRASOUND ABDOMEN LIMITED RIGHT UPPER QUADRANT

[Series 1: us abdomen limited ruq (liver/gb) · 14 of 52 slices shown]
[im 1/52]
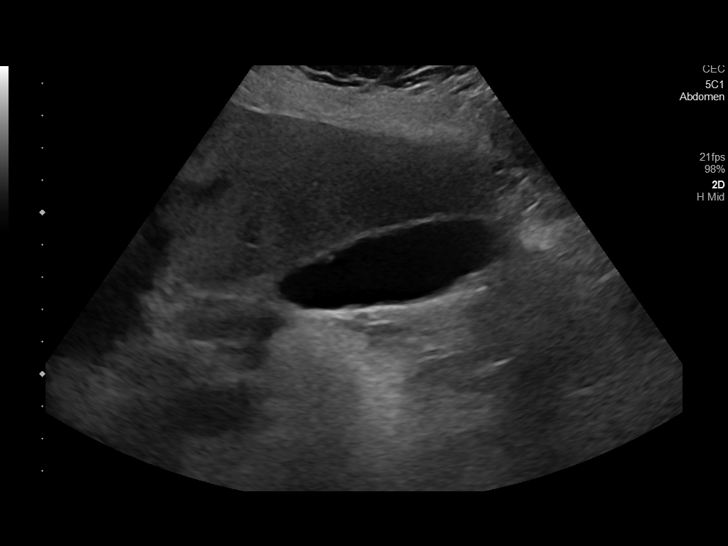
[im 5/52]
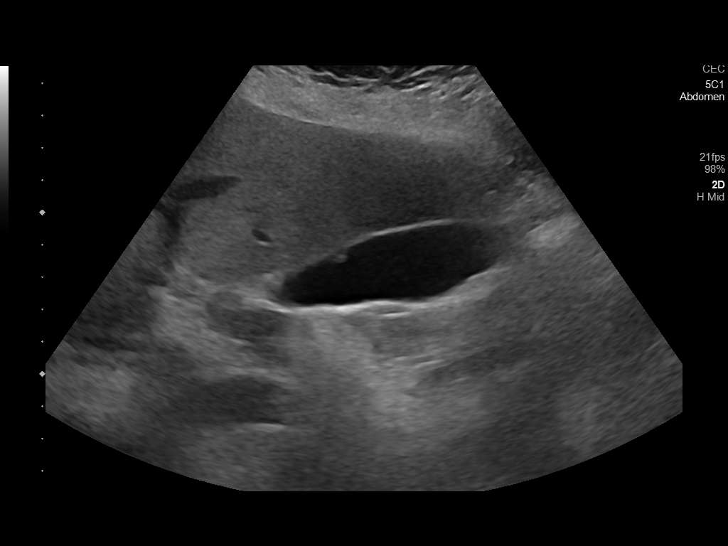
[im 9/52]
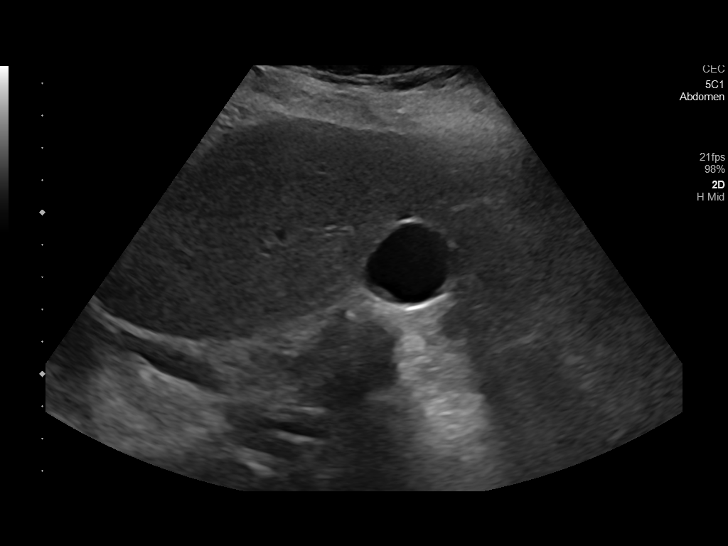
[im 13/52]
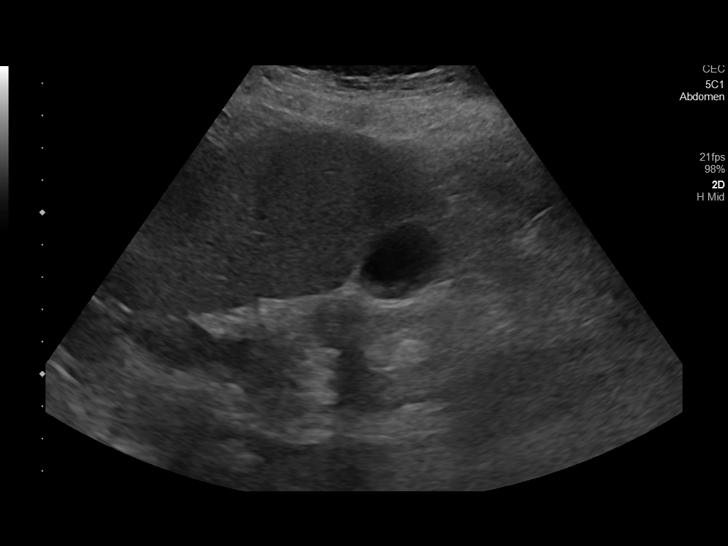
[im 18/52]
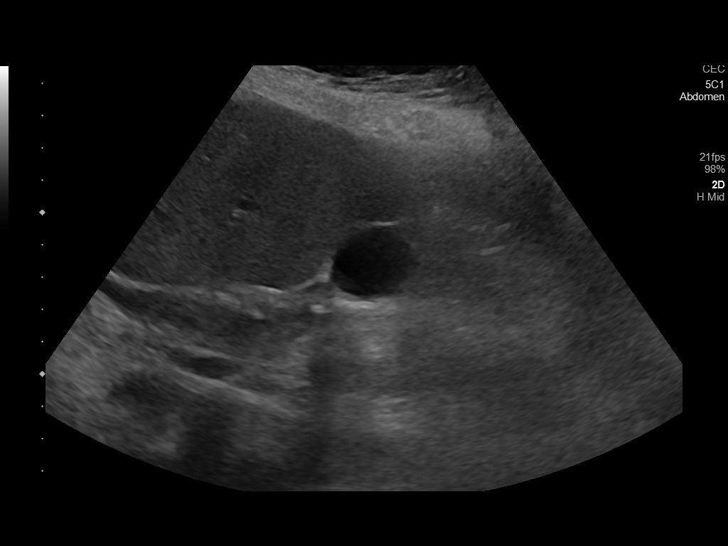
[im 20/52]
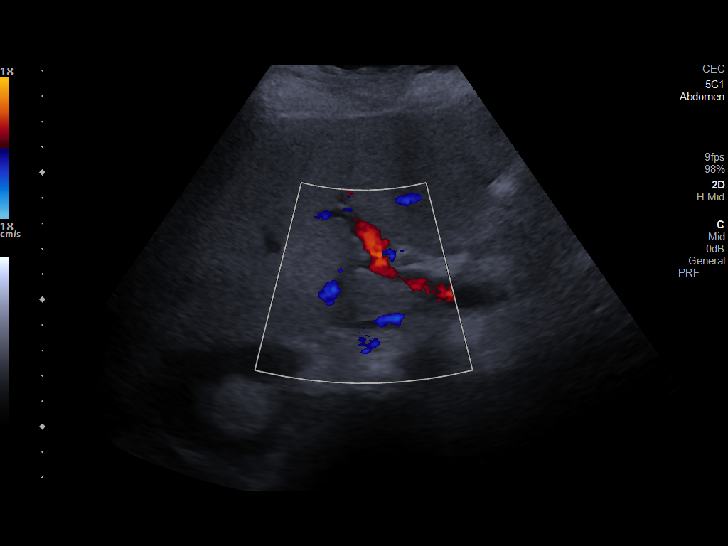
[im 24/52]
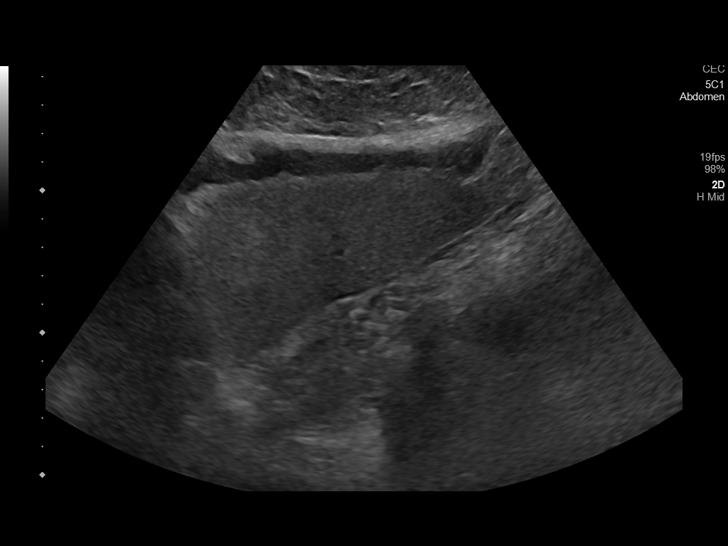
[im 28/52]
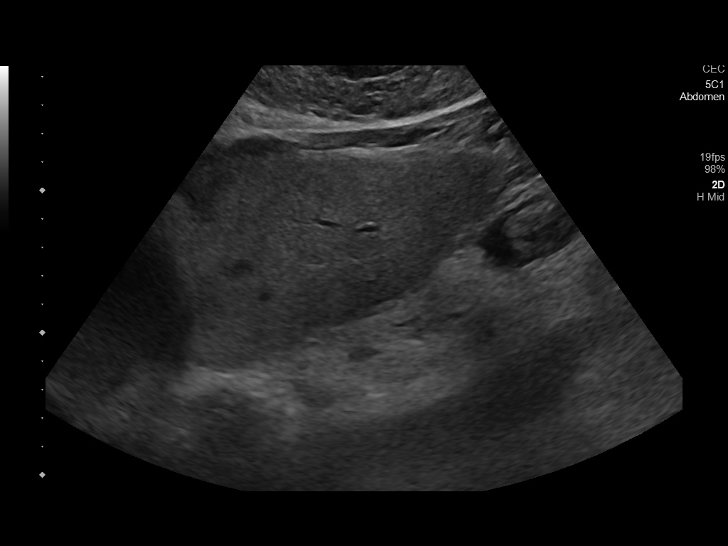
[im 32/52]
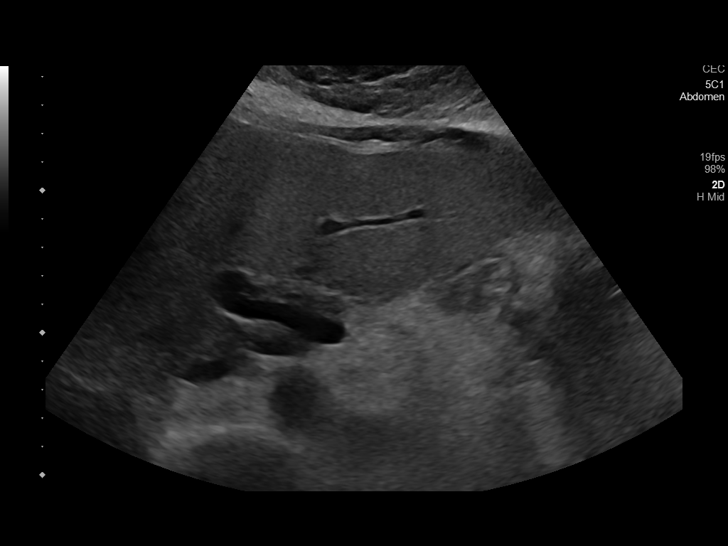
[im 35/52]
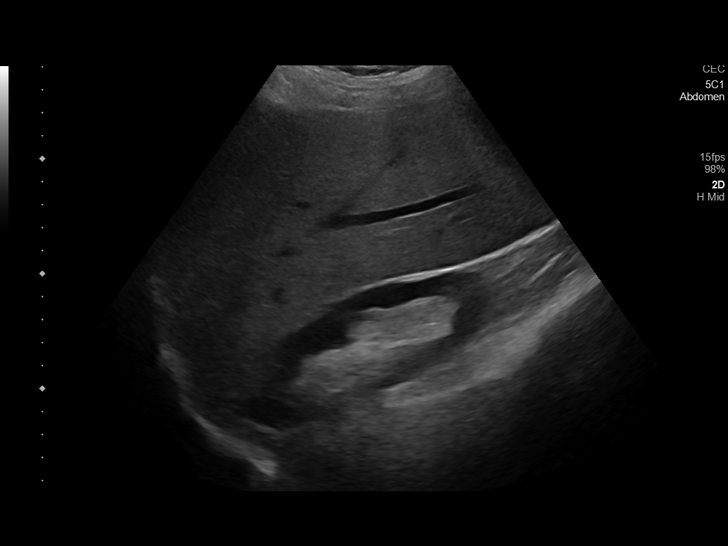
[im 39/52]
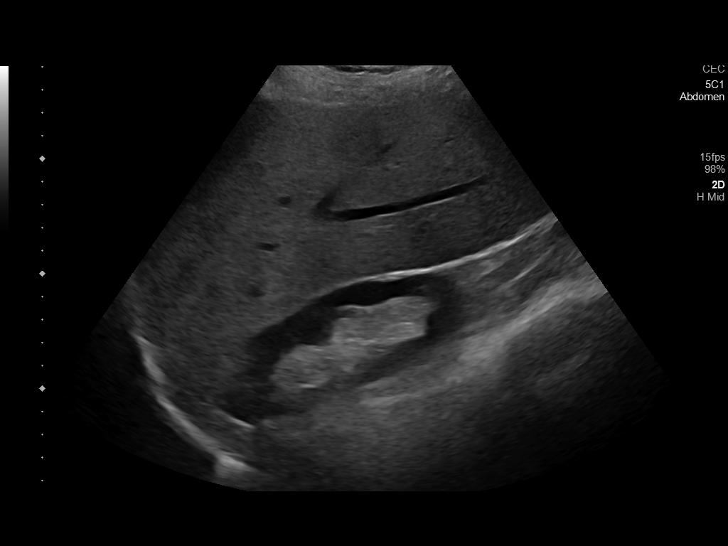
[im 43/52]
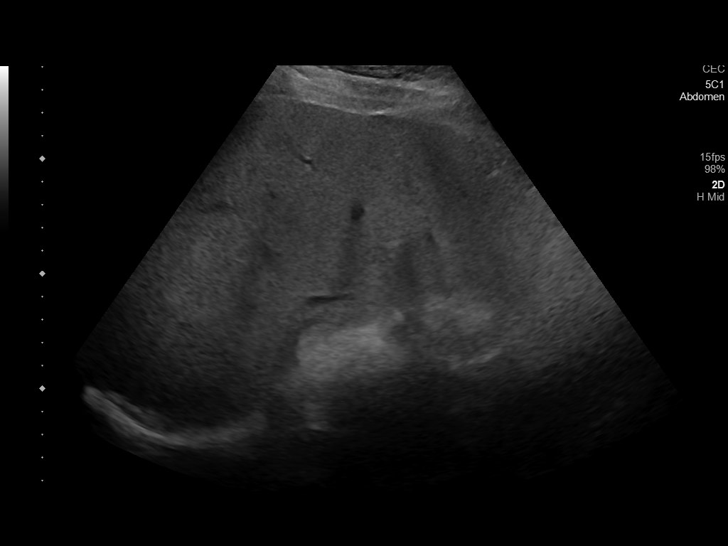
[im 47/52]
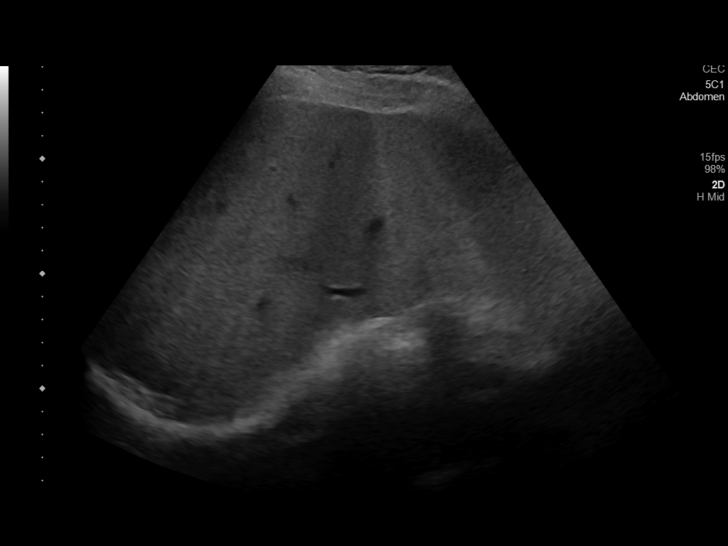
[im 52/52]
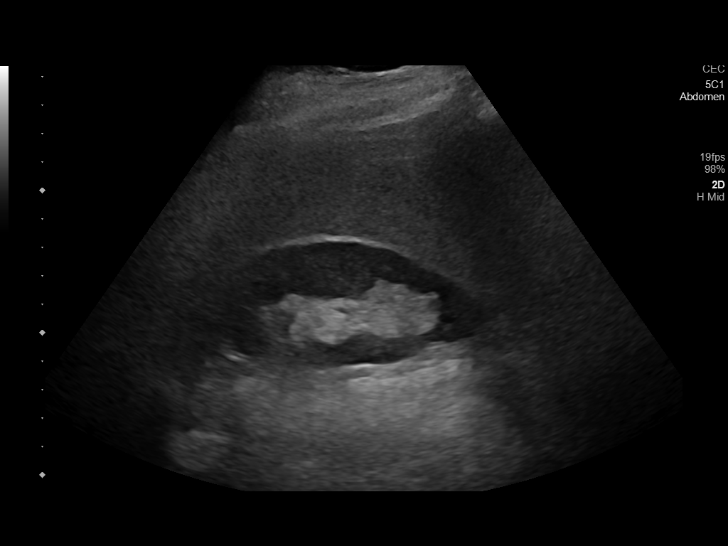

[14 of 25 positions shown; findings below may reference images not displayed]

FINDINGS: Gallbladder:

A 4.4 mm nonshadowing echogenic gallbladder polyp is seen along the
nondependent wall of the gallbladder lumen. A small amount of
echogenic gallbladder sludge is also seen. No gallstones or wall
thickening visualized (1.8 mm). No sonographic Murphy sign noted by
sonographer.

Common bile duct:

Diameter: 4.2 mm

Liver:

No focal lesion identified. Diffusely increased echogenicity of the
liver parenchyma is noted. Portal vein is patent on color Doppler
imaging with normal direction of blood flow towards the liver.

Other: None.
IMPRESSION: 1. 4.4 mm gallbladder polyp, likely benign. No additional follow-up
or imaging is recommended. This recommendation follows ACR consensus
guidelines: White Paper of the ACR Incidental Findings Committee II
on Gallbladder and Biliary Findings. [HOSPITAL]
2. Small amount of gallbladder sludge.
3. Hepatic steatosis without focal liver lesions.

## 2022-08-16 ENCOUNTER — Ambulatory Visit
Admission: RE | Admit: 2022-08-16 | Discharge: 2022-08-16 | Disposition: A | Discharge status: Home / Self Care | Payer: 59 | Source: Ambulatory Visit | Attending: Radiation Oncology | Admitting: Radiation Oncology

## 2022-08-16 DIAGNOSIS — R911 Solitary pulmonary nodule: Secondary | ICD-10-CM | POA: Diagnosis present

## 2022-08-16 MED ORDER — IOHEXOL 300 MG/ML  SOLN
75.0000 mL | Freq: Once | INTRAMUSCULAR | Status: AC | PRN
  Administered 2022-08-16: 75 mL via INTRAVENOUS

## 2022-08-20 ENCOUNTER — Ambulatory Visit
Admission: RE | Admit: 2022-08-20 | Discharge: 2022-08-20 | Disposition: A | Discharge status: Home / Self Care | Payer: 59 | Source: Ambulatory Visit | Attending: Radiation Oncology | Admitting: Radiation Oncology

## 2022-08-20 ENCOUNTER — Encounter: Payer: Self-pay | Admitting: Radiation Oncology

## 2022-08-20 ENCOUNTER — Other Ambulatory Visit: Payer: Self-pay | Admitting: *Deleted

## 2022-08-20 VITALS — BP 160/93 | HR 120 | Temp 97.2°F | Resp 20 | Wt 199.9 lb

## 2022-08-20 DIAGNOSIS — R911 Solitary pulmonary nodule: Secondary | ICD-10-CM | POA: Insufficient documentation

## 2022-08-20 DIAGNOSIS — Z923 Personal history of irradiation: Secondary | ICD-10-CM | POA: Diagnosis not present

## 2022-08-20 NOTE — Progress Notes (Signed)
Radiation Oncology Follow up Note  Name: Anne Wells   Date:   08/20/2022 MRN:  [id number] DOB: 12-25-1950    This 72 y.o. 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  REFERRING PROVIDER: Emogene Morgan, MD  HPI: Patient is a 72 year old female now out 10 months having completed SBRT to her right upper lobe for presumed stage I non-small cell lung cancer.  Seen today in routine follow-up she is doing fairly well though continues to have pulmonary symptoms of shortness of breath dyspnea on exertion mild cough.  No hemoptysis..  She had a recent CT scan which I have reviewed showing no evidence of progressive disease in her chest.  COMPLICATIONS OF TREATMENT: none  FOLLOW UP COMPLIANCE: keeps appointments   PHYSICAL EXAM:  BP (!) 160/93 Comment: patient advised to see PCP if BP remains high  Pulse (!) 120   Temp (!) 97.2 F (36.2 C) (Tympanic)   Resp 20   Wt 199 lb 14.4 oz (90.7 kg)   BMI 30.39 kg/m  Well-developed well-nourished patient in NAD. HEENT reveals PERLA, EOMI, discs not visualized.  Oral cavity is clear. No oral mucosal lesions are identified. Neck is clear without evidence of cervical or supraclavicular adenopathy. Lungs are clear to A&P. Cardiac examination is essentially unremarkable with regular rate and rhythm without murmur rub or thrill. Abdomen is benign with no organomegaly or masses noted. Motor sensory and DTR levels are equal and symmetric in the upper and lower extremities. Cranial nerves II through XII are grossly intact. Proprioception is intact. No peripheral adenopathy or edema is identified. No motor or sensory levels are noted. Crude visual fields are within normal range.  RADIOLOGY RESULTS: CT scans reviewed compatible with above-stated findings  PLAN: I am awaiting formal review of her CT scan although I believe there is no evidence of progression of  disease on my interpretation.  On pleased with her overall progress.  I am scheduling her with Dr. Jayme Wells for pulmonary tune up.  I have asked to see her back in 6 months with a follow-up CT scan at that time.  Patient is to call with any concerns.  I would like to take this opportunity to thank you for allowing me to participate in the care of your patient.Anne Miller, MD

## 2022-09-06 ENCOUNTER — Ambulatory Visit (INDEPENDENT_AMBULATORY_CARE_PROVIDER_SITE_OTHER): Payer: 59 | Admitting: Cardiovascular Disease

## 2022-09-06 ENCOUNTER — Encounter: Payer: Self-pay | Admitting: Cardiovascular Disease

## 2022-09-06 VITALS — BP 140/88 | HR 93 | Ht 68.5 in | Wt 202.4 lb

## 2022-09-06 DIAGNOSIS — I10 Essential (primary) hypertension: Secondary | ICD-10-CM

## 2022-09-06 DIAGNOSIS — I480 Paroxysmal atrial fibrillation: Secondary | ICD-10-CM | POA: Diagnosis not present

## 2022-09-06 DIAGNOSIS — R0602 Shortness of breath: Secondary | ICD-10-CM | POA: Diagnosis not present

## 2022-09-06 MED ORDER — DILTIAZEM HCL ER COATED BEADS 180 MG PO CP24: 180.0000 mg | ORAL_CAPSULE | Freq: Two times a day (BID) | ORAL | 0 refills | Status: DC

## 2022-09-06 MED ORDER — APIXABAN 5 MG PO TABS: 5.0000 mg | ORAL_TABLET | Freq: Two times a day (BID) | ORAL | 0 refills | Status: DC

## 2022-09-06 MED ORDER — APIXABAN 5 MG PO TABS: 5.0000 mg | ORAL_TABLET | Freq: Two times a day (BID) | ORAL | 1 refills | Status: DC

## 2022-09-06 MED ORDER — DILTIAZEM HCL ER COATED BEADS 180 MG PO CP24: 180.0000 mg | ORAL_CAPSULE | Freq: Two times a day (BID) | ORAL | 1 refills | Status: DC

## 2022-09-06 MED ORDER — ENALAPRIL MALEATE 10 MG PO TABS: 10.0000 mg | ORAL_TABLET | Freq: Every day | ORAL | 0 refills | Status: DC

## 2022-09-06 MED ORDER — AMIODARONE HCL 200 MG PO TABS: 200.0000 mg | ORAL_TABLET | Freq: Two times a day (BID) | ORAL | 0 refills | Status: DC

## 2022-09-06 MED ORDER — METOPROLOL SUCCINATE ER 25 MG PO TB24: 25.0000 mg | ORAL_TABLET | Freq: Every day | ORAL | 1 refills | Status: DC

## 2022-09-06 MED ORDER — ENALAPRIL MALEATE 10 MG PO TABS: 10.0000 mg | ORAL_TABLET | Freq: Every day | ORAL | 1 refills | Status: DC

## 2022-09-06 MED ORDER — AMIODARONE HCL 200 MG PO TABS: 200.0000 mg | ORAL_TABLET | Freq: Two times a day (BID) | ORAL | 1 refills | Status: DC

## 2022-09-06 MED ORDER — METOPROLOL SUCCINATE ER 25 MG PO TB24: 25.0000 mg | ORAL_TABLET | Freq: Every day | ORAL | 0 refills | Status: DC

## 2022-09-06 NOTE — Progress Notes (Signed)
Cardiology Office Note   Date:  09/06/2022   ID:  Kashonda, Kasparian 12-16-1950, MRN [id number]  PCP:  Emogene Morgan, MD  Cardiologist:  Adrian Blackwater, MD      History of Present Illness: Anne Wells is a 72 y.o. female who presents for  Chief Complaint  Patient presents with   Follow-up    Follow up    Patient in office for 3 month follow up. Denies chest pain. Reports shortness of breath unchanged. Patient reports being out of her medications for 2-3 weeks.     Past Medical History:  Diagnosis Date   COPD (chronic obstructive pulmonary disease) (HCC)      Past Surgical History:  Procedure Laterality Date   Dental procedure       Current Outpatient Medications  Medication Sig Dispense Refill   ACETAMINOPHEN 8 HOUR 650 MG CR tablet SMARTSIG:1-2 Tablet(s) By Mouth Every 8 Hours PRN     acyclovir (ZOVIRAX) 400 MG tablet Take 400 mg by mouth 2 (two) times daily.     albuterol (PROVENTIL) (2.5 MG/3ML) 0.083% nebulizer solution Inhale 3 mLs into the lungs every 6 (six) hours as needed.     albuterol (VENTOLIN HFA) 108 (90 Base) MCG/ACT inhaler INHALE TWO (2) PUFFS BY MOUTH EVERY 6 HOURS AS NEEDED FOR WHEEZING OR FOR SHORTNESS OF BREATH 8.5 g 10   cyclobenzaprine (FLEXERIL) 5 MG tablet Take 5 mg by mouth 2 (two) times daily.     folic acid (FOLVITE) 1 MG tablet Take 1 mg by mouth daily.     gabapentin (NEURONTIN) 300 MG capsule Take 2 capsules (600 mg total) by mouth 2 (two) times daily. 120 capsule 2   Multiple Vitamins-Minerals (MULTIVITAMIN ADULT, MINERALS,) TABS Take 1 tablet by mouth daily.     omeprazole (PRILOSEC) 20 MG capsule Take 20 mg by mouth daily.     Tiotropium Bromide-Olodaterol (STIOLTO RESPIMAT) 2.5-2.5 MCG/ACT AERS Inhale 2 Inhalers into the lungs daily at 12 noon.     amiodarone (PACERONE) 200 MG tablet Take 1 tablet (200 mg total) by mouth 2 (two) times daily. 60 tablet 0   apixaban (ELIQUIS) 5 MG TABS tablet Take 1 tablet (5 mg total) by mouth 2  (two) times daily. 60 tablet 0   diltiazem (CARDIZEM CD) 180 MG 24 hr capsule Take 1 capsule (180 mg total) by mouth 2 (two) times daily. 30 capsule 0   enalapril (VASOTEC) 10 MG tablet Take 1 tablet (10 mg total) by mouth daily. 30 tablet 0   metoprolol succinate (TOPROL-XL) 25 MG 24 hr tablet Take 1 tablet (25 mg total) by mouth daily. 30 tablet 0   No current facility-administered medications for this visit.    Allergies:   Shellfish allergy and Penicillin g    Social History:   reports that she quit smoking about 18 years ago. Her smoking use included cigarettes. She has a 35.00 pack-year smoking history. She has never used smokeless tobacco. She reports current alcohol use. She reports that she does not use drugs.   Family History:  family history includes Bone cancer in her brother and mother; COPD in her brother and father.    ROS:     Review of Systems  Constitutional: Negative.   HENT: Negative.    Eyes: Negative.   Respiratory: Negative.    Cardiovascular: Negative.   Gastrointestinal: Negative.   Genitourinary: Negative.   Musculoskeletal: Negative.   Skin: Negative.   Neurological: Negative.   Endo/Heme/Allergies:  Negative.   Psychiatric/Behavioral: Negative.    All other systems reviewed and are negative.    All other systems are reviewed and negative.    PHYSICAL EXAM: VS:  BP (!) 140/88   Pulse 93   Ht 5' 8.5" (1.74 m)   Wt 202 lb 6.4 oz (91.8 kg)   SpO2 98%   BMI 30.33 kg/m  , BMI Body mass index is 30.33 kg/m. Last weight:  Wt Readings from Last 3 Encounters:  09/06/22 202 lb 6.4 oz (91.8 kg)  08/20/22 199 lb 14.4 oz (90.7 kg)  07/01/22 194 lb (88 kg)    Physical Exam Constitutional:      Appearance: Normal appearance.  Cardiovascular:     Rate and Rhythm: Normal rate and regular rhythm.     Heart sounds: Normal heart sounds.  Pulmonary:     Effort: Pulmonary effort is normal.     Breath sounds: Normal breath sounds.  Musculoskeletal:      Right lower leg: No edema.     Left lower leg: No edema.  Neurological:     Mental Status: She is alert.     EKG: none today  Recent Labs: 02/26/2022: Magnesium 1.5 07/01/2022: BUN 8; Creatinine, Ser 0.62; Hemoglobin 11.3; Platelets 151; Potassium 3.2; Sodium 138    Lipid Panel    Component Value Date/Time   CHOL 355 (H) 02/22/2021 0403   TRIG 51 02/22/2021 0403   HDL 179 02/22/2021 0403   CHOLHDL 2.0 02/22/2021 0403   VLDL 10 02/22/2021 0403   LDLCALC 166 (H) 02/22/2021 0403     ASSESSMENT AND PLAN:    ICD-10-CM   1. Paroxysmal atrial fibrillation (HCC)  I48.0 amiodarone (PACERONE) 200 MG tablet    apixaban (ELIQUIS) 5 MG TABS tablet    diltiazem (CARDIZEM CD) 180 MG 24 hr capsule    metoprolol succinate (TOPROL-XL) 25 MG 24 hr tablet    DISCONTINUED: amiodarone (PACERONE) 200 MG tablet    DISCONTINUED: apixaban (ELIQUIS) 5 MG TABS tablet    DISCONTINUED: diltiazem (CARDIZEM CD) 180 MG 24 hr capsule    DISCONTINUED: metoprolol succinate (TOPROL-XL) 25 MG 24 hr tablet    2. Essential hypertension  I10 enalapril (VASOTEC) 10 MG tablet    DISCONTINUED: enalapril (VASOTEC) 10 MG tablet    3. SOB (shortness of breath)  R06.02        Problem List Items Addressed This Visit       Cardiovascular and Mediastinum   Atrial fibrillation (HCC) - Primary    Patient has been out of her amiodarone, diltiazem and metoprolol for 2-3 weeks. HR elevated today. Refills sent to local and mail away pharmacy. Has continued Eliquis.       Relevant Medications   amiodarone (PACERONE) 200 MG tablet   apixaban (ELIQUIS) 5 MG TABS tablet   diltiazem (CARDIZEM CD) 180 MG 24 hr capsule   enalapril (VASOTEC) 10 MG tablet   metoprolol succinate (TOPROL-XL) 25 MG 24 hr tablet   Essential hypertension    Elevated today. Has been out of Enalapril for 2-3 weeks. Refill sent to local and mail away pharmacy.       Relevant Medications   amiodarone (PACERONE) 200 MG tablet   apixaban (ELIQUIS)  5 MG TABS tablet   diltiazem (CARDIZEM CD) 180 MG 24 hr capsule   enalapril (VASOTEC) 10 MG tablet   metoprolol succinate (TOPROL-XL) 25 MG 24 hr tablet     Other   SOB (shortness of breath)    Patient  reports shortness of breath is no better or worse. Uses O2 as needed at home. Complaint with inhalers and nebulizer as needed. Echo showed diastolic dysfunction with LVEF 67%, trace AR, mild MR, unable to add aldactone, as may have HEpEF, but creat 1.51.        Disposition:   Return in about 4 weeks (around 10/04/2022).    Total time spent: 30 minutes  Signed,  Adrian Blackwater, MD  09/06/2022 11:00 AM    [facility]

## 2022-09-06 NOTE — Assessment & Plan Note (Signed)
Elevated today. Has been out of Enalapril for 2-3 weeks. Refill sent to local and mail away pharmacy.

## 2022-09-06 NOTE — Assessment & Plan Note (Addendum)
Patient reports shortness of breath is no better or worse. Uses O2 as needed at home. Complaint with inhalers and nebulizer as needed. Echo showed diastolic dysfunction with LVEF 67%, trace AR, mild MR, unable to add aldactone, as may have HEpEF, but creat 1.51.

## 2022-09-06 NOTE — Assessment & Plan Note (Signed)
Patient has been out of her amiodarone, diltiazem and metoprolol for 2-3 weeks. HR elevated today. Refills sent to local and mail away pharmacy. Has continued Eliquis.

## 2022-09-24 ENCOUNTER — Encounter: Payer: Self-pay | Admitting: Pulmonary Disease

## 2022-10-05 ENCOUNTER — Observation Stay
Admission: EM | Admit: 2022-10-05 | Discharge: 2022-10-07 | Disposition: A | Discharge status: Home / Self Care | Payer: 59 | Attending: Obstetrics and Gynecology | Admitting: Obstetrics and Gynecology

## 2022-10-05 ENCOUNTER — Other Ambulatory Visit: Payer: Self-pay

## 2022-10-05 ENCOUNTER — Observation Stay: Payer: 59

## 2022-10-05 ENCOUNTER — Emergency Department: Payer: 59

## 2022-10-05 DIAGNOSIS — G4733 Obstructive sleep apnea (adult) (pediatric): Secondary | ICD-10-CM | POA: Diagnosis present

## 2022-10-05 DIAGNOSIS — I480 Paroxysmal atrial fibrillation: Secondary | ICD-10-CM | POA: Diagnosis not present

## 2022-10-05 DIAGNOSIS — I10 Essential (primary) hypertension: Secondary | ICD-10-CM | POA: Diagnosis not present

## 2022-10-05 DIAGNOSIS — R7401 Elevation of levels of liver transaminase levels: Secondary | ICD-10-CM | POA: Diagnosis not present

## 2022-10-05 DIAGNOSIS — E871 Hypo-osmolality and hyponatremia: Secondary | ICD-10-CM | POA: Insufficient documentation

## 2022-10-05 DIAGNOSIS — Z7901 Long term (current) use of anticoagulants: Secondary | ICD-10-CM | POA: Diagnosis not present

## 2022-10-05 DIAGNOSIS — J449 Chronic obstructive pulmonary disease, unspecified: Secondary | ICD-10-CM | POA: Diagnosis not present

## 2022-10-05 DIAGNOSIS — Z85118 Personal history of other malignant neoplasm of bronchus and lung: Secondary | ICD-10-CM | POA: Insufficient documentation

## 2022-10-05 DIAGNOSIS — K219 Gastro-esophageal reflux disease without esophagitis: Secondary | ICD-10-CM | POA: Diagnosis present

## 2022-10-05 DIAGNOSIS — E876 Hypokalemia: Secondary | ICD-10-CM | POA: Diagnosis not present

## 2022-10-05 DIAGNOSIS — Z79899 Other long term (current) drug therapy: Secondary | ICD-10-CM | POA: Diagnosis not present

## 2022-10-05 DIAGNOSIS — F1090 Alcohol use, unspecified, uncomplicated: Secondary | ICD-10-CM | POA: Diagnosis present

## 2022-10-05 DIAGNOSIS — C3491 Malignant neoplasm of unspecified part of right bronchus or lung: Secondary | ICD-10-CM | POA: Diagnosis present

## 2022-10-05 DIAGNOSIS — Z87891 Personal history of nicotine dependence: Secondary | ICD-10-CM | POA: Diagnosis not present

## 2022-10-05 DIAGNOSIS — R42 Dizziness and giddiness: Secondary | ICD-10-CM | POA: Diagnosis present

## 2022-10-05 DIAGNOSIS — N179 Acute kidney failure, unspecified: Secondary | ICD-10-CM | POA: Diagnosis not present

## 2022-10-05 DIAGNOSIS — I4891 Unspecified atrial fibrillation: Secondary | ICD-10-CM | POA: Diagnosis present

## 2022-10-05 DIAGNOSIS — K529 Noninfective gastroenteritis and colitis, unspecified: Secondary | ICD-10-CM | POA: Diagnosis present

## 2022-10-05 LAB — URINALYSIS, ROUTINE W REFLEX MICROSCOPIC
Bilirubin Urine: NEGATIVE
Glucose, UA: NEGATIVE mg/dL
Hgb urine dipstick: NEGATIVE
Ketones, ur: NEGATIVE mg/dL
Leukocytes,Ua: NEGATIVE
Nitrite: NEGATIVE
Protein, ur: NEGATIVE mg/dL
Specific Gravity, Urine: 1.01 (ref 1.005–1.030)
pH: 5 (ref 5.0–8.0)

## 2022-10-05 LAB — BASIC METABOLIC PANEL
Anion gap: 13 (ref 5–15)
BUN: 55 mg/dL — ABNORMAL HIGH (ref 8–23)
CO2: 22 mmol/L (ref 22–32)
Calcium: 8.8 mg/dL — ABNORMAL LOW (ref 8.9–10.3)
Chloride: 91 mmol/L — ABNORMAL LOW (ref 98–111)
Creatinine, Ser: 3.96 mg/dL — ABNORMAL HIGH (ref 0.44–1.00)
GFR, Estimated: 11 mL/min — ABNORMAL LOW (ref >60)
Glucose, Bld: 92 mg/dL (ref 70–99)
Potassium: 2.8 mmol/L — ABNORMAL LOW (ref 3.5–5.1)
Sodium: 126 mmol/L — ABNORMAL LOW (ref 135–145)

## 2022-10-05 LAB — HEPATIC FUNCTION PANEL
ALT: 136 U/L — ABNORMAL HIGH (ref 0–44)
AST: 213 U/L — ABNORMAL HIGH (ref 15–41)
Albumin: 4 g/dL (ref 3.5–5.0)
Alkaline Phosphatase: 66 U/L (ref 38–126)
Bilirubin, Direct: 0.1 mg/dL (ref 0.0–0.2)
Indirect Bilirubin: 0.5 mg/dL (ref 0.3–0.9)
Total Bilirubin: 0.6 mg/dL (ref 0.3–1.2)
Total Protein: 7 g/dL (ref 6.5–8.1)

## 2022-10-05 LAB — CBC
HCT: 32.1 % — ABNORMAL LOW (ref 36.0–46.0)
Hemoglobin: 9.9 g/dL — ABNORMAL LOW (ref 12.0–15.0)
MCH: 30 pg (ref 26.0–34.0)
MCHC: 30.8 g/dL (ref 30.0–36.0)
MCV: 97.3 fL (ref 80.0–100.0)
Platelets: 128 10*3/uL — ABNORMAL LOW (ref 150–400)
RBC: 3.3 MIL/uL — ABNORMAL LOW (ref 3.87–5.11)
RDW: 15.9 % — ABNORMAL HIGH (ref 11.5–15.5)
WBC: 4.5 10*3/uL (ref 4.0–10.5)
nRBC: 0 % (ref 0.0–0.2)

## 2022-10-05 LAB — TYPE AND SCREEN

## 2022-10-05 LAB — MAGNESIUM: Magnesium: 1 mg/dL — ABNORMAL LOW (ref 1.7–2.4)

## 2022-10-05 LAB — ETHANOL: Alcohol, Ethyl (B): <10 mg/dL (ref <10)

## 2022-10-05 LAB — GLUCOSE, CAPILLARY: Glucose-Capillary: 106 mg/dL — ABNORMAL HIGH (ref 70–99)

## 2022-10-05 MED ORDER — ACETAMINOPHEN 650 MG RE SUPP: 650.0000 mg | Freq: Four times a day (QID) | RECTAL | Status: DC | PRN

## 2022-10-05 MED ORDER — APIXABAN 5 MG PO TABS
5.0000 mg | ORAL_TABLET | Freq: Two times a day (BID) | ORAL | Status: DC
  Administered 2022-10-05 – 2022-10-07 (×4): 5 mg via ORAL
  Filled 2022-10-05 (×4): qty 1

## 2022-10-05 MED ORDER — HYDRALAZINE HCL 20 MG/ML IJ SOLN: 5.0000 mg | INTRAMUSCULAR | Status: DC | PRN

## 2022-10-05 MED ORDER — ACETAMINOPHEN 325 MG PO TABS: 650.0000 mg | ORAL_TABLET | Freq: Four times a day (QID) | ORAL | Status: DC | PRN

## 2022-10-05 MED ORDER — PANTOPRAZOLE SODIUM 40 MG IV SOLR
40.0000 mg | Freq: Two times a day (BID) | INTRAVENOUS | Status: DC
  Administered 2022-10-05 – 2022-10-06 (×2): 40 mg via INTRAVENOUS
  Filled 2022-10-05 (×2): qty 10

## 2022-10-05 MED ORDER — HYDROCODONE-ACETAMINOPHEN 5-325 MG PO TABS: 1.0000 | ORAL_TABLET | ORAL | Status: DC | PRN

## 2022-10-05 MED ORDER — MORPHINE SULFATE (PF) 2 MG/ML IV SOLN: 2.0000 mg | INTRAVENOUS | Status: DC | PRN

## 2022-10-05 MED ORDER — SODIUM CHLORIDE 0.9% FLUSH
3.0000 mL | Freq: Two times a day (BID) | INTRAVENOUS | Status: DC
  Administered 2022-10-05 – 2022-10-06 (×3): 3 mL via INTRAVENOUS

## 2022-10-05 MED ORDER — INSULIN ASPART 100 UNIT/ML IJ SOLN: 0.0000 [IU] | Freq: Three times a day (TID) | INTRAMUSCULAR | Status: DC

## 2022-10-05 MED ORDER — THIAMINE HCL 100 MG/ML IJ SOLN
100.0000 mg | INTRAMUSCULAR | Status: DC
  Administered 2022-10-05 – 2022-10-06 (×2): 100 mg via INTRAVENOUS
  Filled 2022-10-05 (×2): qty 2

## 2022-10-05 MED ORDER — SODIUM CHLORIDE 0.9 % IV BOLUS
500.0000 mL | Freq: Once | INTRAVENOUS | Status: AC
  Administered 2022-10-05: 500 mL via INTRAVENOUS

## 2022-10-05 MED ORDER — SODIUM CHLORIDE 0.9 % IV BOLUS
250.0000 mL | Freq: Once | INTRAVENOUS | Status: AC
  Administered 2022-10-05: 250 mL via INTRAVENOUS

## 2022-10-05 MED ORDER — SODIUM CHLORIDE 0.9 % IV SOLN: INTRAVENOUS | Status: DC

## 2022-10-05 MED ORDER — AMIODARONE HCL 200 MG PO TABS
200.0000 mg | ORAL_TABLET | Freq: Two times a day (BID) | ORAL | Status: DC
  Administered 2022-10-05 – 2022-10-07 (×4): 200 mg via ORAL
  Filled 2022-10-05 (×4): qty 1

## 2022-10-05 NOTE — H&P (Signed)
History and Physical    Patient: Anne Wells GNF:[id number] DOB: January 17, 1951 DOA: 10/05/2022 DOS: the patient was seen and examined on 10/06/2022 PCP: Emogene Morgan, MD  Patient coming from: Home  Chief Complaint:  Chief Complaint  Patient presents with   Dizziness    HPI: Anne Wells is a 72 y.o. female with medical history significant for hypertension, A-fib with RVR, COPD, alcohol use disorder coming to [place] with low blood pressure or low heart rate and dizziness. Patient states that she send having some nausea vomiting and diarrhea for the past week no one else is sick in the house.  She denies any use of NSAIDs or Goody powders.  Family is at bedside and husband is worried that she received too many blood pressure medications from primary care physician.  Explained to family that low blood pressure can occur due to dehydration as well which is what I think is going on with his wife.  As is her kidney function is abnormal.  She does not report any chest pain palpitation fevers chills or any other complaints.   Review of Systems: Review of Systems  Constitutional:  Positive for malaise/fatigue.  Gastrointestinal:  Positive for nausea and vomiting.  Neurological:  Positive for dizziness.     Past Medical History:  Diagnosis Date   Atrial fibrillation with RVR (HCC) 02/21/2021   COPD (chronic obstructive pulmonary disease) (HCC)    Past Surgical History:  Procedure Laterality Date   Dental procedure     Social History:  reports that she quit smoking about 18 years ago. Her smoking use included cigarettes. She has a 35.00 pack-year smoking history. She has never used smokeless tobacco. She reports current alcohol use. She reports that she does not use drugs.  Allergies  Allergen Reactions   Shellfish Allergy Swelling    Swelling of the tongue   Penicillin G Rash    Family History  Problem Relation Age of Onset   Bone cancer Mother    COPD Father    Bone cancer  Brother    COPD Brother     Prior to Admission medications   Medication Sig Start Date End Date Taking? Authorizing Provider  ACETAMINOPHEN 8 HOUR 650 MG CR tablet SMARTSIG:1-2 Tablet(s) By Mouth Every 8 Hours PRN 03/27/21   [provider]  acyclovir (ZOVIRAX) 400 MG tablet Take 400 mg by mouth 2 (two) times daily. 03/27/21   [provider]  albuterol (PROVENTIL) (2.5 MG/3ML) 0.083% nebulizer solution Inhale 3 mLs into the lungs every 6 (six) hours as needed. 09/23/20 09/06/22  [provider]  albuterol (VENTOLIN HFA) 108 (90 Base) MCG/ACT inhaler INHALE TWO (2) PUFFS BY MOUTH EVERY 6 HOURS AS NEEDED FOR WHEEZING OR FOR SHORTNESS OF BREATH 05/31/22   Salena Saner, MD  amiodarone (PACERONE) 200 MG tablet Take 1 tablet (200 mg total) by mouth 2 (two) times daily. 09/06/22   Laurier Nancy, MD  apixaban (ELIQUIS) 5 MG TABS tablet Take 1 tablet (5 mg total) by mouth 2 (two) times daily. 09/06/22   Laurier Nancy, MD  cyclobenzaprine (FLEXERIL) 5 MG tablet Take 5 mg by mouth 2 (two) times daily. 01/05/21   [provider]  diltiazem (CARDIZEM CD) 180 MG 24 hr capsule Take 1 capsule (180 mg total) by mouth 2 (two) times daily. 09/06/22   Laurier Nancy, MD  enalapril (VASOTEC) 10 MG tablet Take 1 tablet (10 mg total) by mouth daily. 09/06/22   Adrian Blackwater  A, MD  folic acid (FOLVITE) 1 MG tablet Take 1 mg by mouth daily. 09/07/21   [provider]  gabapentin (NEURONTIN) 300 MG capsule Take 2 capsules (600 mg total) by mouth 2 (two) times daily. 02/22/21 09/06/22  Darlin Priestly, MD  metoprolol succinate (TOPROL-XL) 25 MG 24 hr tablet Take 1 tablet (25 mg total) by mouth daily. 09/06/22   Laurier Nancy, MD  Multiple Vitamins-Minerals (MULTIVITAMIN ADULT, MINERALS,) TABS Take 1 tablet by mouth daily.    [provider]  omeprazole (PRILOSEC) 20 MG capsule Take 20 mg by mouth daily. 11/15/20   [provider]  Tiotropium Bromide-Olodaterol  (STIOLTO RESPIMAT) 2.5-2.5 MCG/ACT AERS Inhale 2 Inhalers into the lungs daily at 12 noon. 09/23/20   [provider]     Vitals:   10/05/22 1800 10/05/22 2000 10/05/22 2205 10/05/22 2209  BP: 110/71 121/75  114/68  Pulse: 78 81  92  Resp: 16   20  Temp: 98.4 F (36.9 C)   (!) 97.5 F (36.4 C)  TempSrc: Oral   Oral  SpO2: 99% 98%  96%  Weight:   89.1 kg   Height:   5' 8.5" (1.74 m)    Physical Exam Vitals and nursing note reviewed.  Constitutional:      General: She is not in acute distress. HENT:     Head: Normocephalic and atraumatic.     Right Ear: Hearing normal.     Left Ear: Hearing normal.     Nose: Nose normal. No nasal deformity.     Mouth/Throat:     Lips: Pink.     Tongue: No lesions.     Pharynx: Oropharynx is clear.  Eyes:     General: Lids are normal.     Extraocular Movements: Extraocular movements intact.  Cardiovascular:     Rate and Rhythm: Normal rate and regular rhythm.     Heart sounds: Normal heart sounds.  Pulmonary:     Effort: Pulmonary effort is normal.     Breath sounds: Normal breath sounds.  Abdominal:     General: Bowel sounds are normal. There is no distension.     Palpations: Abdomen is soft. There is no mass.     Tenderness: There is no abdominal tenderness.  Musculoskeletal:     Right lower leg: No edema.     Left lower leg: No edema.  Skin:    General: Skin is warm.  Neurological:     General: No focal deficit present.     Mental Status: She is alert and oriented to person, place, and time.     Cranial Nerves: Cranial nerves 2-12 are intact.  Psychiatric:        Attention and Perception: Attention normal.        Mood and Affect: Mood normal.        Speech: Speech normal.        Behavior: Behavior normal. Behavior is cooperative.      Labs on Admission: I have personally reviewed following labs and imaging studies  CBC: Recent Labs  Lab 10/05/22 1341  WBC 4.5  HGB 9.9*  HCT 32.1*  MCV 97.3  PLT 128*    Basic Metabolic Panel: Recent Labs  Lab 10/05/22 1341 10/05/22 1931  NA 126*  --   K 2.8*  --   CL 91*  --   CO2 22  --   GLUCOSE 92  --   BUN 55*  --   CREATININE 3.96*  --  CALCIUM 8.8*  --   MG  --  1.0*   GFR: Estimated Creatinine Clearance: 15.1 mL/min (A) (by C-G formula based on SCr of 3.96 mg/dL (H)). Liver Function Tests: Recent Labs  Lab 10/05/22 1902  AST 213*  ALT 136*  ALKPHOS 66  BILITOT 0.6  PROT 7.0  ALBUMIN 4.0   No results for input(s): "LIPASE", "AMYLASE" in the last 168 hours. No results for input(s): "AMMONIA" in the last 168 hours. Coagulation Profile: No results for input(s): "INR", "PROTIME" in the last 168 hours. Cardiac Enzymes: No results for input(s): "CKTOTAL", "CKMB", "CKMBINDEX", "TROPONINI" in the last 168 hours. BNP (last 3 results) No results for input(s): "PROBNP" in the last 8760 hours. HbA1C: No results for input(s): "HGBA1C" in the last 72 hours. CBG: Recent Labs  Lab 10/05/22 2224  GLUCAP 106*   Lipid Profile: No results for input(s): "CHOL", "HDL", "LDLCALC", "TRIG", "CHOLHDL", "LDLDIRECT" in the last 72 hours. Thyroid Function Tests: No results for input(s): "TSH", "T4TOTAL", "FREET4", "T3FREE", "THYROIDAB" in the last 72 hours. Anemia Panel: No results for input(s): "VITAMINB12", "FOLATE", "FERRITIN", "TIBC", "IRON", "RETICCTPCT" in the last 72 hours. Urine analysis:    Component Value Date/Time   COLORURINE YELLOW (A) 10/05/2022 1536   APPEARANCEUR HAZY (A) 10/05/2022 1536   LABSPEC 1.010 10/05/2022 1536   PHURINE 5.0 10/05/2022 1536   GLUCOSEU NEGATIVE 10/05/2022 1536   HGBUR NEGATIVE 10/05/2022 1536   BILIRUBINUR NEGATIVE 10/05/2022 1536   KETONESUR NEGATIVE 10/05/2022 1536   PROTEINUR NEGATIVE 10/05/2022 1536   NITRITE NEGATIVE 10/05/2022 1536   LEUKOCYTESUR NEGATIVE 10/05/2022 1536    Radiological Exams on Admission: US ABDOMEN LIMITED RUQ (LIVER/GB)  Result Date: 10/05/2022 CLINICAL DATA:   Pancreatitis. EXAM: ULTRASOUND ABDOMEN LIMITED RIGHT UPPER QUADRANT COMPARISON:  None Available. FINDINGS: Gallbladder: A 5.8 mm nonshadowing gallbladder polyp is seen along the nondependent wall of the gallbladder lumen. No gallstones or wall thickening visualized (2.1 mm). No sonographic Murphy sign noted by sonographer. Common bile duct: Diameter: 4.4 mm Liver: No focal lesion identified. Diffusely increased echogenicity of the liver parenchyma is noted. Portal vein is patent on color Doppler imaging with normal direction of blood flow towards the liver. Other: None. IMPRESSION: 1. 5.8 mm, likely benign gallbladder polyp. No follow-up imaging is recommended. This recommendation follows ACR consensus guidelines: White Paper of the ACR Incidental Findings Committee II on Gallbladder and Biliary Findings. J Am Coll Radiol 2013:;10:953-956. 2. Hepatic steatosis without focal liver lesions. Electronically Signed   By: Aram Candela M.D.   On: 10/05/2022 20:45   CT Renal Stone Study  Result Date: 10/05/2022 CLINICAL DATA:  Bladder neck obstruction.  Acute kidney injury. EXAM: CT ABDOMEN AND PELVIS WITHOUT CONTRAST TECHNIQUE: Multidetector CT imaging of the abdomen and pelvis was performed following the standard protocol without IV contrast. RADIATION DOSE REDUCTION: This exam was performed according to the departmental dose-optimization program which includes automated exposure control, adjustment of the mA and/or kV according to patient size and/or use of iterative reconstruction technique. COMPARISON:  CT of the chest on 08/16/2022 FINDINGS: Lower chest: There are scattered emphysematous changes. Heart size is normal. Moderate hiatal hernia. Hepatobiliary: The liver is enlarged, 20.2 centimeters in craniocaudal length. There is focal fatty infiltration adjacent to the falciform ligament. The liver parenchyma does not meet density criteria for a hepatic steatosis. No discrete liver lesion. Gallbladder is  present. Pancreas: Unremarkable. No pancreatic ductal dilatation or surrounding inflammatory changes. Spleen: Normal in size without focal abnormality. Adrenals/Urinary Tract: Adrenal glands are normal.  The kidneys are symmetric, without mass or hydronephrosis. No intrarenal or ureteral stones are present. The bladder and visualized portion of the urethra are normal. No evidence for pelvic floor laxity. Stomach/Bowel: Moderate hiatal hernia. Otherwise the stomach is normal in appearance. Small bowel loops are normal in caliber and wall thickness. There are numerous colonic diverticula but no acute diverticulitis. Appendix is normal in caliber, without surrounding inflammatory changes. Vascular/Lymphatic: There is atherosclerotic calcification of the abdominal aorta, not associated with aneurysm. No retroperitoneal or mesenteric adenopathy. Reproductive: The uterus is present.  No adnexal mass. Other: Abdominal wall is unremarkable.  No ascites. Musculoskeletal: No acute or significant osseous findings. IMPRESSION: 1. No evidence for urinary tract stone or obstruction. 2. Normal appearance of the bladder and visualized portion of the urethra. 3. Moderate hiatal hernia. 4. Hepatomegaly without evidence for hepatic steatosis. 5. Colonic diverticulosis. 6. Aortic Atherosclerosis (ICD10-I70.0) and Emphysema (ICD10-J43.9). Electronically Signed   By: Norva Pavlov M.D.   On: 10/05/2022 17:00     Data Reviewed: Relevant notes from primary care and specialist visits, past discharge summaries as available in EHR, including Care Everywhere. Prior diagnostic testing as pertinent to current admission diagnoses Updated medications and problem lists for reconciliation ED course, including vitals, labs, imaging, treatment and response to treatment Triage notes, nursing and pharmacy notes and ED provider's notes Notable results as noted in HPI Assessment and Plan: * AKI (acute kidney injury) (HCC) Lab Results   Component Value Date   CREATININE 3.96 (H) 10/05/2022   CREATININE 0.62 07/01/2022   CREATININE 0.73 05/31/2022  Patient coming in with low blood pressure and acute kidney injury. Secondary to her nausea vomiting diarrhea that is been going on for the past 2 weeks. Will avoid contrast studies renally dose medications. Aggressive IV fluid hydration overnight.  Hold blood pressure and diuretic medication.  As needed hydralazine.   Hyponatremia    Latest Ref Rng & Units 10/05/2022    1:41 PM 07/01/2022    1:13 AM 05/31/2022   10:30 AM  BMP  Glucose 70 - 99 mg/dL 92  77  956   BUN 8 - 23 mg/dL 55  8  15   Creatinine 0.44 - 1.00 mg/dL 2.13  0.86  5.78   BUN/Creat Ratio 12 - 28   21   Sodium 135 - 145 mmol/L 126  138  142   Potassium 3.5 - 5.1 mmol/L 2.8  3.2  3.6   Chloride 98 - 111 mmol/L 91  100  100   CO2 22 - 32 mmol/L 22  26  26    Calcium 8.9 - 10.3 mg/dL 8.8  8.4  9.5   2/2 to GI losses. We will cont with MIVF  and hold bp meds.      Transaminitis 2/2 ETOH. 2/2 to low bp.  We will cont ivf hydration RUQ USG negative for any stone and + for gall bladder polyp    Hypokalemia Current potassium of 2.2 Replacement started.   Alcohol use disorder Will place patient on CIWA protocol every 4 hourly evaluations, alcohol level less than 10 patient does not drink heavily she states that she drinks hard liquor regularly but not heavy.  Thiamine 100 mg starting tonight daily.  Chronic anticoagulation Secondary to atrial fibrillation history patient is continued on Eliquis.  Essential hypertension Vitals:   10/05/22 1349 10/05/22 1530 10/05/22 1600 10/05/22 1705  BP: (!) 100/59 (!) 93/56 106/64 107/62   10/05/22 1800 10/05/22 2000 10/05/22 2209  BP: 110/71 121/75  114/68  Currently patient's metoprolol, enalapril, diltiazem Held.  Started MIVF.    GERD (gastroesophageal reflux disease) Patient's PPI therapy continued.  Atrial fibrillation (HCC) EKG today shows sinus  rhythm at 81 with QTc of 453, ST elevation in V6 only. Will continue patient on amiodarone and Eliquis.  Hypomagnesemia Secondary to alcohol abuse. Will replace and follow levels.  COPD (chronic obstructive pulmonary disease) (HCC) Stable continue with as needed albuterol as needed  DVT prophylaxis:  Eliquis   Consults:  None   Advance Care Planning:    Code Status: Full Code   Family Communication:  Family   Disposition Plan:  Back to previous home environment  Severity of Illness: The appropriate patient status for this patient is OBSERVATION. Observation status is judged to be reasonable and necessary in order to provide the required intensity of service to ensure the patient's safety. The patient's presenting symptoms, physical exam findings, and initial radiographic and laboratory data in the context of their medical condition is felt to place them at decreased risk for further clinical deterioration. Furthermore, it is anticipated that the patient will be medically stable for discharge from the hospital within 2 midnights of admission.   Author: Gertha Calkin, MD 10/06/2022 2:09 AM  For on call review [email or web address].

## 2022-10-05 NOTE — ED Triage Notes (Signed)
Pt c/o SHOB, dizziness, and low BP x3 days. Pt denies CP, has COPD. Pt is AOX4, NAD noted. Pt states she was sent by PCP for abnormal kidney function labs.

## 2022-10-05 NOTE — ED Notes (Signed)
Multiple PIV attempts by myself and additional RN Theron without success. MD Quale notified of difficulties obtaining IV access and consulting on the need for it at this time.

## 2022-10-05 NOTE — Hospital Course (Signed)
AKI and cause unclear.

## 2022-10-05 NOTE — ED Notes (Signed)
Patient is sitting in the lobby, appears comfortable, eating a snack. Family is with the patient.

## 2022-10-05 NOTE — ED Provider Notes (Signed)
[facility] Provider Note    Event Date/Time   First MD Initiated Contact with Patient 10/05/22 1600     (approximate)   History   Dizziness   HPI  Anne Wells is a 72 y.o. female history of paroxysmal A-fib, COPD   Patient is noticed that she has not urinated much in the last few days.  She saw her doctor and was getting evaluated for preoperative evaluation for cataract type surgery and had labs drawn yesterday  She received a phone call today directing her to come for evaluation  She still eating and drinking well.  She reports has been very thirsty but only urinated once yesterday.  She had a little bit of back pain in the last couple days but that has resolved.  No chest pain no nausea or vomiting no swelling.    Physical Exam   Triage Vital Signs: ED Triage Vitals  Enc Vitals Group     BP 10/05/22 1349 (!) 100/59     Pulse Rate 10/05/22 1349 85     Resp 10/05/22 1349 18     Temp 10/05/22 1349 98.9 F (37.2 C)     Temp Source 10/05/22 1349 Oral     SpO2 10/05/22 1349 95 %     Weight 10/05/22 1536 184 lb (83.5 kg)     Height 10/05/22 1536 5\' 8"  (1.727 m)     Head Circumference --      Peak Flow --      Pain Score 10/05/22 1343 0     Pain Loc --      Pain Edu? --      Excl. in GC? --     Most recent vital signs: Vitals:   10/05/22 1705 10/05/22 1800  BP: 107/62 110/71  Pulse: 76 78  Resp:  16  Temp:  98.4 F (36.9 C)  SpO2: 99% 99%     General: Awake, no distress.  CV:  Good peripheral perfusion.  Normal tones and rate Resp:  Normal effort.  Clear bilateral Abd:  No distention.  Soft nontender nondistended throughout Other:  This membranes are moist.  Suspect thrush on the tongue   ED Results / Procedures / Treatments   Labs (all labs ordered are listed, but only abnormal results are displayed) Labs Reviewed  BASIC METABOLIC PANEL - Abnormal; Notable for the following components:      Result Value   Sodium 126  (*)    Potassium 2.8 (*)    Chloride 91 (*)    BUN 55 (*)    Creatinine, Ser 3.96 (*)    Calcium 8.8 (*)    GFR, Estimated 11 (*)    All other components within normal limits  CBC - Abnormal; Notable for the following components:   RBC 3.30 (*)    Hemoglobin 9.9 (*)    HCT 32.1 (*)    RDW 15.9 (*)    Platelets 128 (*)    All other components within normal limits  URINALYSIS, ROUTINE W REFLEX MICROSCOPIC - Abnormal; Notable for the following components:   Color, Urine YELLOW (*)    APPearance HAZY (*)    Bacteria, UA RARE (*)    All other components within normal limits     EKG  Interpreted me at 1350 heart rate 80 QRS 80 QTc 450 Normal sinus rhythm slight artifact.  No evidence of acute ischemia.  Mild nonspecific T wave abnormality   RADIOLOGY  CT imaging interpreted by  me as negative for acute gross renal obstructive pathology  CT Renal Stone Study  Result Date: 10/05/2022 CLINICAL DATA:  Bladder neck obstruction.  Acute kidney injury. EXAM: CT ABDOMEN AND PELVIS WITHOUT CONTRAST TECHNIQUE: Multidetector CT imaging of the abdomen and pelvis was performed following the standard protocol without IV contrast. RADIATION DOSE REDUCTION: This exam was performed according to the departmental dose-optimization program which includes automated exposure control, adjustment of the mA and/or kV according to patient size and/or use of iterative reconstruction technique. COMPARISON:  CT of the chest on 08/16/2022 FINDINGS: Lower chest: There are scattered emphysematous changes. Heart size is normal. Moderate hiatal hernia. Hepatobiliary: The liver is enlarged, 20.2 centimeters in craniocaudal length. There is focal fatty infiltration adjacent to the falciform ligament. The liver parenchyma does not meet density criteria for a hepatic steatosis. No discrete liver lesion. Gallbladder is present. Pancreas: Unremarkable. No pancreatic ductal dilatation or surrounding inflammatory changes. Spleen:  Normal in size without focal abnormality. Adrenals/Urinary Tract: Adrenal glands are normal. The kidneys are symmetric, without mass or hydronephrosis. No intrarenal or ureteral stones are present. The bladder and visualized portion of the urethra are normal. No evidence for pelvic floor laxity. Stomach/Bowel: Moderate hiatal hernia. Otherwise the stomach is normal in appearance. Small bowel loops are normal in caliber and wall thickness. There are numerous colonic diverticula but no acute diverticulitis. Appendix is normal in caliber, without surrounding inflammatory changes. Vascular/Lymphatic: There is atherosclerotic calcification of the abdominal aorta, not associated with aneurysm. No retroperitoneal or mesenteric adenopathy. Reproductive: The uterus is present.  No adnexal mass. Other: Abdominal wall is unremarkable.  No ascites. Musculoskeletal: No acute or significant osseous findings. IMPRESSION: 1. No evidence for urinary tract stone or obstruction. 2. Normal appearance of the bladder and visualized portion of the urethra. 3. Moderate hiatal hernia. 4. Hepatomegaly without evidence for hepatic steatosis. 5. Colonic diverticulosis. 6. Aortic Atherosclerosis (ICD10-I70.0) and Emphysema (ICD10-J43.9). Electronically Signed   By: Norva Pavlov M.D.   On: 10/05/2022 17:00    Hepatomegaly noted   PROCEDURES:  Critical Care performed: No  Procedures   MEDICATIONS ORDERED IN ED: Medications  sodium chloride 0.9 % bolus 250 mL (0 mLs Intravenous Stopped 10/05/22 1823)     IMPRESSION / MDM / ASSESSMENT AND PLAN / ED COURSE  I reviewed the triage vital signs and the nursing notes.                              Differential diagnosis includes, but is not limited to, acute kidney injury of unknown cause at this time.  Workup here shows significantly elevated creatinine with mild hypokalemia as well as hyponatremia.  She has no symptoms of weakness she has normal mental status.  She does not  clearly appear hyper or hypovolemic at this point.  CT imaging of the abdomen pelvis without ureteral obstructive pathology.  Urinalysis without clear evidence of infection though only rare bacteria are noted.  At this juncture, has suspicion the patient may have prerenal or potential renal disease.  Will need further workup given the degree of AKI noted today.  Just 3 months ago her renal function was normal.  Patient's presentation is most consistent with acute complicated illness / injury requiring diagnostic workup.   The patient is on the cardiac monitor to evaluate for evidence of arrhythmia and/or significant heart rate changes.   Patient will require hospitalization for urgent workup as to cause for AKI.  Patient  and her husband both understand agreeable with this plan  Due to the degree of her hyponatremia I did give her a very small fluid bolus of 250 mL normal saline.  This will need to be followed carefully and taken to clinical consideration and further workup anticipated to hospitalist team.  I consulted with and discussed case with Dr. Renaldo Reel who accepts     FINAL CLINICAL IMPRESSION(S) / ED DIAGNOSES   Final diagnoses:  Hypokalemia  AKI (acute kidney injury) (HCC)  Hyponatremia     Rx / DC Orders   ED Discharge Orders     None        Note:  This document was prepared using Dragon voice recognition software and may include unintentional dictation errors.   Sharyn Creamer, MD 10/05/22 Mikle Bosworth

## 2022-10-06 ENCOUNTER — Encounter: Payer: Self-pay | Admitting: Internal Medicine

## 2022-10-06 DIAGNOSIS — N179 Acute kidney failure, unspecified: Secondary | ICD-10-CM

## 2022-10-06 LAB — CBC
HCT: 29.8 % — ABNORMAL LOW (ref 36.0–46.0)
Hemoglobin: 9.2 g/dL — ABNORMAL LOW (ref 12.0–15.0)
MCH: 29.4 pg (ref 26.0–34.0)
MCHC: 30.9 g/dL (ref 30.0–36.0)
MCV: 95.2 fL (ref 80.0–100.0)
Platelets: 102 10*3/uL — ABNORMAL LOW (ref 150–400)
RBC: 3.13 MIL/uL — ABNORMAL LOW (ref 3.87–5.11)
RDW: 15.9 % — ABNORMAL HIGH (ref 11.5–15.5)
WBC: 2.3 10*3/uL — ABNORMAL LOW (ref 4.0–10.5)
nRBC: 0 % (ref 0.0–0.2)

## 2022-10-06 LAB — COMPREHENSIVE METABOLIC PANEL
ALT: 119 U/L — ABNORMAL HIGH (ref 0–44)
AST: 154 U/L — ABNORMAL HIGH (ref 15–41)
Albumin: 3.2 g/dL — ABNORMAL LOW (ref 3.5–5.0)
Alkaline Phosphatase: 63 U/L (ref 38–126)
Anion gap: 9 (ref 5–15)
BUN: 50 mg/dL — ABNORMAL HIGH (ref 8–23)
CO2: 26 mmol/L (ref 22–32)
Calcium: 8.5 mg/dL — ABNORMAL LOW (ref 8.9–10.3)
Chloride: 101 mmol/L (ref 98–111)
Creatinine, Ser: 1.84 mg/dL — ABNORMAL HIGH (ref 0.44–1.00)
GFR, Estimated: 29 mL/min — ABNORMAL LOW (ref >60)
Glucose, Bld: 97 mg/dL (ref 70–99)
Potassium: 2.9 mmol/L — ABNORMAL LOW (ref 3.5–5.1)
Sodium: 136 mmol/L (ref 135–145)
Total Bilirubin: 0.5 mg/dL (ref 0.3–1.2)
Total Protein: 6 g/dL — ABNORMAL LOW (ref 6.5–8.1)

## 2022-10-06 LAB — MAGNESIUM: Magnesium: 2.1 mg/dL (ref 1.7–2.4)

## 2022-10-06 LAB — BASIC METABOLIC PANEL
Anion gap: 7 (ref 5–15)
BUN: 39 mg/dL — ABNORMAL HIGH (ref 8–23)
CO2: 27 mmol/L (ref 22–32)
Calcium: 9 mg/dL (ref 8.9–10.3)
Chloride: 104 mmol/L (ref 98–111)
Creatinine, Ser: 1.15 mg/dL — ABNORMAL HIGH (ref 0.44–1.00)
GFR, Estimated: 51 mL/min — ABNORMAL LOW (ref >60)
Glucose, Bld: 90 mg/dL (ref 70–99)
Potassium: 5.1 mmol/L (ref 3.5–5.1)
Sodium: 138 mmol/L (ref 135–145)

## 2022-10-06 LAB — HEPATITIS PANEL, ACUTE
HCV Ab: NONREACTIVE
Hep A IgM: NONREACTIVE
Hep B C IgM: NONREACTIVE
Hepatitis B Surface Ag: NONREACTIVE

## 2022-10-06 LAB — HEMOGLOBIN A1C
Hgb A1c MFr Bld: 5 % (ref 4.8–5.6)
Mean Plasma Glucose: 96.8 mg/dL

## 2022-10-06 LAB — TYPE AND SCREEN
ABO/RH(D): O POS
Antibody Screen: NEGATIVE

## 2022-10-06 LAB — GLUCOSE, CAPILLARY: Glucose-Capillary: 86 mg/dL (ref 70–99)

## 2022-10-06 MED ORDER — UMECLIDINIUM BROMIDE 62.5 MCG/ACT IN AEPB
1.0000 | INHALATION_SPRAY | Freq: Every day | RESPIRATORY_TRACT | Status: DC
  Filled 2022-10-06: qty 7

## 2022-10-06 MED ORDER — MAGNESIUM SULFATE 2 GM/50ML IV SOLN
2.0000 g | Freq: Once | INTRAVENOUS | Status: AC
  Administered 2022-10-06: 2 g via INTRAVENOUS
  Filled 2022-10-06: qty 50

## 2022-10-06 MED ORDER — GABAPENTIN 300 MG PO CAPS
600.0000 mg | ORAL_CAPSULE | Freq: Two times a day (BID) | ORAL | Status: DC
  Administered 2022-10-06 – 2022-10-07 (×2): 600 mg via ORAL
  Filled 2022-10-06 (×2): qty 2

## 2022-10-06 MED ORDER — POTASSIUM CHLORIDE 10 MEQ/100ML IV SOLN
10.0000 meq | INTRAVENOUS | Status: DC
  Administered 2022-10-06 (×2): 10 meq via INTRAVENOUS
  Filled 2022-10-06 (×3): qty 100

## 2022-10-06 MED ORDER — ARFORMOTEROL TARTRATE 15 MCG/2ML IN NEBU
15.0000 ug | INHALATION_SOLUTION | Freq: Two times a day (BID) | RESPIRATORY_TRACT | Status: DC
  Administered 2022-10-07: 15 ug via RESPIRATORY_TRACT
  Filled 2022-10-06 (×2): qty 2

## 2022-10-06 MED ORDER — POTASSIUM CHLORIDE 20 MEQ PO PACK
60.0000 meq | PACK | Freq: Two times a day (BID) | ORAL | Status: DC
  Administered 2022-10-06 – 2022-10-07 (×3): 60 meq via ORAL
  Filled 2022-10-06 (×3): qty 3

## 2022-10-06 MED ORDER — SODIUM CHLORIDE 0.9 % IV SOLN: INTRAVENOUS | Status: AC

## 2022-10-06 MED ORDER — POTASSIUM CHLORIDE CRYS ER 20 MEQ PO TBCR
60.0000 meq | EXTENDED_RELEASE_TABLET | Freq: Once | ORAL | Status: DC
  Filled 2022-10-06: qty 3

## 2022-10-06 MED ORDER — SODIUM CHLORIDE 0.9 % IV SOLN: INTRAVENOUS | Status: DC

## 2022-10-06 MED ORDER — POTASSIUM CHLORIDE CRYS ER 20 MEQ PO TBCR
40.0000 meq | EXTENDED_RELEASE_TABLET | Freq: Once | ORAL | Status: AC
  Administered 2022-10-06: 40 meq via ORAL
  Filled 2022-10-06: qty 2

## 2022-10-06 NOTE — Assessment & Plan Note (Signed)
Stable continue with as needed albuterol as needed

## 2022-10-06 NOTE — Assessment & Plan Note (Addendum)
2/2 ETOH. 2/2 to low bp.  We will cont ivf hydration RUQ USG negative for any stone and + for gall bladder polyp

## 2022-10-06 NOTE — Assessment & Plan Note (Signed)
Vitals:   10/05/22 1349 10/05/22 1530 10/05/22 1600 10/05/22 1705  BP: (!) 100/59 (!) 93/56 106/64 107/62   10/05/22 1800 10/05/22 2000 10/05/22 2209  BP: 110/71 121/75 114/68  Currently patient's metoprolol, enalapril, diltiazem Held.  Started MIVF.

## 2022-10-06 NOTE — Assessment & Plan Note (Addendum)
    Latest Ref Rng & Units 10/05/2022    1:41 PM 07/01/2022    1:13 AM 05/31/2022   10:30 AM  BMP  Glucose 70 - 99 mg/dL 92  77  409   BUN 8 - 23 mg/dL 55  8  15   Creatinine 0.44 - 1.00 mg/dL 8.11  9.14  7.82   BUN/Creat Ratio 12 - 28   21   Sodium 135 - 145 mmol/L 126  138  142   Potassium 3.5 - 5.1 mmol/L 2.8  3.2  3.6   Chloride 98 - 111 mmol/L 91  100  100   CO2 22 - 32 mmol/L 22  26  26    Calcium 8.9 - 10.3 mg/dL 8.8  8.4  9.5   2/2 to GI losses. We will cont with MIVF  and hold bp meds.

## 2022-10-06 NOTE — Assessment & Plan Note (Signed)
Secondary to atrial fibrillation history patient is continued on Eliquis.

## 2022-10-06 NOTE — Assessment & Plan Note (Signed)
Will place patient on CIWA protocol every 4 hourly evaluations, alcohol level less than 10 patient does not drink heavily she states that she drinks hard liquor regularly but not heavy.  Thiamine 100 mg starting tonight daily.

## 2022-10-06 NOTE — Assessment & Plan Note (Signed)
Lab Results  Component Value Date   CREATININE 3.96 (H) 10/05/2022   CREATININE 0.62 07/01/2022   CREATININE 0.73 05/31/2022  Patient coming in with low blood pressure and acute kidney injury. Secondary to her nausea vomiting diarrhea that is been going on for the past 2 weeks. Will avoid contrast studies renally dose medications. Aggressive IV fluid hydration overnight.  Hold blood pressure and diuretic medication.  As needed hydralazine.

## 2022-10-06 NOTE — Assessment & Plan Note (Signed)
Secondary to alcohol abuse. Will replace and follow levels.

## 2022-10-06 NOTE — Progress Notes (Addendum)
PROGRESS NOTE    Anne Wells  ZOX:[id number] DOB: 24-Feb-1951 DOA: 10/05/2022 PCP: Emogene Morgan, MD  Outpatient Specialists: oncology    Brief Narrative:   From admission h and p  Anne Wells is a 72 y.o. female with medical history significant for hypertension, A-fib with RVR, COPD, alcohol use disorder coming to [place] with low blood pressure or low heart rate and dizziness. Patient states that she send having some nausea vomiting and diarrhea for the past week no one else is sick in the house.  She denies any use of NSAIDs or Goody powders.  Family is at bedside and husband is worried that she received too many blood pressure medications from primary care physician.  Explained to family that low blood pressure can occur due to dehydration as well which is what I think is going on with his wife.  As is her kidney function is abnormal.  She does not report any chest pain palpitation fevers chills or any other complaints.    Assessment & Plan:   Principal Problem:   AKI (acute kidney injury) (HCC) Active Problems:   Hypokalemia   Transaminitis   Hyponatremia   COPD (chronic obstructive pulmonary disease) (HCC)   Hypomagnesemia   Atrial fibrillation (HCC)   GERD (gastroesophageal reflux disease)   Essential hypertension   Malignant neoplasm of right lung (HCC)   OSA (obstructive sleep apnea)   Chronic anticoagulation   Alcohol use disorder  # AKI Sent to ER by PCPs office for this. Cr 3.96 on presentation, baseline is normal, improved to 1.84 today with fluids. Normal bicarb. Recent nausea/vomiting/diarrhea though that resolved as of a week ago. Cardiology recently re-started multiple meds, suspect that played a big role - pharmacy med rec to occur - continue fluids  # Hypokalemia # Hypomagnesemia K 2.9 this morning, Mg pending - continue repletion  # Paroxysmal a-fib Rate controlled here - med rec pending - cont home apixaban, amiodarone - looks like on dilt and  metop at home, would probably only re-start one of those  # HTN Here bps low, asymptomatic - holding home enalapril, metop, dilt  # Lung cancer S/p radiation therapy, breathing stable - f/u oncology  # COPD Quiescent - brovana/incruse for home stiolto/bevespi  # OSA Not on cpap   # Chronic pain - home gabapentin   DVT prophylaxis: apixaban Code Status: full Family Communication: none at bedside  Level of care: Telemetry Medical    Consultants:  none  Procedures: none  Antimicrobials:  none    Subjective: Feeling well, no pain, no n/v/d, no lightheadedness with ambulation  Objective: Vitals:   10/05/22 2209 10/06/22 0412 10/06/22 0454 10/06/22 0802  BP: 114/68  (!) 96/56 108/61  Pulse: 92  73 71  Resp: 20  17 17   Temp: (!) 97.5 F (36.4 C)  98.4 F (36.9 C) 98 F (36.7 C)  TempSrc: Oral     SpO2: 96%  97% 100%  Weight:  89.7 kg    Height:        Intake/Output Summary (Last 24 hours) at 10/06/2022 0911 Last data filed at 10/05/2022 2323 Gross per 24 hour  Intake 572.08 ml  Output 300 ml  Net 272.08 ml   Filed Weights   10/05/22 1536 10/05/22 2205 10/06/22 0412  Weight: 83.5 kg 89.1 kg 89.7 kg    Examination:  General exam: Appears calm and comfortable  Respiratory system: Clear to auscultation. Respiratory effort normal. Cardiovascular system: S1 & S2 heard, RRR.  No JVD, murmurs, rubs, gallops or clicks. No pedal edema. Gastrointestinal system: Abdomen is nondistended, soft and nontender. No organomegaly or masses felt. Normal bowel sounds heard. Central nervous system: Alert and oriented. No focal neurological deficits. Extremities: Symmetric 5 x 5 power. Skin: No rashes, lesions or ulcers Psychiatry: Judgement and insight appear normal. Mood & affect appropriate.     Data Reviewed: I have personally reviewed following labs and imaging studies  CBC: Recent Labs  Lab 10/05/22 1341 10/06/22 0412  WBC 4.5 2.3*  HGB 9.9* 9.2*  HCT  32.1* 29.8*  MCV 97.3 95.2  PLT 128* 102*   Basic Metabolic Panel: Recent Labs  Lab 10/05/22 1341 10/05/22 1931 10/06/22 0412  NA 126*  --  136  K 2.8*  --  2.9*  CL 91*  --  101  CO2 22  --  26  GLUCOSE 92  --  97  BUN 55*  --  50*  CREATININE 3.96*  --  1.84*  CALCIUM 8.8*  --  8.5*  MG  --  1.0*  --    GFR: Estimated Creatinine Clearance: 32.7 mL/min (A) (by C-G formula based on SCr of 1.84 mg/dL (H)). Liver Function Tests: Recent Labs  Lab 10/05/22 1902 10/06/22 0412  AST 213* 154*  ALT 136* 119*  ALKPHOS 66 63  BILITOT 0.6 0.5  PROT 7.0 6.0*  ALBUMIN 4.0 3.2*   No results for input(s): "LIPASE", "AMYLASE" in the last 168 hours. No results for input(s): "AMMONIA" in the last 168 hours. Coagulation Profile: No results for input(s): "INR", "PROTIME" in the last 168 hours. Cardiac Enzymes: No results for input(s): "CKTOTAL", "CKMB", "CKMBINDEX", "TROPONINI" in the last 168 hours. BNP (last 3 results) No results for input(s): "PROBNP" in the last 8760 hours. HbA1C: Recent Labs    10/05/22 1341  HGBA1C 5.0   CBG: Recent Labs  Lab 10/05/22 2224 10/06/22 0806  GLUCAP 106* 86   Lipid Profile: No results for input(s): "CHOL", "HDL", "LDLCALC", "TRIG", "CHOLHDL", "LDLDIRECT" in the last 72 hours. Thyroid Function Tests: No results for input(s): "TSH", "T4TOTAL", "FREET4", "T3FREE", "THYROIDAB" in the last 72 hours. Anemia Panel: No results for input(s): "VITAMINB12", "FOLATE", "FERRITIN", "TIBC", "IRON", "RETICCTPCT" in the last 72 hours. Urine analysis:    Component Value Date/Time   COLORURINE YELLOW (A) 10/05/2022 1536   APPEARANCEUR HAZY (A) 10/05/2022 1536   LABSPEC 1.010 10/05/2022 1536   PHURINE 5.0 10/05/2022 1536   GLUCOSEU NEGATIVE 10/05/2022 1536   HGBUR NEGATIVE 10/05/2022 1536   BILIRUBINUR NEGATIVE 10/05/2022 1536   KETONESUR NEGATIVE 10/05/2022 1536   PROTEINUR NEGATIVE 10/05/2022 1536   NITRITE NEGATIVE 10/05/2022 1536   LEUKOCYTESUR  NEGATIVE 10/05/2022 1536   Sepsis Labs: @LABRCNTIP (procalcitonin:4,lacticidven:4)  )No results found for this or any previous visit (from the past 240 hour(s)).       Radiology Studies: US ABDOMEN LIMITED RUQ (LIVER/GB)  Result Date: 10/05/2022 CLINICAL DATA:  Pancreatitis. EXAM: ULTRASOUND ABDOMEN LIMITED RIGHT UPPER QUADRANT COMPARISON:  None Available. FINDINGS: Gallbladder: A 5.8 mm nonshadowing gallbladder polyp is seen along the nondependent wall of the gallbladder lumen. No gallstones or wall thickening visualized (2.1 mm). No sonographic Murphy sign noted by sonographer. Common bile duct: Diameter: 4.4 mm Liver: No focal lesion identified. Diffusely increased echogenicity of the liver parenchyma is noted. Portal vein is patent on color Doppler imaging with normal direction of blood flow towards the liver. Other: None. IMPRESSION: 1. 5.8 mm, likely benign gallbladder polyp. No follow-up imaging is recommended. This recommendation follows  ACR consensus guidelines: White Paper of the ACR Incidental Findings Committee II on Gallbladder and Biliary Findings. J Am Coll Radiol 2013:;10:953-956. 2. Hepatic steatosis without focal liver lesions. Electronically Signed   By: Aram Candela M.D.   On: 10/05/2022 20:45   CT Renal Stone Study  Result Date: 10/05/2022 CLINICAL DATA:  Bladder neck obstruction.  Acute kidney injury. EXAM: CT ABDOMEN AND PELVIS WITHOUT CONTRAST TECHNIQUE: Multidetector CT imaging of the abdomen and pelvis was performed following the standard protocol without IV contrast. RADIATION DOSE REDUCTION: This exam was performed according to the departmental dose-optimization program which includes automated exposure control, adjustment of the mA and/or kV according to patient size and/or use of iterative reconstruction technique. COMPARISON:  CT of the chest on 08/16/2022 FINDINGS: Lower chest: There are scattered emphysematous changes. Heart size is normal. Moderate hiatal  hernia. Hepatobiliary: The liver is enlarged, 20.2 centimeters in craniocaudal length. There is focal fatty infiltration adjacent to the falciform ligament. The liver parenchyma does not meet density criteria for a hepatic steatosis. No discrete liver lesion. Gallbladder is present. Pancreas: Unremarkable. No pancreatic ductal dilatation or surrounding inflammatory changes. Spleen: Normal in size without focal abnormality. Adrenals/Urinary Tract: Adrenal glands are normal. The kidneys are symmetric, without mass or hydronephrosis. No intrarenal or ureteral stones are present. The bladder and visualized portion of the urethra are normal. No evidence for pelvic floor laxity. Stomach/Bowel: Moderate hiatal hernia. Otherwise the stomach is normal in appearance. Small bowel loops are normal in caliber and wall thickness. There are numerous colonic diverticula but no acute diverticulitis. Appendix is normal in caliber, without surrounding inflammatory changes. Vascular/Lymphatic: There is atherosclerotic calcification of the abdominal aorta, not associated with aneurysm. No retroperitoneal or mesenteric adenopathy. Reproductive: The uterus is present.  No adnexal mass. Other: Abdominal wall is unremarkable.  No ascites. Musculoskeletal: No acute or significant osseous findings. IMPRESSION: 1. No evidence for urinary tract stone or obstruction. 2. Normal appearance of the bladder and visualized portion of the urethra. 3. Moderate hiatal hernia. 4. Hepatomegaly without evidence for hepatic steatosis. 5. Colonic diverticulosis. 6. Aortic Atherosclerosis (ICD10-I70.0) and Emphysema (ICD10-J43.9). Electronically Signed   By: Norva Pavlov M.D.   On: 10/05/2022 17:00        Scheduled Meds:  amiodarone  200 mg Oral BID   apixaban  5 mg Oral BID   insulin aspart  0-15 Units Subcutaneous TID WC   pantoprazole (PROTONIX) IV  40 mg Intravenous Q12H   sodium chloride flush  3 mL Intravenous Q12H   thiamine (VITAMIN B1)  injection  100 mg Intravenous Q24H   Continuous Infusions:  potassium chloride 10 mEq (10/06/22 0841)     LOS: 0 days     Silvano Bilis, MD [facility]   If 7PM-7AM, please contact night-coverage [email or web address] Password [facility] 10/06/2022, 9:11 AM

## 2022-10-06 NOTE — Assessment & Plan Note (Signed)
EKG today shows sinus rhythm at 81 with QTc of 453, ST elevation in V6 only. Will continue patient on amiodarone and Eliquis.

## 2022-10-06 NOTE — Assessment & Plan Note (Signed)
Current potassium of 2.2 Replacement started.

## 2022-10-06 NOTE — Assessment & Plan Note (Signed)
Patient's PPI therapy continued.

## 2022-10-07 DIAGNOSIS — N179 Acute kidney failure, unspecified: Secondary | ICD-10-CM | POA: Diagnosis not present

## 2022-10-07 LAB — BASIC METABOLIC PANEL
Calcium: 8.4 mg/dL — ABNORMAL LOW (ref 8.9–10.3)
Creatinine, Ser: 0.81 mg/dL (ref 0.44–1.00)
GFR, Estimated: >60 mL/min (ref >60)
Glucose, Bld: 91 mg/dL (ref 70–99)
Sodium: 133 mmol/L — ABNORMAL LOW (ref 135–145)

## 2022-10-07 MED ORDER — PANTOPRAZOLE SODIUM 40 MG PO TBEC: 40.0000 mg | DELAYED_RELEASE_TABLET | Freq: Every day | ORAL | Status: DC

## 2022-10-07 NOTE — Discharge Summary (Signed)
Anne Wells:[id number] DOB: December 20, 1950 DOA: 10/05/2022  PCP: Emogene Morgan, MD  Admit date: 10/05/2022 Discharge date: 10/07/2022  Time spent: 35 minutes  Recommendations for Outpatient Follow-up:  Pcp f/u tomorrow as scheduled Cardiology f/u  CMP at f/u to monitor kidney function, LFTs, and electrolytes    Discharge Diagnoses:  Principal Problem:   AKI (acute kidney injury) (HCC) Active Problems:   Hypokalemia   Transaminitis   Hyponatremia   COPD (chronic obstructive pulmonary disease) (HCC)   Hypomagnesemia   Atrial fibrillation (HCC)   GERD (gastroesophageal reflux disease)   Essential hypertension   Malignant neoplasm of right lung (HCC)   OSA (obstructive sleep apnea)   Chronic anticoagulation   Alcohol use disorder   Discharge Condition: stable  Diet recommendation: heart healthy  Filed Weights   10/05/22 2205 10/06/22 0412 10/07/22 0432  Weight: 89.1 kg 89.7 kg 90.8 kg    History of present illness:  From admission h and p Anne Wells is a 72 y.o. female with medical history significant for hypertension, A-fib with RVR, COPD, alcohol use disorder coming to [place] with low blood pressure or low heart rate and dizziness. Patient states that she send having some nausea vomiting and diarrhea for the past week no one else is sick in the house.  She denies any use of NSAIDs or Goody powders.  Family is at bedside and husband is worried that she received too many blood pressure medications from primary care physician.  Explained to family that low blood pressure can occur due to dehydration as well which is what I think is going on with his wife.  As is her kidney function is abnormal.  She does not report any chest pain palpitation fevers chills or any other complaints.   Hospital Course:   # AKI Sent to ER by PCPs office for this. Cr 3.96 on presentation, baseline is normal, improved to normal today with fluids.   Recent nausea/vomiting/diarrhea though that  resolved as of a week ago. Cardiology recently re-started multiple meds, suspect that played a big role here. - push fluids - med changes as below   # Hypokalemia # Hypomagnesemia Resolved with repletion  # Transaminitis Mild, improved with fluids, acute hepatitis panel negative, think likely dehydration/hypotension, same process as caused AKI - repeat LFTs at outpt f/u   # Paroxysmal a-fib Rate controlled here, we stopped home metop and dilt as patient hypotensive - cont home apixaban, amiodarone - holding home metop and dilt, advise cardiology f/u   # HTN Here bps low, asymptomatic - holding home enalapril, metop, dilt   # Lung cancer S/p radiation therapy, breathing stable - f/u oncology   # COPD Quiescent   # OSA Not on cpap    # Chronic pain - home gabapentin  Procedures: none   Consultations: none  Discharge Exam: Vitals:   10/07/22 0432 10/07/22 0728  BP: 126/65 103/61  Pulse: 72 71  Resp: 18   Temp: 97.6 F (36.4 C) 98 F (36.7 C)  SpO2: 98% 100%    General: NAD Cardiovascular: RRR Respiratory: CTAB  Discharge Instructions   Discharge Instructions     Diet - low sodium heart healthy   Complete by: As directed    Increase activity slowly   Complete by: As directed       Allergies as of 10/07/2022       Reactions   Shellfish Allergy Swelling   Swelling of the tongue   Penicillin G Rash  Medication List     STOP taking these medications    diltiazem 180 MG 24 hr capsule Commonly known as: CARDIZEM CD   enalapril 10 MG tablet Commonly known as: VASOTEC   metoprolol succinate 25 MG 24 hr tablet Commonly known as: TOPROL-XL       TAKE these medications    Acetaminophen 8 Hour 650 MG CR tablet Generic drug: acetaminophen Take 650-1,300 mg by mouth every 8 (eight) hours as needed for pain.   acyclovir 400 MG tablet Commonly known as: ZOVIRAX Take 400 mg by mouth 2 (two) times daily.   albuterol (2.5 MG/3ML)  0.083% nebulizer solution Commonly known as: PROVENTIL Inhale 3 mLs into the lungs every 6 (six) hours as needed. What changed: Another medication with the same name was changed. Make sure you understand how and when to take each.   albuterol 108 (90 Base) MCG/ACT inhaler Commonly known as: VENTOLIN HFA INHALE TWO (2) PUFFS BY MOUTH EVERY 6 HOURS AS NEEDED FOR WHEEZING OR FOR SHORTNESS OF BREATH What changed: See the new instructions.   amiodarone 200 MG tablet Commonly known as: PACERONE Take 1 tablet (200 mg total) by mouth 2 (two) times daily.   apixaban 5 MG Tabs tablet Commonly known as: ELIQUIS Take 1 tablet (5 mg total) by mouth 2 (two) times daily.   cyclobenzaprine 5 MG tablet Commonly known as: FLEXERIL Take 5 mg by mouth 2 (two) times daily.   gabapentin 600 MG tablet Commonly known as: NEURONTIN Take 600 mg by mouth 2 (two) times daily.   Multivitamin Adult (Minerals) Tabs Take 1 tablet by mouth daily.   omeprazole 20 MG capsule Commonly known as: PRILOSEC Take 20 mg by mouth daily.   Stiolto Respimat 2.5-2.5 MCG/ACT Aers Generic drug: Tiotropium Bromide-Olodaterol Inhale 2 Inhalers into the lungs daily at 12 noon.       Allergies  Allergen Reactions   Shellfish Allergy Swelling    Swelling of the tongue   Penicillin G Rash    Follow-up Information     Aycock, Ngwe A, MD Follow up.   Specialty: Family Medicine Why: tomorrow as scheduled Contact information: [address] [place] [address] [phone]         Laurier Nancy, MD Follow up.   Specialty: Cardiology Contact information: [address] [place] [address] [phone]                  The results of significant diagnostics from this hospitalization (including imaging, microbiology, ancillary and laboratory) are listed below for reference.    Significant Diagnostic Studies: US ABDOMEN LIMITED RUQ (LIVER/GB)  Result Date: 10/05/2022 CLINICAL DATA:   Pancreatitis. EXAM: ULTRASOUND ABDOMEN LIMITED RIGHT UPPER QUADRANT COMPARISON:  None Available. FINDINGS: Gallbladder: A 5.8 mm nonshadowing gallbladder polyp is seen along the nondependent wall of the gallbladder lumen. No gallstones or wall thickening visualized (2.1 mm). No sonographic Murphy sign noted by sonographer. Common bile duct: Diameter: 4.4 mm Liver: No focal lesion identified. Diffusely increased echogenicity of the liver parenchyma is noted. Portal vein is patent on color Doppler imaging with normal direction of blood flow towards the liver. Other: None. IMPRESSION: 1. 5.8 mm, likely benign gallbladder polyp. No follow-up imaging is recommended. This recommendation follows ACR consensus guidelines: White Paper of the ACR Incidental Findings Committee II on Gallbladder and Biliary Findings. J Am Coll Radiol 2013:;10:953-956. 2. Hepatic steatosis without focal liver lesions. Electronically Signed   By: Aram Candela M.D.   On:  10/05/2022 20:45   CT Renal Stone Study  Result Date: 10/05/2022 CLINICAL DATA:  Bladder neck obstruction.  Acute kidney injury. EXAM: CT ABDOMEN AND PELVIS WITHOUT CONTRAST TECHNIQUE: Multidetector CT imaging of the abdomen and pelvis was performed following the standard protocol without IV contrast. RADIATION DOSE REDUCTION: This exam was performed according to the departmental dose-optimization program which includes automated exposure control, adjustment of the mA and/or kV according to patient size and/or use of iterative reconstruction technique. COMPARISON:  CT of the chest on 08/16/2022 FINDINGS: Lower chest: There are scattered emphysematous changes. Heart size is normal. Moderate hiatal hernia. Hepatobiliary: The liver is enlarged, 20.2 centimeters in craniocaudal length. There is focal fatty infiltration adjacent to the falciform ligament. The liver parenchyma does not meet density criteria for a hepatic steatosis. No discrete liver lesion. Gallbladder is  present. Pancreas: Unremarkable. No pancreatic ductal dilatation or surrounding inflammatory changes. Spleen: Normal in size without focal abnormality. Adrenals/Urinary Tract: Adrenal glands are normal. The kidneys are symmetric, without mass or hydronephrosis. No intrarenal or ureteral stones are present. The bladder and visualized portion of the urethra are normal. No evidence for pelvic floor laxity. Stomach/Bowel: Moderate hiatal hernia. Otherwise the stomach is normal in appearance. Small bowel loops are normal in caliber and wall thickness. There are numerous colonic diverticula but no acute diverticulitis. Appendix is normal in caliber, without surrounding inflammatory changes. Vascular/Lymphatic: There is atherosclerotic calcification of the abdominal aorta, not associated with aneurysm. No retroperitoneal or mesenteric adenopathy. Reproductive: The uterus is present.  No adnexal mass. Other: Abdominal wall is unremarkable.  No ascites. Musculoskeletal: No acute or significant osseous findings. IMPRESSION: 1. No evidence for urinary tract stone or obstruction. 2. Normal appearance of the bladder and visualized portion of the urethra. 3. Moderate hiatal hernia. 4. Hepatomegaly without evidence for hepatic steatosis. 5. Colonic diverticulosis. 6. Aortic Atherosclerosis (ICD10-I70.0) and Emphysema (ICD10-J43.9). Electronically Signed   By: Norva Pavlov M.D.   On: 10/05/2022 17:00    Microbiology: No results found for this or any previous visit (from the past 240 hour(s)).   Labs: Basic Metabolic Panel: Recent Labs  Lab 10/05/22 1341 10/05/22 1931 10/06/22 0412 10/06/22 1452 10/07/22 0442  NA 126*  --  136 138 133*  K 2.8*  --  2.9* 5.1 4.1  CL 91*  --  101 104 104  CO2 22  --  26 27 21*  GLUCOSE 92  --  97 90 91  BUN 55*  --  50* 39* 26*  CREATININE 3.96*  --  1.84* 1.15* 0.81  CALCIUM 8.8*  --  8.5* 9.0 8.4*  MG  --  1.0* 2.1  --   --    Liver Function Tests: Recent Labs  Lab  10/05/22 1902 10/06/22 0412  AST 213* 154*  ALT 136* 119*  ALKPHOS 66 63  BILITOT 0.6 0.5  PROT 7.0 6.0*  ALBUMIN 4.0 3.2*   No results for input(s): "LIPASE", "AMYLASE" in the last 168 hours. No results for input(s): "AMMONIA" in the last 168 hours. CBC: Recent Labs  Lab 10/05/22 1341 10/06/22 0412  WBC 4.5 2.3*  HGB 9.9* 9.2*  HCT 32.1* 29.8*  MCV 97.3 95.2  PLT 128* 102*   Cardiac Enzymes: No results for input(s): "CKTOTAL", "CKMB", "CKMBINDEX", "TROPONINI" in the last 168 hours. BNP: BNP (last 3 results) No results for input(s): "BNP" in the last 8760 hours.  ProBNP (last 3 results) No results for input(s): "PROBNP" in the last 8760 hours.  CBG: Recent Labs  Lab 10/05/22 2224 10/06/22 0806  GLUCAP 106* 86       Signed:  Silvano Bilis MD.  [facility] 10/07/2022, 9:01 AM

## 2022-10-10 LAB — BASIC METABOLIC PANEL
Anion gap: 13 (ref 5–15)
BUN: 26 mg/dL — ABNORMAL HIGH (ref 8–23)
CO2: 21 mmol/L — ABNORMAL LOW (ref 22–32)
Chloride: 104 mmol/L (ref 98–111)
Potassium: 4.1 mmol/L (ref 3.5–5.1)

## 2022-10-11 ENCOUNTER — Encounter: Payer: Self-pay | Admitting: Cardiovascular Disease

## 2022-10-11 ENCOUNTER — Ambulatory Visit (INDEPENDENT_AMBULATORY_CARE_PROVIDER_SITE_OTHER): Payer: 59 | Admitting: Cardiovascular Disease

## 2022-10-11 VITALS — BP 125/80 | HR 91 | Ht 69.0 in | Wt 195.8 lb

## 2022-10-11 DIAGNOSIS — I480 Paroxysmal atrial fibrillation: Secondary | ICD-10-CM | POA: Diagnosis not present

## 2022-10-11 DIAGNOSIS — C3411 Malignant neoplasm of upper lobe, right bronchus or lung: Secondary | ICD-10-CM | POA: Diagnosis not present

## 2022-10-11 DIAGNOSIS — I10 Essential (primary) hypertension: Secondary | ICD-10-CM | POA: Diagnosis not present

## 2022-10-11 DIAGNOSIS — G4733 Obstructive sleep apnea (adult) (pediatric): Secondary | ICD-10-CM

## 2022-10-11 NOTE — Progress Notes (Signed)
Cardiology Office Note   Date:  10/11/2022   ID:  Anne Wells, DOB [birth year], MRN [id number]  PCP:  Emogene Morgan, MD  Cardiologist:  Adrian Blackwater, MD      History of Present Illness: Anne Wells is a 72 y.o. female who presents for  Chief Complaint  Patient presents with   Follow-up    1 mo F/u    Has OB and right sided chest pain, but lung nodule on right side as getting TR      Past Medical History:  Diagnosis Date   Atrial fibrillation with RVR (HCC) 02/21/2021   COPD (chronic obstructive pulmonary disease) (HCC)      Past Surgical History:  Procedure Laterality Date   Dental procedure       Current Outpatient Medications  Medication Sig Dispense Refill   ACETAMINOPHEN 8 HOUR 650 MG CR tablet Take 650-1,300 mg by mouth every 8 (eight) hours as needed for pain.     acyclovir (ZOVIRAX) 400 MG tablet Take 400 mg by mouth 2 (two) times daily.     albuterol (PROVENTIL) (2.5 MG/3ML) 0.083% nebulizer solution Inhale 3 mLs into the lungs every 6 (six) hours as needed.     albuterol (VENTOLIN HFA) 108 (90 Base) MCG/ACT inhaler INHALE TWO (2) PUFFS BY MOUTH EVERY 6 HOURS AS NEEDED FOR WHEEZING OR FOR SHORTNESS OF BREATH (Patient taking differently: Inhale 2 puffs into the lungs every 6 (six) hours as needed for wheezing or shortness of breath.) 8.5 g 10   amiodarone (PACERONE) 200 MG tablet Take 1 tablet (200 mg total) by mouth 2 (two) times daily. 60 tablet 0   apixaban (ELIQUIS) 5 MG TABS tablet Take 1 tablet (5 mg total) by mouth 2 (two) times daily. 60 tablet 0   cyclobenzaprine (FLEXERIL) 5 MG tablet Take 5 mg by mouth 2 (two) times daily.     gabapentin (NEURONTIN) 600 MG tablet Take 600 mg by mouth 2 (two) times daily.     Multiple Vitamins-Minerals (MULTIVITAMIN ADULT, MINERALS,) TABS Take 1 tablet by mouth daily.     omeprazole (PRILOSEC) 20 MG capsule Take 20 mg by mouth daily.     Tiotropium Bromide-Olodaterol (STIOLTO RESPIMAT) 2.5-2.5 MCG/ACT AERS  Inhale 2 Inhalers into the lungs daily at 12 noon.     No current facility-administered medications for this visit.    Allergies:   Shellfish allergy and Penicillin g    Social History:   reports that she quit smoking about 18 years ago. Her smoking use included cigarettes. She has a 35.00 pack-year smoking history. She has never used smokeless tobacco. She reports current alcohol use. She reports that she does not use drugs.   Family History:  family history includes Bone cancer in her brother and mother; COPD in her brother and father.    ROS:     Review of Systems  Constitutional: Negative.   HENT: Negative.    Eyes: Negative.   Respiratory: Negative.    Gastrointestinal: Negative.   Genitourinary: Negative.   Musculoskeletal: Negative.   Skin: Negative.   Neurological: Negative.   Endo/Heme/Allergies: Negative.   Psychiatric/Behavioral: Negative.    All other systems reviewed and are negative.     All other systems are reviewed and negative.    PHYSICAL EXAM: VS:  BP 125/80   Pulse 91   Ht 5\' 9"  (1.753 m)   Wt 195 lb 12.8 oz (88.8 kg)   SpO2 94%   BMI 28.91 kg/m  ,  BMI Body mass index is 28.91 kg/m. Last weight:  Wt Readings from Last 3 Encounters:  10/11/22 195 lb 12.8 oz (88.8 kg)  10/07/22 200 lb 2.8 oz (90.8 kg)  09/06/22 202 lb 6.4 oz (91.8 kg)     Physical Exam Constitutional:      Appearance: Normal appearance.  Cardiovascular:     Rate and Rhythm: Normal rate and regular rhythm.     Heart sounds: Normal heart sounds.  Pulmonary:     Effort: Pulmonary effort is normal.     Breath sounds: Normal breath sounds.  Musculoskeletal:     Right lower leg: No edema.     Left lower leg: No edema.  Neurological:     Mental Status: She is alert.       EKG:   Recent Labs: 10/06/2022: ALT 119; Hemoglobin 9.2; Magnesium 2.1; Platelets 102 10/07/2022: BUN 26; Creatinine, Ser 0.81; Potassium 4.1; Sodium 138    Lipid Panel    Component Value  Date/Time   CHOL 355 (H) 02/22/2021 0403   TRIG 51 02/22/2021 0403   HDL 179 02/22/2021 0403   CHOLHDL 2.0 02/22/2021 0403   VLDL 10 02/22/2021 0403   LDLCALC 166 (H) 02/22/2021 0403      Other studies Reviewed: Additional studies/ records that were reviewed today include:  Review of the above records demonstrates:       No data to display            ASSESSMENT AND PLAN:    ICD-10-CM   1. Paroxysmal atrial fibrillation (HCC)  I48.0 PCV ECHOCARDIOGRAM COMPLETE   will get echo    2. Essential hypertension  I10 PCV ECHOCARDIOGRAM COMPLETE    3. Malignant neoplasm of upper lobe of right lung (HCC)  C34.11 PCV ECHOCARDIOGRAM COMPLETE    4. OSA (obstructive sleep apnea)  G47.33 PCV ECHOCARDIOGRAM COMPLETE       Problem List Items Addressed This Visit       Cardiovascular and Mediastinum   Atrial fibrillation (HCC) - Primary   Relevant Orders   PCV ECHOCARDIOGRAM COMPLETE   Essential hypertension   Relevant Orders   PCV ECHOCARDIOGRAM COMPLETE     Respiratory   Malignant neoplasm of right lung (HCC)   Relevant Orders   PCV ECHOCARDIOGRAM COMPLETE   OSA (obstructive sleep apnea)   Relevant Orders   PCV ECHOCARDIOGRAM COMPLETE       Disposition:   Return in about 4 weeks (around 11/08/2022) for echo and f/u.    Total time spent: 30 minutes  Signed,  Adrian Blackwater, MD  10/11/2022 11:24 AM    [facility]

## 2022-10-17 ENCOUNTER — Telehealth: Payer: Self-pay

## 2022-10-23 ENCOUNTER — Institutional Professional Consult (permissible substitution): Payer: 59 | Admitting: Pulmonary Disease

## 2022-10-24 ENCOUNTER — Encounter: Payer: Self-pay | Admitting: Pulmonary Disease

## 2022-10-29 NOTE — Telephone Encounter (Signed)
Spoke with Anne Wells at [facility] and informed nurse that patient has not responded to any voicemail's but does have an echo appointment with [place] on 7/18 and will still need a F/U appointment if she could assist [place] with having patient schedule appt.

## 2022-10-31 ENCOUNTER — Other Ambulatory Visit: Payer: Self-pay

## 2022-10-31 ENCOUNTER — Other Ambulatory Visit: Payer: Self-pay | Admitting: Cardiovascular Disease

## 2022-10-31 ENCOUNTER — Inpatient Hospital Stay
Admission: EM | Admit: 2022-10-31 | Discharge: 2022-11-03 | DRG: 683 | Disposition: A | Discharge status: Home / Self Care | Payer: 59 | Attending: Internal Medicine | Admitting: Internal Medicine

## 2022-10-31 ENCOUNTER — Emergency Department: Payer: 59

## 2022-10-31 DIAGNOSIS — D709 Neutropenia, unspecified: Secondary | ICD-10-CM | POA: Insufficient documentation

## 2022-10-31 DIAGNOSIS — G4733 Obstructive sleep apnea (adult) (pediatric): Secondary | ICD-10-CM | POA: Diagnosis present

## 2022-10-31 DIAGNOSIS — I480 Paroxysmal atrial fibrillation: Secondary | ICD-10-CM | POA: Diagnosis present

## 2022-10-31 DIAGNOSIS — R7401 Elevation of levels of liver transaminase levels: Secondary | ICD-10-CM | POA: Diagnosis not present

## 2022-10-31 DIAGNOSIS — Z87891 Personal history of nicotine dependence: Secondary | ICD-10-CM

## 2022-10-31 DIAGNOSIS — E860 Dehydration: Secondary | ICD-10-CM | POA: Diagnosis present

## 2022-10-31 DIAGNOSIS — I959 Hypotension, unspecified: Secondary | ICD-10-CM | POA: Diagnosis present

## 2022-10-31 DIAGNOSIS — Z7901 Long term (current) use of anticoagulants: Secondary | ICD-10-CM

## 2022-10-31 DIAGNOSIS — Z923 Personal history of irradiation: Secondary | ICD-10-CM

## 2022-10-31 DIAGNOSIS — I10 Essential (primary) hypertension: Secondary | ICD-10-CM | POA: Diagnosis present

## 2022-10-31 DIAGNOSIS — Z79899 Other long term (current) drug therapy: Secondary | ICD-10-CM

## 2022-10-31 DIAGNOSIS — D61818 Other pancytopenia: Secondary | ICD-10-CM | POA: Diagnosis present

## 2022-10-31 DIAGNOSIS — I9589 Other hypotension: Secondary | ICD-10-CM | POA: Diagnosis not present

## 2022-10-31 DIAGNOSIS — F1010 Alcohol abuse, uncomplicated: Secondary | ICD-10-CM

## 2022-10-31 DIAGNOSIS — Y900 Blood alcohol level of less than 20 mg/100 ml: Secondary | ICD-10-CM | POA: Diagnosis present

## 2022-10-31 DIAGNOSIS — Z1152 Encounter for screening for COVID-19: Secondary | ICD-10-CM

## 2022-10-31 DIAGNOSIS — D6959 Other secondary thrombocytopenia: Secondary | ICD-10-CM | POA: Diagnosis present

## 2022-10-31 DIAGNOSIS — E876 Hypokalemia: Secondary | ICD-10-CM | POA: Diagnosis not present

## 2022-10-31 DIAGNOSIS — N179 Acute kidney failure, unspecified: Secondary | ICD-10-CM | POA: Diagnosis not present

## 2022-10-31 DIAGNOSIS — K760 Fatty (change of) liver, not elsewhere classified: Secondary | ICD-10-CM | POA: Diagnosis present

## 2022-10-31 DIAGNOSIS — D693 Immune thrombocytopenic purpura: Secondary | ICD-10-CM

## 2022-10-31 DIAGNOSIS — Z880 Allergy status to penicillin: Secondary | ICD-10-CM

## 2022-10-31 DIAGNOSIS — I4891 Unspecified atrial fibrillation: Secondary | ICD-10-CM | POA: Diagnosis present

## 2022-10-31 DIAGNOSIS — F1090 Alcohol use, unspecified, uncomplicated: Secondary | ICD-10-CM

## 2022-10-31 DIAGNOSIS — F10239 Alcohol dependence with withdrawal, unspecified: Secondary | ICD-10-CM | POA: Diagnosis present

## 2022-10-31 DIAGNOSIS — E872 Acidosis, unspecified: Secondary | ICD-10-CM | POA: Diagnosis not present

## 2022-10-31 DIAGNOSIS — Z91013 Allergy to seafood: Secondary | ICD-10-CM

## 2022-10-31 DIAGNOSIS — Z85118 Personal history of other malignant neoplasm of bronchus and lung: Secondary | ICD-10-CM

## 2022-10-31 DIAGNOSIS — Z825 Family history of asthma and other chronic lower respiratory diseases: Secondary | ICD-10-CM

## 2022-10-31 DIAGNOSIS — C3491 Malignant neoplasm of unspecified part of right bronchus or lung: Secondary | ICD-10-CM | POA: Diagnosis present

## 2022-10-31 DIAGNOSIS — J449 Chronic obstructive pulmonary disease, unspecified: Secondary | ICD-10-CM | POA: Diagnosis present

## 2022-10-31 LAB — CBC WITH DIFFERENTIAL/PLATELET
Abs Immature Granulocytes: 0.03 10*3/uL (ref 0.00–0.07)
Basophils Absolute: 0 10*3/uL (ref 0.0–0.1)
Basophils Relative: 0 %
Eosinophils Absolute: 0 10*3/uL (ref 0.0–0.5)
Eosinophils Relative: 1 %
HCT: 38 % (ref 36.0–46.0)
Hemoglobin: 12 g/dL (ref 12.0–15.0)
Immature Granulocytes: 1 %
Lymphocytes Relative: 18 %
Lymphs Abs: 0.8 10*3/uL (ref 0.7–4.0)
MCH: 30.5 pg (ref 26.0–34.0)
MCHC: 31.6 g/dL (ref 30.0–36.0)
MCV: 96.7 fL (ref 80.0–100.0)
Monocytes Absolute: 0.3 10*3/uL (ref 0.1–1.0)
Monocytes Relative: 7 %
Neutro Abs: 3.5 10*3/uL (ref 1.7–7.7)
Neutrophils Relative %: 73 %
Platelets: 109 10*3/uL — ABNORMAL LOW (ref 150–400)
RBC: 3.93 MIL/uL (ref 3.87–5.11)
RDW: 16.7 % — ABNORMAL HIGH (ref 11.5–15.5)
WBC: 4.7 10*3/uL (ref 4.0–10.5)
nRBC: 0 % (ref 0.0–0.2)

## 2022-10-31 LAB — URINALYSIS, W/ REFLEX TO CULTURE (INFECTION SUSPECTED)
Bilirubin Urine: NEGATIVE
Glucose, UA: NEGATIVE mg/dL
Ketones, ur: NEGATIVE mg/dL
Nitrite: NEGATIVE
Protein, ur: NEGATIVE mg/dL
Specific Gravity, Urine: 1.006 (ref 1.005–1.030)
pH: 5 (ref 5.0–8.0)

## 2022-10-31 LAB — COMPREHENSIVE METABOLIC PANEL
ALT: 52 U/L — ABNORMAL HIGH (ref 0–44)
AST: 79 U/L — ABNORMAL HIGH (ref 15–41)
Albumin: 4.4 g/dL (ref 3.5–5.0)
Alkaline Phosphatase: 77 U/L (ref 38–126)
Anion gap: 16 — ABNORMAL HIGH (ref 5–15)
BUN: 27 mg/dL — ABNORMAL HIGH (ref 8–23)
CO2: 25 mmol/L (ref 22–32)
Calcium: 9.9 mg/dL (ref 8.9–10.3)
Chloride: 90 mmol/L — ABNORMAL LOW (ref 98–111)
Creatinine, Ser: 4.16 mg/dL — ABNORMAL HIGH (ref 0.44–1.00)
GFR, Estimated: 11 mL/min — ABNORMAL LOW (ref >60)
Glucose, Bld: 112 mg/dL — ABNORMAL HIGH (ref 70–99)
Potassium: 3.5 mmol/L (ref 3.5–5.1)
Sodium: 131 mmol/L — ABNORMAL LOW (ref 135–145)
Total Bilirubin: 0.9 mg/dL (ref 0.3–1.2)
Total Protein: 8.2 g/dL — ABNORMAL HIGH (ref 6.5–8.1)

## 2022-10-31 LAB — ETHANOL: Alcohol, Ethyl (B): <10 mg/dL (ref <10)

## 2022-10-31 LAB — PROTIME-INR
INR: 1.5 — ABNORMAL HIGH (ref 0.8–1.2)
Prothrombin Time: 17.9 seconds — ABNORMAL HIGH (ref 11.4–15.2)

## 2022-10-31 LAB — RESP PANEL BY RT-PCR (RSV, FLU A&B, COVID)  RVPGX2
Influenza A by PCR: NEGATIVE
Influenza B by PCR: NEGATIVE
Resp Syncytial Virus by PCR: NEGATIVE
SARS Coronavirus 2 by RT PCR: NEGATIVE

## 2022-10-31 LAB — LACTIC ACID, PLASMA
Lactic Acid, Venous: 2.7 mmol/L (ref 0.5–1.9)
Lactic Acid, Venous: 2 mmol/L (ref 0.5–1.9)

## 2022-10-31 LAB — BASIC METABOLIC PANEL
Anion gap: 12 (ref 5–15)
BUN: 26 mg/dL — ABNORMAL HIGH (ref 8–23)
CO2: 24 mmol/L (ref 22–32)
Calcium: 9 mg/dL (ref 8.9–10.3)
Chloride: 96 mmol/L — ABNORMAL LOW (ref 98–111)
Creatinine, Ser: 2.58 mg/dL — ABNORMAL HIGH (ref 0.44–1.00)
GFR, Estimated: 19 mL/min — ABNORMAL LOW (ref >60)
Glucose, Bld: 132 mg/dL — ABNORMAL HIGH (ref 70–99)
Potassium: 3.2 mmol/L — ABNORMAL LOW (ref 3.5–5.1)
Sodium: 132 mmol/L — ABNORMAL LOW (ref 135–145)

## 2022-10-31 LAB — APTT: aPTT: 31 seconds (ref 24–36)

## 2022-10-31 MED ORDER — ADULT MULTIVITAMIN W/MINERALS CH
1.0000 | ORAL_TABLET | Freq: Every day | ORAL | Status: DC
  Administered 2022-10-31 – 2022-11-03 (×4): 1 via ORAL
  Filled 2022-10-31 (×4): qty 1

## 2022-10-31 MED ORDER — ONDANSETRON HCL 4 MG/2ML IJ SOLN
4.0000 mg | Freq: Four times a day (QID) | INTRAMUSCULAR | Status: DC | PRN
  Administered 2022-11-01: 4 mg via INTRAVENOUS
  Filled 2022-10-31: qty 2

## 2022-10-31 MED ORDER — LACTATED RINGERS IV BOLUS (SEPSIS)
1000.0000 mL | Freq: Once | INTRAVENOUS | Status: DC
  Administered 2022-10-31: 1000 mL via INTRAVENOUS

## 2022-10-31 MED ORDER — LACTATED RINGERS IV BOLUS (SEPSIS)
1000.0000 mL | Freq: Once | INTRAVENOUS | Status: AC
  Administered 2022-10-31: 1000 mL via INTRAVENOUS

## 2022-10-31 MED ORDER — ACETAMINOPHEN 650 MG RE SUPP: 650.0000 mg | Freq: Four times a day (QID) | RECTAL | Status: DC | PRN

## 2022-10-31 MED ORDER — ONDANSETRON HCL 4 MG PO TABS: 4.0000 mg | ORAL_TABLET | Freq: Four times a day (QID) | ORAL | Status: DC | PRN

## 2022-10-31 MED ORDER — LACTATED RINGERS IV SOLN: INTRAVENOUS | Status: AC

## 2022-10-31 MED ORDER — FOLIC ACID 1 MG PO TABS
1.0000 mg | ORAL_TABLET | Freq: Every day | ORAL | Status: DC
  Administered 2022-10-31 – 2022-11-03 (×4): 1 mg via ORAL
  Filled 2022-10-31 (×4): qty 1

## 2022-10-31 MED ORDER — UMECLIDINIUM BROMIDE 62.5 MCG/ACT IN AEPB
1.0000 | INHALATION_SPRAY | Freq: Every day | RESPIRATORY_TRACT | Status: DC
  Administered 2022-11-01 – 2022-11-03 (×3): 1 via RESPIRATORY_TRACT
  Filled 2022-10-31 (×2): qty 7

## 2022-10-31 MED ORDER — LACTATED RINGERS IV SOLN: INTRAVENOUS | Status: DC

## 2022-10-31 MED ORDER — APIXABAN 5 MG PO TABS
5.0000 mg | ORAL_TABLET | Freq: Two times a day (BID) | ORAL | Status: DC
  Administered 2022-10-31 – 2022-11-03 (×6): 5 mg via ORAL
  Filled 2022-10-31 (×6): qty 1

## 2022-10-31 MED ORDER — THIAMINE MONONITRATE 100 MG PO TABS
100.0000 mg | ORAL_TABLET | Freq: Every day | ORAL | Status: DC
  Administered 2022-10-31 – 2022-11-03 (×4): 100 mg via ORAL
  Filled 2022-10-31 (×4): qty 1

## 2022-10-31 MED ORDER — FAMOTIDINE 20 MG PO TABS
10.0000 mg | ORAL_TABLET | Freq: Every day | ORAL | Status: DC | PRN
  Administered 2022-11-01 – 2022-11-02 (×3): 10 mg via ORAL
  Filled 2022-10-31 (×5): qty 1

## 2022-10-31 MED ORDER — ACETAMINOPHEN 325 MG PO TABS
650.0000 mg | ORAL_TABLET | Freq: Four times a day (QID) | ORAL | Status: DC | PRN
  Administered 2022-11-01 (×2): 650 mg via ORAL
  Filled 2022-10-31 (×2): qty 2

## 2022-10-31 MED ORDER — SODIUM CHLORIDE 0.9 % IV SOLN
2.0000 g | INTRAVENOUS | Status: DC
  Administered 2022-10-31: 2 g via INTRAVENOUS
  Filled 2022-10-31 (×2): qty 20

## 2022-10-31 MED ORDER — ALBUTEROL SULFATE (2.5 MG/3ML) 0.083% IN NEBU: 3.0000 mL | INHALATION_SOLUTION | Freq: Four times a day (QID) | RESPIRATORY_TRACT | Status: DC | PRN

## 2022-10-31 MED ORDER — POTASSIUM CHLORIDE CRYS ER 20 MEQ PO TBCR
40.0000 meq | EXTENDED_RELEASE_TABLET | Freq: Once | ORAL | Status: AC
  Administered 2022-10-31: 40 meq via ORAL
  Filled 2022-10-31: qty 2

## 2022-10-31 MED ORDER — SODIUM CHLORIDE 0.9% FLUSH
3.0000 mL | Freq: Two times a day (BID) | INTRAVENOUS | Status: DC
  Administered 2022-10-31 – 2022-11-03 (×5): 3 mL via INTRAVENOUS

## 2022-10-31 MED ORDER — ARFORMOTEROL TARTRATE 15 MCG/2ML IN NEBU
15.0000 ug | INHALATION_SOLUTION | Freq: Two times a day (BID) | RESPIRATORY_TRACT | Status: DC
  Administered 2022-11-01 – 2022-11-03 (×4): 15 ug via RESPIRATORY_TRACT
  Filled 2022-10-31 (×7): qty 2

## 2022-10-31 MED ORDER — AMIODARONE HCL 200 MG PO TABS
200.0000 mg | ORAL_TABLET | Freq: Two times a day (BID) | ORAL | Status: DC
  Administered 2022-10-31 – 2022-11-03 (×6): 200 mg via ORAL
  Filled 2022-10-31 (×6): qty 1

## 2022-10-31 NOTE — ED Triage Notes (Signed)
Patient states no urinary output in 24 hours, low back pain and vomiting.

## 2022-10-31 NOTE — Assessment & Plan Note (Signed)
Patient is presenting with 3-day history of anuria and elevated creatinine at 4.1 with GFR of 11.  BUN is 27 concerning for intrarenal pathology.  Given hypotension, potentially ATN.  Differential also includes hypovolemia in the setting of recent heavy alcohol use. Since receiving 2 L of IV fluids in the ED, patient has made approximately 300 cc of urine.  No evidence of retention on bladder scan.  - Repeat BMP in the a.m. - Strict in and out - Daily weights - Continue maintenance fluids - If no improvement in urine output or creatinine, consider nephrology consultation tomorrow

## 2022-10-31 NOTE — Assessment & Plan Note (Signed)
Patient has a history of lung cancer s/p radiation.  No contribution to current presentation.

## 2022-10-31 NOTE — ED Provider Notes (Signed)
[facility] Provider Note   Event Date/Time   First MD Initiated Contact with Patient 10/31/22 1152     (approximate) History  Emesis  HPI Anne Wells is a 72 y.o. female with stated past medical history of atrial fibrillation, COPD, and alcohol use disorder who presents complaining of decreased urinary output over the past 24 hours, bilateral CVA pain, and vomiting.  Patient states that she has been heavily drinking alcohol over the past 3 days and after stopping began having emesis and back pain with decreased urinary output.  Patient states that this has happened in the past when she has been drinking and caused kidney injury. ROS: Patient currently denies any vision changes, tinnitus, difficulty speaking, facial droop, sore throat, chest pain, shortness of breath, abdominal pain, nausea/vomiting/diarrhea, dysuria, or weakness/numbness/paresthesias in any extremity   Physical Exam  Triage Vital Signs: ED Triage Vitals  Encounter Vitals Group     BP 10/31/22 1132 (!) 89/51     Systolic BP Percentile --      Diastolic BP Percentile --      Pulse Rate 10/31/22 1136 75     Resp 10/31/22 1136 20     Temp 10/31/22 1136 98 F (36.7 C)     Temp Source 10/31/22 1136 Oral     SpO2 10/31/22 1136 97 %     Weight 10/31/22 1137 196 lb (88.9 kg)     Height 10/31/22 1137 5\' 8"  (1.727 m)     Head Circumference --      Peak Flow --      Pain Score --      Pain Loc --      Pain Education --      Exclude from Growth Chart --    Most recent vital signs: Vitals:   10/31/22 1430 10/31/22 1530  BP: (!) 111/57   Pulse: 72 85  Resp: 18 19  Temp:    SpO2: 100% 99%   General: Awake, oriented x4. CV:  Good peripheral perfusion.  Resp:  Normal effort.  Abd:  No distention.  Other:  Elderly African-American overweight female laying in bed in no acute distress ED Results / Procedures / Treatments  Labs (all labs ordered are listed, but only abnormal results are  displayed) Labs Reviewed  LACTIC ACID, PLASMA - Abnormal; Notable for the following components:      Result Value   Lactic Acid, Venous 2.7 (*)    All other components within normal limits  LACTIC ACID, PLASMA - Abnormal; Notable for the following components:   Lactic Acid, Venous 2.0 (*)    All other components within normal limits  COMPREHENSIVE METABOLIC PANEL - Abnormal; Notable for the following components:   Sodium 131 (*)    Chloride 90 (*)    Glucose, Bld 112 (*)    BUN 27 (*)    Creatinine, Ser 4.16 (*)    Total Protein 8.2 (*)    AST 79 (*)    ALT 52 (*)    GFR, Estimated 11 (*)    Anion gap 16 (*)    All other components within normal limits  CBC WITH DIFFERENTIAL/PLATELET - Abnormal; Notable for the following components:   RDW 16.7 (*)    Platelets 109 (*)    All other components within normal limits  PROTIME-INR - Abnormal; Notable for the following components:   Prothrombin Time 17.9 (*)    INR 1.5 (*)    All other components within normal  limits  URINALYSIS, W/ REFLEX TO CULTURE (INFECTION SUSPECTED) - Abnormal; Notable for the following components:   Color, Urine YELLOW (*)    APPearance HAZY (*)    Hgb urine dipstick SMALL (*)    Leukocytes,Ua TRACE (*)    Bacteria, UA RARE (*)    All other components within normal limits  RESP PANEL BY RT-PCR (RSV, FLU A&B, COVID)  [id number]  CULTURE, BLOOD (ROUTINE X 2)  CULTURE, BLOOD (ROUTINE X 2)  APTT  ETHANOL   EKG ED ECG REPORT I, Merwyn Katos, the attending physician, personally viewed and interpreted this ECG. Date: 10/31/2022 EKG Time: 1137 Rate: 78 Rhythm: normal sinus rhythm QRS Axis: normal Intervals: normal ST/T Wave abnormalities: normal Narrative Interpretation: no evidence of acute ischemia RADIOLOGY ED MD interpretation: Single view portable chest x-ray shows a left retrocardiac rounded opacity likely hiatal hernia.  No evidence of acute abnormalities -Agree with radiology assessment Official  radiology report(s): DG Chest Port 1 View  Result Date: 10/31/2022 CLINICAL DATA:  Oliguria, low back pain, and vomiting EXAM: PORTABLE CHEST 1 VIEW COMPARISON:  Chest radiograph dated 07/01/2022 FINDINGS: Similar right upper lobe scarring. Left retrocardiac rounded opacity. No focal consolidations. No pleural effusion or pneumothorax. Enlarged cardiomediastinal silhouette is likely projectional. No acute osseous abnormality. IMPRESSION: Left retrocardiac rounded opacity, likely hiatal hernia. Otherwise, no focal consolidations. Electronically Signed   By: Agustin Cree M.D.   On: 10/31/2022 12:44   PROCEDURES: Critical Care performed: No .1-3 Lead EKG Interpretation  Performed by: Merwyn Katos, MD Authorized by: Merwyn Katos, MD     Interpretation: normal     ECG rate:  71   ECG rate assessment: normal     Rhythm: sinus rhythm     Ectopy: none     Conduction: normal    MEDICATIONS ORDERED IN ED: Medications  lactated ringers infusion (has no administration in time range)  lactated ringers bolus 1,000 mL (0 mLs Intravenous Stopped 10/31/22 1357)    And  lactated ringers bolus 1,000 mL (1,000 mLs Intravenous New Bag/Given 10/31/22 1209)    And  lactated ringers bolus 1,000 mL (has no administration in time range)  cefTRIAXone (ROCEPHIN) 2 g in sodium chloride 0.9 % 100 mL IVPB (0 g Intravenous Stopped 10/31/22 1308)  sodium chloride flush (NS) 0.9 % injection 3 mL (has no administration in time range)  acetaminophen (TYLENOL) tablet 650 mg (has no administration in time range)    Or  acetaminophen (TYLENOL) suppository 650 mg (has no administration in time range)  ondansetron (ZOFRAN) tablet 4 mg (has no administration in time range)    Or  ondansetron (ZOFRAN) injection 4 mg (has no administration in time range)  folic acid (FOLVITE) tablet 1 mg (has no administration in time range)  multivitamin with minerals tablet 1 tablet (has no administration in time range)  thiamine (VITAMIN  B1) tablet 100 mg (has no administration in time range)   IMPRESSION / MDM / ASSESSMENT AND PLAN / ED COURSE  I reviewed the triage vital signs and the nursing notes.                             The patient is on the cardiac monitor to evaluate for evidence of arrhythmia and/or significant heart rate changes. Patient's presentation is most consistent with acute presentation with potential threat to life or bodily function.  This patient presents to the ED for concern of dehydration, and  decreased urinary output, this involves an extensive number of treatment options, and is a complaint that carries with it a high risk of complications and morbidity.  The differential diagnosis includes acute renal failure, urinary obstruction, small bowel obstruction, cholecystitis, ascending cholangitis, appendicitis Co morbidities that complicate the patient evaluation  A-fib, alcohol abuse Additional history obtained:  Additional history obtained from husband at bedside  External records from outside source obtained and reviewed including recent hospital stay on 10/05/2022 Lab Tests:  I Ordered, and personally interpreted labs.  The pertinent results include: Creatinine 4.16, BUN 27, lactic acid 2.1 Imaging Studies ordered:  I ordered imaging studies including chest x-ray  I independently visualized and interpreted imaging which showed no evidence of acute abnormalities  I agree with the radiologist interpretation Cardiac Monitoring: / EKG:  The patient was maintained on a cardiac monitor.  I personally viewed and interpreted the cardiac monitored which showed an underlying rhythm of: Normal sinus rhythm Consultations Obtained:  I requested consultation with the hospitalist,  and discussed lab and imaging findings as well as pertinent plan - they recommend: Admission with fluid resuscitation Problem List / ED Course / Critical interventions / Medication management  Acute renal failure  I  ordered medication including IVF  for acute renal failure and dehydration  Reevaluation of the patient after these medicines showed that the patient improved  I have reviewed the patients home medicines and have made adjustments as needed Dispo: Admit to medicine       FINAL CLINICAL IMPRESSION(S) / ED DIAGNOSES   Final diagnoses:  Acute renal failure, unspecified acute renal failure type (HCC)  Dehydration  Alcohol abuse   Rx / DC Orders   ED Discharge Orders     None      Note:  This document was prepared using Dragon voice recognition software and may include unintentional dictation errors.   Merwyn Katos, MD 10/31/22 1536

## 2022-10-31 NOTE — H&P (Addendum)
History and Physical    Patient: Anne Wells GGY:[id number] DOB: 1950/10/23 DOA: 10/31/2022 DOS: the patient was seen and examined on 10/31/2022 PCP: Emogene Morgan, MD  Patient coming from: Home  Chief Complaint:  Chief Complaint  Patient presents with   Emesis   HPI: Anne Wells is a 72 y.o. female with medical history significant of alcohol abuse, atrial fibrillation on Eliquis and amiodarone, COPD, hypertension, OSA not on CPAP, lung cancer s/p radiation, who presents to the ED due to vomiting.  [name] states that on 7/11, 7/12, she drank half a gallon of vodka on each day.  Then on 7/13 she felt fine with no nausea, vomiting, diarrhea, dizziness, shortness of breath or chest pain.  Starting on 11/14, she noticed that she was urinating less often and then essentially stopped urinating for the last 3 days.  In addition, she has been experiencing shortness of breath and dizziness that has made it difficult for her to walk around her home.  She notes that she wears oxygen intermittently at home and has been using it.  She denies any cough.  She states shortness of breath seems much better at this time.  She notes that she is no longer on any antihypertensive therapy.  She also states that she does not drink daily, but on the days she does drink, it is always half a gallon of liquor.  Liquor is always bought in the store, she has never drank homemade  ED course: On arrival to the ED, patient was hypotensive at 81/60 with heart rate of 73.  She was saturating at 99% on room air.  She was afebrile at 98. Initial blood work demonstrates platelets of 109, sodium of 131, chloride 90, bicarb 25, BUN 27, creatinine 4.16, anion gap of 16, AST 79, ALT 52, GFR of 11 with lactic acid of 2.7.  INR 1.5.  Chest x-ray was obtained with no acute cardiopulmonary disease.  Patient started on ceftriaxone and IV fluids.  TRH contacted for admission.  Review of Systems: As mentioned in the history of  present illness. All other systems reviewed and are negative.  Past Medical History:  Diagnosis Date   Atrial fibrillation with RVR (HCC) 02/21/2021   COPD (chronic obstructive pulmonary disease) (HCC)    Past Surgical History:  Procedure Laterality Date   Dental procedure     Social History:  reports that she quit smoking about 18 years ago. Her smoking use included cigarettes. She started smoking about 53 years ago. She has a 35 pack-year smoking history. She has never used smokeless tobacco. She reports current alcohol use. She reports that she does not use drugs.  Allergies  Allergen Reactions   Shellfish Allergy Swelling    Swelling of the tongue   Penicillin G Rash    Family History  Problem Relation Age of Onset   Bone cancer Mother    COPD Father    Bone cancer Brother    COPD Brother     Prior to Admission medications   Medication Sig Start Date End Date Taking? Authorizing Provider  ACETAMINOPHEN 8 HOUR 650 MG CR tablet Take 650-1,300 mg by mouth every 8 (eight) hours as needed for pain. 03/27/21   [provider]  acyclovir (ZOVIRAX) 400 MG tablet Take 400 mg by mouth 2 (two) times daily. 03/27/21   [provider]  albuterol (PROVENTIL) (2.5 MG/3ML) 0.083% nebulizer solution Inhale 3 mLs into the lungs every 6 (six) hours as needed. 09/23/20  10/06/22  [provider]  albuterol (VENTOLIN HFA) 108 (90 Base) MCG/ACT inhaler INHALE TWO (2) PUFFS BY MOUTH EVERY 6 HOURS AS NEEDED FOR WHEEZING OR FOR SHORTNESS OF BREATH Patient taking differently: Inhale 2 puffs into the lungs every 6 (six) hours as needed for wheezing or shortness of breath. 05/31/22   Salena Saner, MD  amiodarone (PACERONE) 200 MG tablet Take 1 tablet (200 mg total) by mouth 2 (two) times daily. 09/06/22   Laurier Nancy, MD  apixaban (ELIQUIS) 5 MG TABS tablet Take 1 tablet (5 mg total) by mouth 2 (two) times daily. 09/06/22   Laurier Nancy, MD  cyclobenzaprine (FLEXERIL) 5  MG tablet Take 5 mg by mouth 2 (two) times daily. 01/05/21   [provider]  gabapentin (NEURONTIN) 600 MG tablet Take 600 mg by mouth 2 (two) times daily. 10/02/22   [provider]  Multiple Vitamins-Minerals (MULTIVITAMIN ADULT, MINERALS,) TABS Take 1 tablet by mouth daily.    [provider]  omeprazole (PRILOSEC) 20 MG capsule Take 20 mg by mouth daily. 11/15/20   [provider]  Tiotropium Bromide-Olodaterol (STIOLTO RESPIMAT) 2.5-2.5 MCG/ACT AERS Inhale 2 Inhalers into the lungs daily at 12 noon. 09/23/20   [provider]    Physical Exam: Vitals:   10/31/22 1350 10/31/22 1400 10/31/22 1430 10/31/22 1530  BP: 104/68 108/67 (!) 111/57   Pulse: 68 66 72 85  Resp: 13 19 18 19   Temp:      TempSrc:      SpO2: 100% 98% 100% 99%  Weight:      Height:       Physical Exam Vitals and nursing note reviewed.  Constitutional:      General: She is not in acute distress.    Appearance: She is normal weight.  HENT:     Head: Normocephalic and atraumatic.     Mouth/Throat:     Mouth: Mucous membranes are dry.     Pharynx: Oropharynx is clear.  Eyes:     Conjunctiva/sclera: Conjunctivae normal.     Pupils: Pupils are equal, round, and reactive to light.  Cardiovascular:     Rate and Rhythm: Normal rate and regular rhythm.     Heart sounds: No murmur heard. Pulmonary:     Effort: Pulmonary effort is normal. No tachypnea.     Breath sounds: Decreased breath sounds (Throughout) present. No wheezing, rhonchi or rales.  Abdominal:     General: Bowel sounds are normal. There is no distension.     Palpations: Abdomen is soft.     Tenderness: There is no abdominal tenderness. There is no guarding.  Musculoskeletal:     Right lower leg: No edema.     Left lower leg: No edema.  Skin:    General: Skin is warm and dry.  Neurological:     Mental Status: She is alert and oriented to person, place, and time. Mental status is at baseline.     Gait:  Gait (Ambulates independently in the room) normal.  Psychiatric:        Mood and Affect: Mood normal.        Behavior: Behavior normal.    Data Reviewed: CBC with WBC of 10.7, hemoglobin of 12.0, MCV of 96 and platelets of 109 CMP with sodium of 131, potassium 3.5, bicarb 25, glucose 112, BUN 27, creatinine 4.16 with anion gap 16, AST 79, ALT 52, total bilirubin 8.2, and GFR of 11 Lactic acid 2.7 INR 1.5 PTT  31  EKG personally reviewed.  Sinus rhythm with rate of 78.  No ST or T wave changes concerning for acute ischemia.  DG Chest Port 1 View  Result Date: 10/31/2022 CLINICAL DATA:  Oliguria, low back pain, and vomiting EXAM: PORTABLE CHEST 1 VIEW COMPARISON:  Chest radiograph dated 07/01/2022 FINDINGS: Similar right upper lobe scarring. Left retrocardiac rounded opacity. No focal consolidations. No pleural effusion or pneumothorax. Enlarged cardiomediastinal silhouette is likely projectional. No acute osseous abnormality. IMPRESSION: Left retrocardiac rounded opacity, likely hiatal hernia. Otherwise, no focal consolidations. Electronically Signed   By: Agustin Cree M.D.   On: 10/31/2022 12:44    Results are pending, will review when available.  Assessment and Plan:  * AKI (acute kidney injury) (HCC) Patient is presenting with 3-day history of anuria and elevated creatinine at 4.1 with GFR of 11.  BUN is 27 concerning for intrarenal pathology.  Given hypotension, potentially ATN.  Differential also includes hypovolemia in the setting of recent heavy alcohol use. Since receiving 2 L of IV fluids in the ED, patient has made approximately 300 cc of urine.  No evidence of retention on bladder scan.  - Repeat BMP in the a.m. - Strict in and out - Daily weights - Continue maintenance fluids - If no improvement in urine output or creatinine, consider nephrology consultation tomorrow  Hypotension Patient notes that at home, her blood pressure has been running in the 60s to 80s systolic with  associated dizziness.  She denies any fluid losses including vomiting or diarrhea but has recently used high volumes of liquor with likely associated poor p.o. intake.  She is no longer on any antihypertensives.  Low suspicion at this time for infectious etiology given lack of leukocytosis and no reported fevers.  She denies any acute localizing symptoms to suggest infection as well  - Continue IV fluids  Alcohol use disorder Patient admits to binging alcohol with half a gallon time.  She notes that she does not drink daily though.  She is committed to stopping use.  - Daily folic acid and thiamine - CIWA monitoring  Transaminitis In the setting of heavy alcohol use.  No abdominal pain reported to suggest alcohol hepatitis.  -Repeat in a.m.  Thrombocytopenia, idiopathic (HCC) Mild and stable at this time.  COPD (chronic obstructive pulmonary disease) (HCC) Patient endorses increased shortness of breath and feeling dizzy but at this time states symptoms are improving.  She denies any cough.  - DuoNebs as needed - Continue home bronchodilators  Atrial fibrillation (HCC) - Continue home amiodarone and Eliquis  Malignant neoplasm of right lung [facility]) Patient has a history of lung cancer s/p radiation.  No contribution to current presentation.  Advance Care Planning:   Code Status: Full Code verified by patient  Consults: Nephrology  Family Communication: No family at bedside  Severity of Illness: The appropriate patient status for this patient is OBSERVATION. Observation status is judged to be reasonable and necessary in order to provide the required intensity of service to ensure the patient's safety. The patient's presenting symptoms, physical exam findings, and initial radiographic and laboratory data in the context of their medical condition is felt to place them at decreased risk for further clinical deterioration. Furthermore, it is anticipated that the patient will be medically  stable for discharge from the hospital within 2 midnights of admission.   Author: Verdene Lennert, MD 10/31/2022 3:36 PM  For on call review [email or web address].

## 2022-10-31 NOTE — ED Notes (Signed)
Pt. States she has not urinated in 24 hrs. Dr. Bladder notified. Bladder scan at bedside, no urine detected.  Dr. Vicente Males notified.  Bladder scan result: 0 mL

## 2022-10-31 NOTE — Assessment & Plan Note (Signed)
Patient admits to binging alcohol with half a gallon time.  She notes that she does not drink daily though.  She is committed to stopping use.  - Daily folic acid and thiamine - CIWA monitoring

## 2022-10-31 NOTE — Assessment & Plan Note (Signed)
Mild and stable at this time.

## 2022-10-31 NOTE — Assessment & Plan Note (Signed)
Continue home amiodarone and Eliquis ?

## 2022-10-31 NOTE — Assessment & Plan Note (Signed)
Patient endorses increased shortness of breath and feeling dizzy but at this time states symptoms are improving.  She denies any cough.  - DuoNebs as needed - Continue home bronchodilators

## 2022-10-31 NOTE — Progress Notes (Signed)
CODE SEPSIS - PHARMACY COMMUNICATION  **Broad Spectrum Antibiotics should be administered within 1 hour of Sepsis diagnosis**  Time Code Sepsis Called/Page Received: 1112  Antibiotics Ordered: ceftriaxone  Time of 1st antibiotic administration: 1258  Additional action taken by pharmacy: n/a  If necessary, Name of Provider/Nurse Contacted: n/a    Elliot Gurney, PharmD, BCPS Clinical Pharmacist  10/31/2022 1:20 PM

## 2022-10-31 NOTE — Assessment & Plan Note (Addendum)
Patient notes that at home, her blood pressure has been running in the 60s to 80s systolic with associated dizziness.  She denies any fluid losses including vomiting or diarrhea but has recently used high volumes of liquor with likely associated poor p.o. intake.  She is no longer on any antihypertensives.  Low suspicion at this time for infectious etiology given lack of leukocytosis and no reported fevers.  She denies any acute localizing symptoms to suggest infection as well  - Continue IV fluids

## 2022-10-31 NOTE — Assessment & Plan Note (Signed)
In the setting of heavy alcohol use.  No abdominal pain reported to suggest alcohol hepatitis.  -Repeat in a.m.

## 2022-10-31 NOTE — Sepsis Progress Note (Signed)
Code Sepsis protocol being monitored by eLink. 

## 2022-11-01 ENCOUNTER — Other Ambulatory Visit: Payer: 59

## 2022-11-01 ENCOUNTER — Inpatient Hospital Stay: Payer: 59

## 2022-11-01 DIAGNOSIS — Z923 Personal history of irradiation: Secondary | ICD-10-CM | POA: Diagnosis not present

## 2022-11-01 DIAGNOSIS — Z91013 Allergy to seafood: Secondary | ICD-10-CM | POA: Diagnosis not present

## 2022-11-01 DIAGNOSIS — E876 Hypokalemia: Secondary | ICD-10-CM | POA: Diagnosis not present

## 2022-11-01 DIAGNOSIS — Z7901 Long term (current) use of anticoagulants: Secondary | ICD-10-CM | POA: Diagnosis not present

## 2022-11-01 DIAGNOSIS — Z880 Allergy status to penicillin: Secondary | ICD-10-CM | POA: Diagnosis not present

## 2022-11-01 DIAGNOSIS — D61818 Other pancytopenia: Secondary | ICD-10-CM | POA: Diagnosis present

## 2022-11-01 DIAGNOSIS — E872 Acidosis, unspecified: Secondary | ICD-10-CM | POA: Diagnosis not present

## 2022-11-01 DIAGNOSIS — N179 Acute kidney failure, unspecified: Secondary | ICD-10-CM | POA: Diagnosis not present

## 2022-11-01 DIAGNOSIS — Y900 Blood alcohol level of less than 20 mg/100 ml: Secondary | ICD-10-CM | POA: Diagnosis present

## 2022-11-01 DIAGNOSIS — D709 Neutropenia, unspecified: Secondary | ICD-10-CM | POA: Diagnosis not present

## 2022-11-01 DIAGNOSIS — G4733 Obstructive sleep apnea (adult) (pediatric): Secondary | ICD-10-CM | POA: Diagnosis present

## 2022-11-01 DIAGNOSIS — D6959 Other secondary thrombocytopenia: Secondary | ICD-10-CM | POA: Diagnosis present

## 2022-11-01 DIAGNOSIS — I480 Paroxysmal atrial fibrillation: Secondary | ICD-10-CM | POA: Diagnosis present

## 2022-11-01 DIAGNOSIS — Z85118 Personal history of other malignant neoplasm of bronchus and lung: Secondary | ICD-10-CM | POA: Diagnosis not present

## 2022-11-01 DIAGNOSIS — Z825 Family history of asthma and other chronic lower respiratory diseases: Secondary | ICD-10-CM | POA: Diagnosis not present

## 2022-11-01 DIAGNOSIS — K760 Fatty (change of) liver, not elsewhere classified: Secondary | ICD-10-CM | POA: Diagnosis present

## 2022-11-01 DIAGNOSIS — Z79899 Other long term (current) drug therapy: Secondary | ICD-10-CM | POA: Diagnosis not present

## 2022-11-01 DIAGNOSIS — E860 Dehydration: Secondary | ICD-10-CM | POA: Diagnosis present

## 2022-11-01 DIAGNOSIS — I10 Essential (primary) hypertension: Secondary | ICD-10-CM | POA: Diagnosis present

## 2022-11-01 DIAGNOSIS — Z87891 Personal history of nicotine dependence: Secondary | ICD-10-CM | POA: Diagnosis not present

## 2022-11-01 DIAGNOSIS — F10239 Alcohol dependence with withdrawal, unspecified: Secondary | ICD-10-CM | POA: Diagnosis present

## 2022-11-01 DIAGNOSIS — Z1152 Encounter for screening for COVID-19: Secondary | ICD-10-CM | POA: Diagnosis not present

## 2022-11-01 DIAGNOSIS — J449 Chronic obstructive pulmonary disease, unspecified: Secondary | ICD-10-CM | POA: Diagnosis present

## 2022-11-01 DIAGNOSIS — I959 Hypotension, unspecified: Secondary | ICD-10-CM | POA: Diagnosis present

## 2022-11-01 LAB — COMPREHENSIVE METABOLIC PANEL
ALT: 42 U/L (ref 0–44)
AST: 65 U/L — ABNORMAL HIGH (ref 15–41)
Albumin: 3.4 g/dL — ABNORMAL LOW (ref 3.5–5.0)
Alkaline Phosphatase: 59 U/L (ref 38–126)
Anion gap: 10 (ref 5–15)
BUN: 25 mg/dL — ABNORMAL HIGH (ref 8–23)
CO2: 27 mmol/L (ref 22–32)
Calcium: 9.1 mg/dL (ref 8.9–10.3)
Chloride: 98 mmol/L (ref 98–111)
Creatinine, Ser: 1.72 mg/dL — ABNORMAL HIGH (ref 0.44–1.00)
GFR, Estimated: 31 mL/min — ABNORMAL LOW (ref >60)
Glucose, Bld: 98 mg/dL (ref 70–99)
Potassium: 3.5 mmol/L (ref 3.5–5.1)
Sodium: 135 mmol/L (ref 135–145)
Total Bilirubin: 0.8 mg/dL (ref 0.3–1.2)
Total Protein: 6.5 g/dL (ref 6.5–8.1)

## 2022-11-01 LAB — CBC
HCT: 29.9 % — ABNORMAL LOW (ref 36.0–46.0)
Hemoglobin: 9.6 g/dL — ABNORMAL LOW (ref 12.0–15.0)
MCH: 31.1 pg (ref 26.0–34.0)
MCHC: 32.1 g/dL (ref 30.0–36.0)
MCV: 96.8 fL (ref 80.0–100.0)
Platelets: 88 10*3/uL — ABNORMAL LOW (ref 150–400)
RBC: 3.09 MIL/uL — ABNORMAL LOW (ref 3.87–5.11)
RDW: 16.6 % — ABNORMAL HIGH (ref 11.5–15.5)
WBC: 3 10*3/uL — ABNORMAL LOW (ref 4.0–10.5)
nRBC: 0 % (ref 0.0–0.2)

## 2022-11-01 LAB — PHOSPHORUS: Phosphorus: 1.2 mg/dL — ABNORMAL LOW (ref 2.5–4.6)

## 2022-11-01 LAB — MAGNESIUM: Magnesium: 1.3 mg/dL — ABNORMAL LOW (ref 1.7–2.4)

## 2022-11-01 LAB — PROTIME-INR
INR: 1.4 — ABNORMAL HIGH (ref 0.8–1.2)
Prothrombin Time: 17.1 seconds — ABNORMAL HIGH (ref 11.4–15.2)

## 2022-11-01 MED ORDER — CHLORDIAZEPOXIDE HCL 25 MG PO CAPS
25.0000 mg | ORAL_CAPSULE | Freq: Four times a day (QID) | ORAL | Status: DC
  Administered 2022-11-01 – 2022-11-03 (×9): 25 mg via ORAL
  Filled 2022-11-01 (×9): qty 1

## 2022-11-01 MED ORDER — POTASSIUM CHLORIDE CRYS ER 20 MEQ PO TBCR
40.0000 meq | EXTENDED_RELEASE_TABLET | ORAL | Status: AC
  Administered 2022-11-01 (×2): 40 meq via ORAL
  Filled 2022-11-01 (×3): qty 2

## 2022-11-01 MED ORDER — LORAZEPAM 2 MG/ML IJ SOLN
1.0000 mg | INTRAMUSCULAR | Status: DC | PRN
  Administered 2022-11-02 – 2022-11-03 (×3): 1 mg via INTRAVENOUS
  Filled 2022-11-01 (×3): qty 1

## 2022-11-01 MED ORDER — LACTATED RINGERS IV SOLN: INTRAVENOUS | Status: AC

## 2022-11-01 MED ORDER — MAGNESIUM SULFATE 2 GM/50ML IV SOLN
2.0000 g | Freq: Once | INTRAVENOUS | Status: AC
  Administered 2022-11-01: 2 g via INTRAVENOUS
  Filled 2022-11-01: qty 50

## 2022-11-01 MED ORDER — LORAZEPAM 1 MG PO TABS
1.0000 mg | ORAL_TABLET | ORAL | Status: DC | PRN
  Administered 2022-11-01: 1 mg via ORAL
  Filled 2022-11-01: qty 1

## 2022-11-01 MED ORDER — LIDOCAINE 5 % EX PTCH
1.0000 | MEDICATED_PATCH | CUTANEOUS | Status: DC
  Administered 2022-11-01 – 2022-11-02 (×2): 1 via TRANSDERMAL
  Filled 2022-11-01 (×3): qty 1

## 2022-11-01 MED ORDER — SODIUM PHOSPHATES 45 MMOLE/15ML IV SOLN
30.0000 mmol | Freq: Once | INTRAVENOUS | Status: AC
  Administered 2022-11-01: 30 mmol via INTRAVENOUS
  Filled 2022-11-01: qty 10

## 2022-11-01 NOTE — Progress Notes (Signed)
Report called to [place], RN on 1C. Patient will be moved to room 121.

## 2022-11-01 NOTE — Progress Notes (Signed)
Called and spoke with Karel Jarvis, RN and confirmed prior report. Patient will now be moved to room 110.

## 2022-11-01 NOTE — Plan of Care (Signed)

## 2022-11-01 NOTE — Progress Notes (Signed)
Report received from [place]. All questions answered.

## 2022-11-01 NOTE — Progress Notes (Addendum)
Progress Note    Anne Wells  WCB:[id number] DOB: 1950/08/22  DOA: 10/31/2022 PCP: Anne Morgan, MD      Brief Narrative:    Medical records reviewed and are as summarized below:  Anne Wells is a 72 y.o. female with medical history significant of alcohol use disorder, atrial fibrillation on Eliquis and amiodarone, COPD, hypertension, OSA not on CPAP, lung cancer s/p radiation, who presented to the hospital with vomiting, lower back pain and poor urine output.  She reported heavy alcohol use about 3 days prior to admission.  When she stopped drinking, she developed vomiting, low back pain and poor urine output. She said she does not drink daily but when she drinks she normally drinks about half a gallon of liquor.    Assessment/Plan:   Principal Problem:   AKI (acute kidney injury) (HCC) Active Problems:   Hypokalemia   Hypotension   Alcohol use disorder   Transaminitis   Thrombocytopenia, idiopathic (HCC)   COPD (chronic obstructive pulmonary disease) (HCC)   Atrial fibrillation (HCC)   Malignant neoplasm of right lung (HCC)   Hypomagnesemia   Hypophosphatemia    Body mass index is 29.8 kg/m.   AKI in the setting of recent heavy alcohol use: Improving.  Continue IV fluids and repeat BMP tomorrow.   Hypotension: Improving.  Continue IV fluids. She says she is no longer on any antihypertensives at home.   Lactic acidosis: This is likely from AKI.  No evidence of sepsis at this time.  Discontinue IV ceftriaxone.   Hypokalemia: Improved.  Continue potassium repletion Hypomagnesemia: Magnesium 1.3.  Replete with IV magnesium sulfate. Hypophosphatemia: Phosphorus 1.2.  Replete with IV sodium phosphate.  Monitor electrolytes.   Paroxysmal atrial fibrillation: Continue amiodarone and Eliquis.   Alcohol use disorder with high risk of alcohol withdrawal syndrome: Start chlordiazepoxide.  CIWA protocol has been ordered as well. She has been advised to  stop drinking alcohol.  She said she is trying to quit.   Chronic thrombocytopenia and elevated liver enzymes: These are likely due to alcohol use disorder. Chart review showed MRI abdomen in March 2023 showed hepatic steatosis.  Outpatient follow-up recommended.   Severe COPD: Stable.  Continue bronchodilators as needed.   History of right lung cancer: S/p radiation therapy completed in 2023    Diet Order             Diet regular Room service appropriate? Yes; Fluid consistency: Thin  Diet effective now                            Consultants: None  Procedures: None    Medications:    amiodarone  200 mg Oral BID   apixaban  5 mg Oral BID   arformoterol  15 mcg Nebulization BID   And   umeclidinium bromide  1 puff Inhalation Daily   chlordiazePOXIDE  25 mg Oral QID   folic acid  1 mg Oral Daily   multivitamin with minerals  1 tablet Oral Daily   potassium chloride  40 mEq Oral Q4H   sodium chloride flush  3 mL Intravenous Q12H   thiamine  100 mg Oral Daily   Continuous Infusions:  lactated ringers 100 mL/hr at 11/01/22 1300   magnesium sulfate bolus IVPB     sodium phosphate 30 mmol in dextrose 5 % 250 mL infusion       Anti-infectives (From admission, onward)  Start     Dose/Rate Route Frequency Ordered Stop   10/31/22 1200  cefTRIAXone (ROCEPHIN) 2 g in sodium chloride 0.9 % 100 mL IVPB  Status:  Discontinued        2 g 200 mL/hr over 30 Minutes Intravenous Every 24 hours 10/31/22 1155 11/01/22 1149              Family Communication/Anticipated D/C date and plan/Code Status   DVT prophylaxis:  apixaban (ELIQUIS) tablet 5 mg     Code Status: Full Code  Family Communication: None Disposition Plan: Plan to discharge home in 1 to 2 days   Status is: Observation The patient will require care spanning > 2 midnights and should be moved to inpatient because: Needs IV fluids for AKI      Subjective:   Interval events  noted.  Urine output has improved.  Low back pain is better.  No vomiting or diarrhea.  She said she gets the shakes when she does not drink alcohol and sometimes has to take an eye-opener in the morning to prevent him from going into withdrawal.  Objective:    Vitals:   11/01/22 0457 11/01/22 0752 11/01/22 0907 11/01/22 1234  BP: 111/71  104/86 (!) 99/54  Pulse: 79  76 83  Resp:   18 18  Temp:   98 F (36.7 C) 98.5 F (36.9 C)  TempSrc:   Oral Oral  SpO2:  97% 100% 98%  Weight:      Height:       No data found.   Intake/Output Summary (Last 24 hours) at 11/01/2022 1612 Last data filed at 11/01/2022 1300 Gross per 24 hour  Intake 3025.46 ml  Output 400 ml  Net 2625.46 ml   Filed Weights   10/31/22 1137  Weight: 88.9 kg    Exam:  GEN: NAD SKIN: No rash EYES: EOMI ENT: MMM CV: RRR PULM: CTA B ABD: soft, ND, NT, +BS CNS: AAO x 3, non focal EXT: No edema or tenderness        Data Reviewed:   I have personally reviewed following labs and imaging studies:  Labs: Labs show the following:   Basic Metabolic Panel: Recent Labs  Lab 10/31/22 1138 10/31/22 2042 11/01/22 0626  NA 131* 132* 135  K 3.5 3.2* 3.5  CL 90* 96* 98  CO2 25 24 27   GLUCOSE 112* 132* 98  BUN 27* 26* 25*  CREATININE 4.16* 2.58* 1.72*  CALCIUM 9.9 9.0 9.1  MG  --   --  1.3*  PHOS  --   --  1.2*   GFR Estimated Creatinine Clearance: 34.5 mL/min (A) (by C-G formula based on SCr of 1.72 mg/dL (H)). Liver Function Tests: Recent Labs  Lab 10/31/22 1138 11/01/22 0626  AST 79* 65*  ALT 52* 42  ALKPHOS 77 59  BILITOT 0.9 0.8  PROT 8.2* 6.5  ALBUMIN 4.4 3.4*   No results for input(s): "LIPASE", "AMYLASE" in the last 168 hours. No results for input(s): "AMMONIA" in the last 168 hours. Coagulation profile Recent Labs  Lab 10/31/22 1138 11/01/22 0626  INR 1.5* 1.4*    CBC: Recent Labs  Lab 10/31/22 1138 11/01/22 0626  WBC 4.7 3.0*  NEUTROABS 3.5  --   HGB 12.0 9.6*   HCT 38.0 29.9*  MCV 96.7 96.8  PLT 109* 88*   Cardiac Enzymes: No results for input(s): "CKTOTAL", "CKMB", "CKMBINDEX", "TROPONINI" in the last 168 hours. BNP (last 3 results) No results for input(s): "PROBNP"  in the last 8760 hours. CBG: No results for input(s): "GLUCAP" in the last 168 hours. D-Dimer: No results for input(s): "DDIMER" in the last 72 hours. Hgb A1c: No results for input(s): "HGBA1C" in the last 72 hours. Lipid Profile: No results for input(s): "CHOL", "HDL", "LDLCALC", "TRIG", "CHOLHDL", "LDLDIRECT" in the last 72 hours. Thyroid function studies: No results for input(s): "TSH", "T4TOTAL", "T3FREE", "THYROIDAB" in the last 72 hours.  Invalid input(s): "FREET3" Anemia work up: No results for input(s): "VITAMINB12", "FOLATE", "FERRITIN", "TIBC", "IRON", "RETICCTPCT" in the last 72 hours. Sepsis Labs: Recent Labs  Lab 10/31/22 1138 10/31/22 1214 11/01/22 0626  WBC 4.7  --  3.0*  LATICACIDVEN 2.7* 2.0*  --     Microbiology Recent Results (from the past 240 hour(s))  Resp panel by RT-PCR (RSV, Flu A&B, Covid) Anterior Nasal Swab     Status: None   Collection Time: 10/31/22 12:14 PM   Specimen: Anterior Nasal Swab  Result Value Ref Range Status   SARS Coronavirus 2 by RT PCR NEGATIVE NEGATIVE Final    Comment: (NOTE) SARS-CoV-2 target nucleic acids are NOT DETECTED.  The SARS-CoV-2 RNA is generally detectable in upper respiratory specimens during the acute phase of infection. The lowest concentration of SARS-CoV-2 viral copies this assay can detect is 138 copies/mL. A negative result does not preclude SARS-Cov-2 infection and should not be used as the sole basis for treatment or other patient management decisions. A negative result may occur with  improper specimen collection/handling, submission of specimen other than nasopharyngeal swab, presence of viral mutation(s) within the areas targeted by this assay, and inadequate number of viral copies(<138  copies/mL). A negative result must be combined with clinical observations, patient history, and epidemiological information. The expected result is Negative.  Fact Sheet for Patients:  BloggerCourse.com  Fact Sheet for Healthcare Providers:  SeriousBroker.it  This test is no t yet approved or cleared by the [place] FDA and  has been authorized for detection and/or diagnosis of SARS-CoV-2 by FDA under an Emergency Use Authorization (EUA). This EUA will remain  in effect (meaning this test can be used) for the duration of the COVID-19 declaration under Section 564(b)(1) of the Act, 21 U.S.C.section 360bbb-3(b)(1), unless the authorization is terminated  or revoked sooner.       Influenza A by PCR NEGATIVE NEGATIVE Final   Influenza B by PCR NEGATIVE NEGATIVE Final    Comment: (NOTE) The Xpert Xpress SARS-CoV-2/FLU/RSV plus assay is intended as an aid in the diagnosis of influenza from Nasopharyngeal swab specimens and should not be used as a sole basis for treatment. Nasal washings and aspirates are unacceptable for Xpert Xpress SARS-CoV-2/FLU/RSV testing.  Fact Sheet for Patients: BloggerCourse.com  Fact Sheet for Healthcare Providers: SeriousBroker.it  This test is not yet approved or cleared by the [place] FDA and has been authorized for detection and/or diagnosis of SARS-CoV-2 by FDA under an Emergency Use Authorization (EUA). This EUA will remain in effect (meaning this test can be used) for the duration of the COVID-19 declaration under Section 564(b)(1) of the Act, 21 U.S.C. section 360bbb-3(b)(1), unless the authorization is terminated or revoked.     Resp Syncytial Virus by PCR NEGATIVE NEGATIVE Final    Comment: (NOTE) Fact Sheet for Patients: BloggerCourse.com  Fact Sheet for Healthcare  Providers: SeriousBroker.it  This test is not yet approved or cleared by the [place] FDA and has been authorized for detection and/or diagnosis of SARS-CoV-2 by FDA under an Emergency Use Authorization (EUA).  This EUA will remain in effect (meaning this test can be used) for the duration of the COVID-19 declaration under Section 564(b)(1) of the Act, 21 U.S.C. section 360bbb-3(b)(1), unless the authorization is terminated or revoked.  Performed at [facility], [address] [place], [place] [address]   Blood Culture (routine x 2)     Status: None (Preliminary result)   Collection Time: 10/31/22 12:14 PM   Specimen: BLOOD  Result Value Ref Range Status   Specimen Description BLOOD RIGTH WRIST  Final   Special Requests   Final    BOTTLES DRAWN AEROBIC AND ANAEROBIC Blood Culture results may not be optimal due to an inadequate volume of blood received in culture bottles   Culture   Final    NO GROWTH < 24 HOURS Performed at [facility], [address], [place], [place] [address]    Report Status PENDING  Incomplete  Blood Culture (routine x 2)     Status: None (Preliminary result)   Collection Time: 10/31/22  2:08 PM   Specimen: BLOOD  Result Value Ref Range Status   Specimen Description BLOOD BLOOD RIGHT FOREARM  Final   Special Requests   Final    BOTTLES DRAWN AEROBIC AND ANAEROBIC Blood Culture adequate volume   Culture   Final    NO GROWTH < 24 HOURS Performed at [facility], [address], [place], [place] [address]    Report Status PENDING  Incomplete    Procedures and diagnostic studies:  DG Chest Port 1 View  Result Date: 10/31/2022 CLINICAL DATA:  Oliguria, low back pain, and vomiting EXAM: PORTABLE CHEST 1 VIEW COMPARISON:  Chest radiograph dated 07/01/2022 FINDINGS: Similar right upper lobe scarring. Left retrocardiac rounded opacity. No focal consolidations. No pleural effusion or pneumothorax.  Enlarged cardiomediastinal silhouette is likely projectional. No acute osseous abnormality. IMPRESSION: Left retrocardiac rounded opacity, likely hiatal hernia. Otherwise, no focal consolidations. Electronically Signed   By: Agustin Cree M.D.   On: 10/31/2022 12:44               LOS: 0 days   Cassidi Modesitt  [facility]   Pager on [email or web address]. If 7PM-7AM, please contact night-coverage at [email or web address]     11/01/2022, 4:12 PM

## 2022-11-02 DIAGNOSIS — D709 Neutropenia, unspecified: Secondary | ICD-10-CM | POA: Insufficient documentation

## 2022-11-02 DIAGNOSIS — N179 Acute kidney failure, unspecified: Secondary | ICD-10-CM | POA: Diagnosis not present

## 2022-11-02 LAB — CBC WITH DIFFERENTIAL/PLATELET
Abs Immature Granulocytes: 0.01 10*3/uL (ref 0.00–0.07)
Basophils Absolute: 0 10*3/uL (ref 0.0–0.1)
Basophils Relative: 1 %
Eosinophils Absolute: 0.1 10*3/uL (ref 0.0–0.5)
Eosinophils Relative: 5 %
HCT: 28.7 % — ABNORMAL LOW (ref 36.0–46.0)
Hemoglobin: 9.1 g/dL — ABNORMAL LOW (ref 12.0–15.0)
Immature Granulocytes: 1 %
Lymphocytes Relative: 42 %
Lymphs Abs: 0.8 10*3/uL (ref 0.7–4.0)
MCH: 30.7 pg (ref 26.0–34.0)
MCHC: 31.7 g/dL (ref 30.0–36.0)
MCV: 97 fL (ref 80.0–100.0)
Monocytes Absolute: 0.2 10*3/uL (ref 0.1–1.0)
Monocytes Relative: 9 %
Neutro Abs: 0.8 10*3/uL — ABNORMAL LOW (ref 1.7–7.7)
Neutrophils Relative %: 42 %
Platelets: 81 10*3/uL — ABNORMAL LOW (ref 150–400)
RBC: 2.96 MIL/uL — ABNORMAL LOW (ref 3.87–5.11)
RDW: 16.7 % — ABNORMAL HIGH (ref 11.5–15.5)
Smear Review: NORMAL
WBC: 1.9 10*3/uL — ABNORMAL LOW (ref 4.0–10.5)
nRBC: 0 % (ref 0.0–0.2)

## 2022-11-02 LAB — BASIC METABOLIC PANEL
Anion gap: 8 (ref 5–15)
BUN: 19 mg/dL (ref 8–23)
CO2: 27 mmol/L (ref 22–32)
Calcium: 8.6 mg/dL — ABNORMAL LOW (ref 8.9–10.3)
Chloride: 103 mmol/L (ref 98–111)
Creatinine, Ser: 0.96 mg/dL (ref 0.44–1.00)
GFR, Estimated: >60 mL/min (ref >60)
Glucose, Bld: 85 mg/dL (ref 70–99)
Potassium: 3.9 mmol/L (ref 3.5–5.1)
Sodium: 138 mmol/L (ref 135–145)

## 2022-11-02 LAB — CULTURE, BLOOD (ROUTINE X 2): Culture: NO GROWTH

## 2022-11-02 LAB — LACTIC ACID, PLASMA: Lactic Acid, Venous: 0.8 mmol/L (ref 0.5–1.9)

## 2022-11-02 LAB — PHOSPHORUS: Phosphorus: 3.6 mg/dL (ref 2.5–4.6)

## 2022-11-02 LAB — MAGNESIUM: Magnesium: 1.5 mg/dL — ABNORMAL LOW (ref 1.7–2.4)

## 2022-11-02 LAB — CORTISOL: Cortisol, Plasma: 13.3 ug/dL

## 2022-11-02 MED ORDER — MAGNESIUM SULFATE 2 GM/50ML IV SOLN
2.0000 g | Freq: Once | INTRAVENOUS | Status: AC
  Administered 2022-11-02: 2 g via INTRAVENOUS

## 2022-11-02 MED ORDER — MAGNESIUM SULFATE 2 GM/50ML IV SOLN
INTRAVENOUS | Status: AC
  Filled 2022-11-02: qty 50

## 2022-11-02 NOTE — TOC Initial Note (Signed)
Transition of Care [facility]) - Initial/Assessment Note    Patient Details  Name: Anne Wells MRN: [id number] Date of Birth: March 14, 1951  Transition of [facility]) CM/SW Contact:    Liliana Cline, LCSW Phone Number: 11/02/2022, 4:04 PM  Clinical Narrative:                 Spoke to patient for high readmission risk assessment and to offer SA resources per [facility] consult.  Patient lives with her spouse. Patient can drive herself to appointments, husband is also available if needed. PCP is Dr. Letta Pate. Patient uses a Energy manager. Her local pharmacy of choice would be [facility] on [address].  Patient has oxygen and a nebulizer at home through [employer].  Patient is agreeable to SA resources for alcohol use. SA resources added to AVS.    Expected Discharge Plan: Home/Self Care Barriers to Discharge: Continued Medical Work up   Patient Goals and CMS Choice Patient states their goals for this hospitalization and ongoing recovery are:: home with husband CMS Medicare.gov Compare Post Acute Care list provided to:: Patient Choice offered to / list presented to : Patient      Expected Discharge Plan and Services       Living arrangements for the past 2 months: Single Family Home                                      Prior Living Arrangements/Services Living arrangements for the past 2 months: Single Family Home Lives with:: Spouse Patient language and need for interpreter reviewed:: Yes Do you feel safe going back to the place where you live?: Yes      Need for Family Participation in Patient Care: Yes (Comment) Care giver support system in place?: Yes (comment) Current home services: DME Criminal Activity/Legal Involvement Pertinent to Current Situation/Hospitalization: No - Comment as needed  Activities of Daily Living Home Assistive Devices/Equipment: None ADL Screening (condition at time of admission) Patient's cognitive ability adequate to safely complete daily  activities?: Yes Is the patient deaf or have difficulty hearing?: No Does the patient have difficulty seeing, even when wearing glasses/contacts?: No Does the patient have difficulty concentrating, remembering, or making decisions?: No Patient able to express need for assistance with ADLs?: Yes Does the patient have difficulty dressing or bathing?: No Independently performs ADLs?: Yes (appropriate for developmental age) Does the patient have difficulty walking or climbing stairs?: No Weakness of Legs: None Weakness of Arms/Hands: None  Permission Sought/Granted Permission sought to share information with : Facility Medical sales representative, Family Supports Permission granted to share information with : Yes, Verbal Permission Granted     Permission granted to share info w AGENCY: as needed  Permission granted to share info w Relationship: husband     Emotional Assessment       Orientation: : Oriented to Self, Oriented to Situation, Oriented to Place, Oriented to  Time Alcohol / Substance Use: Alcohol Use Psych Involvement: No (comment)  Admission diagnosis:  Dehydration [E86.0] Alcohol abuse [F10.10] AKI (acute kidney injury) (HCC) [N17.9] Acute renal failure, unspecified acute renal failure type [facility]) [N17.9] Patient Active Problem List   Diagnosis Date Noted   Neutropenia (HCC) 11/02/2022   Hypophosphatemia 11/01/2022   Hypotension 10/31/2022   AKI (acute kidney injury) (HCC) 10/05/2022   Acute gastritis 07/30/2021   Chronic anticoagulation    Hyponatremia    Acute gastroenteritis    Elevated  lipase    Alcohol use disorder    Malignant neoplasm of right lung (HCC) 07/17/2021   COPD (chronic obstructive pulmonary disease) (HCC) 02/21/2021   Chest pain 02/21/2021   Hypokalemia 02/21/2021   Hypomagnesemia 02/21/2021   Atrial fibrillation (HCC) 02/21/2021   GERD (gastroesophageal reflux disease) 02/21/2021   Fall at home, initial encounter 02/21/2021   Nodule of upper  lobe of right lung 02/21/2021   Transaminitis 03/14/2020   Pulmonary nodules 03/14/2020   Thrombocytopenia, idiopathic (HCC) 03/29/2018   Essential hypertension 12/27/2017   OSA (obstructive sleep apnea) 12/25/2016   Overweight (BMI 25.0-29.9) 03/24/2013   Iron deficiency anemia 09/20/2011   COPD, severe (HCC) 01/29/2011   PCP:  Emogene Morgan, MD Pharmacy:   [facility], [place] - [address] [place] [address] Phone: [phone] Fax: [phone]  [facility] [address], [place] - [address] [place] [address] Phone: [phone] Fax: [phone]     Social Determinants of Health (SDOH) Social History: SDOH Screenings   Food Insecurity: No Food Insecurity (11/01/2022)  Housing: Low Risk  (11/01/2022)  Transportation Needs: No Transportation Needs (11/01/2022)  Utilities: Not At Risk (11/01/2022)  Financial Resource Strain: Low Risk  (06/18/2017)   [facility] from [facility], [facility]  Tobacco Use: Medium Risk (10/31/2022)   SDOH Interventions:     Readmission Risk Interventions    11/02/2022    4:03 PM  Readmission Risk Prevention Plan  Transportation Screening Complete  PCP or Specialist Appt within 3-5 Days Complete  HRI or Home Care Consult Complete  Social Work Consult for Recovery Care Planning/Counseling Complete  Palliative Care Screening Not Applicable  Medication Review Oceanographer) Complete

## 2022-11-02 NOTE — Progress Notes (Addendum)
Progress Note    Anne Wells  EXB:[id number] DOB: [birth year]  DOA: 10/31/2022 PCP: Emogene Morgan, MD      Brief Narrative:    Medical records reviewed and are as summarized below:  Anne Wells is a 72 y.o. female with medical history significant of alcohol use disorder, atrial fibrillation on Eliquis and amiodarone, COPD, hypertension, OSA not on CPAP, lung cancer s/p radiation, who presented to the hospital with vomiting, lower back pain and poor urine output.  She reported heavy alcohol use about 3 days prior to admission.  When she stopped drinking, she developed vomiting, low back pain and poor urine output. She said she does not drink daily but when she drinks she normally drinks about half a gallon of liquor.    Assessment/Plan:   Principal Problem:   AKI (acute kidney injury) (HCC) Active Problems:   Hypokalemia   Hypotension   Alcohol use disorder   Transaminitis   Thrombocytopenia, idiopathic (HCC)   COPD (chronic obstructive pulmonary disease) (HCC)   Atrial fibrillation (HCC)   Malignant neoplasm of right lung (HCC)   Hypomagnesemia   Hypophosphatemia   Neutropenia (HCC)    Body mass index is 30.94 kg/m.   AKI in the setting of recent heavy alcohol use: Improved.     Hypotension: Improved She says she is no longer on any antihypertensives at home.   Lactic acidosis: Improved   Neutropenia/leukopenia: wbc is down to 1.9 and neutrophil count is 0.8. Repeat CBC tomorrow   Hypokalemia: Improved.  Hypomagnesemia: Magnesium level from 1.3-1.5.  Replete with IV magnesium sulfate. Hypophosphatemia: Improved.  Repeat electrolytes tomorrow   Paroxysmal atrial fibrillation: Continue amiodarone and Eliquis.   Alcohol use disorder with high risk of alcohol withdrawal syndrome: Continue chlordiazepoxide. IV ativan as needed per CIWA protocol. She has been advised to stop drinking alcohol.  She said she is trying to quit.   Low back pain:  Improved. Xray lumbar spine was unremarkable   Chronic thrombocytopenia and elevated liver enzymes: These are likely due to alcohol use disorder. Chart review showed MRI abdomen in March 2023 showed hepatic steatosis.  Outpatient follow-up recommended.   Severe COPD: Stable.  Continue bronchodilators as needed.   History of right lung cancer: S/p radiation therapy completed in 2023    Diet Order             Diet regular Room service appropriate? Yes; Fluid consistency: Thin  Diet effective now                            Consultants: None  Procedures: None    Medications:    amiodarone  200 mg Oral BID   apixaban  5 mg Oral BID   arformoterol  15 mcg Nebulization BID   And   umeclidinium bromide  1 puff Inhalation Daily   chlordiazePOXIDE  25 mg Oral QID   folic acid  1 mg Oral Daily   lidocaine  1 patch Transdermal Q24H   multivitamin with minerals  1 tablet Oral Daily   sodium chloride flush  3 mL Intravenous Q12H   thiamine  100 mg Oral Daily   Continuous Infusions:  magnesium sulfate bolus IVPB        Anti-infectives (From admission, onward)    Start     Dose/Rate Route Frequency Ordered Stop   10/31/22 1200  cefTRIAXone (ROCEPHIN) 2 g in sodium chloride 0.9 % 100 mL  IVPB  Status:  Discontinued        2 g 200 mL/hr over 30 Minutes Intravenous Every 24 hours 10/31/22 1155 11/01/22 1149              Family Communication/Anticipated D/C date and plan/Code Status   DVT prophylaxis:  apixaban (ELIQUIS) tablet 5 mg     Code Status: Full Code  Family Communication: None Disposition Plan: Plan to discharge home tomorrow   Status is: Inpatient Remains inpatient appropriate because: neutropenia        Subjective:   Interval events noted. She c/o shaking of her hands. Back pain is better.   Objective:    Vitals:   11/01/22 2257 11/01/22 2257 11/02/22 0010 11/02/22 0900  BP:  113/75 (!) 107/55 (!) 123/57  Pulse:  79  81 79  Resp:    16  Temp:    98 F (36.7 C)  TempSrc:    Oral  SpO2:  99% 98% 98%  Weight: 92.3 kg     Height:       No data found.   Intake/Output Summary (Last 24 hours) at 11/02/2022 1430 Last data filed at 11/02/2022 0456 Gross per 24 hour  Intake 943.41 ml  Output --  Net 943.41 ml   Filed Weights   10/31/22 1137 11/01/22 2257  Weight: 88.9 kg 92.3 kg    Exam:  GEN: NAD SKIN: No rash EYES: EOMI ENT: MMM CV: RRR PULM: CTA B ABD: soft, ND, NT, +BS CNS: AAO x 3, non focal, fine tremors of b/l hands EXT: No edema or tenderness       Data Reviewed:   I have personally reviewed following labs and imaging studies:  Labs: Labs show the following:   Basic Metabolic Panel: Recent Labs  Lab 10/31/22 1138 10/31/22 2042 11/01/22 0626 11/02/22 0500 11/02/22 0605  NA 131* 132* 135  --  138  K 3.5 3.2* 3.5  --  3.9  CL 90* 96* 98  --  103  CO2 25 24 27   --  27  GLUCOSE 112* 132* 98  --  85  BUN 27* 26* 25*  --  19  CREATININE 4.16* 2.58* 1.72*  --  0.96  CALCIUM 9.9 9.0 9.1  --  8.6*  MG  --   --  1.3* 1.5*  --   PHOS  --   --  1.2* 3.6  --    GFR Estimated Creatinine Clearance: 63 mL/min (by C-G formula based on SCr of 0.96 mg/dL). Liver Function Tests: Recent Labs  Lab 10/31/22 1138 11/01/22 0626  AST 79* 65*  ALT 52* 42  ALKPHOS 77 59  BILITOT 0.9 0.8  PROT 8.2* 6.5  ALBUMIN 4.4 3.4*   No results for input(s): "LIPASE", "AMYLASE" in the last 168 hours. No results for input(s): "AMMONIA" in the last 168 hours. Coagulation profile Recent Labs  Lab 10/31/22 1138 11/01/22 0626  INR 1.5* 1.4*    CBC: Recent Labs  Lab 10/31/22 1138 11/01/22 0626 11/02/22 0605  WBC 4.7 3.0* 1.9*  NEUTROABS 3.5  --  0.8*  HGB 12.0 9.6* 9.1*  HCT 38.0 29.9* 28.7*  MCV 96.7 96.8 97.0  PLT 109* 88* 81*   Cardiac Enzymes: No results for input(s): "CKTOTAL", "CKMB", "CKMBINDEX", "TROPONINI" in the last 168 hours. BNP (last 3 results) No results for  input(s): "PROBNP" in the last 8760 hours. CBG: No results for input(s): "GLUCAP" in the last 168 hours. D-Dimer: No results for input(s): "DDIMER" in  the last 72 hours. Hgb A1c: No results for input(s): "HGBA1C" in the last 72 hours. Lipid Profile: No results for input(s): "CHOL", "HDL", "LDLCALC", "TRIG", "CHOLHDL", "LDLDIRECT" in the last 72 hours. Thyroid function studies: No results for input(s): "TSH", "T4TOTAL", "T3FREE", "THYROIDAB" in the last 72 hours.  Invalid input(s): "FREET3" Anemia work up: No results for input(s): "VITAMINB12", "FOLATE", "FERRITIN", "TIBC", "IRON", "RETICCTPCT" in the last 72 hours. Sepsis Labs: Recent Labs  Lab 10/31/22 1138 10/31/22 1214 11/01/22 0626 11/02/22 0605 11/02/22 0728  WBC 4.7  --  3.0* 1.9*  --   LATICACIDVEN 2.7* 2.0*  --   --  0.8    Microbiology Recent Results (from the past 240 hour(s))  Resp panel by RT-PCR (RSV, Flu A&B, Covid) Anterior Nasal Swab     Status: None   Collection Time: 10/31/22 12:14 PM   Specimen: Anterior Nasal Swab  Result Value Ref Range Status   SARS Coronavirus 2 by RT PCR NEGATIVE NEGATIVE Final    Comment: (NOTE) SARS-CoV-2 target nucleic acids are NOT DETECTED.  The SARS-CoV-2 RNA is generally detectable in upper respiratory specimens during the acute phase of infection. The lowest concentration of SARS-CoV-2 viral copies this assay can detect is 138 copies/mL. A negative result does not preclude SARS-Cov-2 infection and should not be used as the sole basis for treatment or other patient management decisions. A negative result may occur with  improper specimen collection/handling, submission of specimen other than nasopharyngeal swab, presence of viral mutation(s) within the areas targeted by this assay, and inadequate number of viral copies(<138 copies/mL). A negative result must be combined with clinical observations, patient history, and epidemiological information. The expected result is  Negative.  Fact Sheet for Patients:  BloggerCourse.com  Fact Sheet for Healthcare Providers:  SeriousBroker.it  This test is no t yet approved or cleared by the [place] FDA and  has been authorized for detection and/or diagnosis of SARS-CoV-2 by FDA under an Emergency Use Authorization (EUA). This EUA will remain  in effect (meaning this test can be used) for the duration of the COVID-19 declaration under Section 564(b)(1) of the Act, 21 U.S.C.section 360bbb-3(b)(1), unless the authorization is terminated  or revoked sooner.       Influenza A by PCR NEGATIVE NEGATIVE Final   Influenza B by PCR NEGATIVE NEGATIVE Final    Comment: (NOTE) The Xpert Xpress SARS-CoV-2/FLU/RSV plus assay is intended as an aid in the diagnosis of influenza from Nasopharyngeal swab specimens and should not be used as a sole basis for treatment. Nasal washings and aspirates are unacceptable for Xpert Xpress SARS-CoV-2/FLU/RSV testing.  Fact Sheet for Patients: BloggerCourse.com  Fact Sheet for Healthcare Providers: SeriousBroker.it  This test is not yet approved or cleared by the [place] FDA and has been authorized for detection and/or diagnosis of SARS-CoV-2 by FDA under an Emergency Use Authorization (EUA). This EUA will remain in effect (meaning this test can be used) for the duration of the COVID-19 declaration under Section 564(b)(1) of the Act, 21 U.S.C. section 360bbb-3(b)(1), unless the authorization is terminated or revoked.     Resp Syncytial Virus by PCR NEGATIVE NEGATIVE Final    Comment: (NOTE) Fact Sheet for Patients: BloggerCourse.com  Fact Sheet for Healthcare Providers: SeriousBroker.it  This test is not yet approved or cleared by the [place] FDA and has been authorized for detection and/or diagnosis of  SARS-CoV-2 by FDA under an Emergency Use Authorization (EUA). This EUA will remain in effect (meaning this test can be  used) for the duration of the COVID-19 declaration under Section 564(b)(1) of the Act, 21 U.S.C. section 360bbb-3(b)(1), unless the authorization is terminated or revoked.  Performed at [facility], [address] [place], [place] [address]   Blood Culture (routine x 2)     Status: None (Preliminary result)   Collection Time: 10/31/22 12:14 PM   Specimen: BLOOD  Result Value Ref Range Status   Specimen Description BLOOD RIGTH WRIST  Final   Special Requests   Final    BOTTLES DRAWN AEROBIC AND ANAEROBIC Blood Culture results may not be optimal due to an inadequate volume of blood received in culture bottles   Culture   Final    NO GROWTH 2 DAYS Performed at [facility], [address], [place], [place] [address]    Report Status PENDING  Incomplete  Blood Culture (routine x 2)     Status: None (Preliminary result)   Collection Time: 10/31/22  2:08 PM   Specimen: BLOOD  Result Value Ref Range Status   Specimen Description BLOOD BLOOD RIGHT FOREARM  Final   Special Requests   Final    BOTTLES DRAWN AEROBIC AND ANAEROBIC Blood Culture adequate volume   Culture   Final    NO GROWTH 2 DAYS Performed at [facility], [address] [place], [place] [address]    Report Status PENDING  Incomplete    Procedures and diagnostic studies:  DG Lumbar Spine 2-3 Views  Result Date: 11/01/2022 CLINICAL DATA:  Low back pain EXAM: LUMBAR SPINE - 2-3 VIEW COMPARISON:  CT 10/05/2022 FINDINGS: No acute fracture evidence of traumatic listhesis. Intervertebral disc space height is maintained. Mild lower lumbar facet arthropathy. IMPRESSION: No acute fracture or traumatic listhesis. Electronically Signed   By: Minerva Fester M.D.   On: 11/01/2022 18:22               LOS: 1 day   Rowland Ericsson  [facility]   Pager on  [email or web address]. If 7PM-7AM, please contact night-coverage at [email or web address]     11/02/2022, 2:30 PM

## 2022-11-02 NOTE — Plan of Care (Signed)

## 2022-11-03 DIAGNOSIS — D709 Neutropenia, unspecified: Secondary | ICD-10-CM

## 2022-11-03 DIAGNOSIS — N179 Acute kidney failure, unspecified: Secondary | ICD-10-CM | POA: Diagnosis not present

## 2022-11-03 LAB — CULTURE, BLOOD (ROUTINE X 2)

## 2022-11-03 LAB — BASIC METABOLIC PANEL
Anion gap: 7 (ref 5–15)
BUN: 19 mg/dL (ref 8–23)
CO2: 27 mmol/L (ref 22–32)
Calcium: 8.9 mg/dL (ref 8.9–10.3)
Chloride: 102 mmol/L (ref 98–111)
Creatinine, Ser: 0.88 mg/dL (ref 0.44–1.00)
GFR, Estimated: >60 mL/min (ref >60)
Glucose, Bld: 89 mg/dL (ref 70–99)
Potassium: 3.9 mmol/L (ref 3.5–5.1)
Sodium: 136 mmol/L (ref 135–145)

## 2022-11-03 LAB — CBC WITH DIFFERENTIAL/PLATELET
Abs Immature Granulocytes: 0.01 10*3/uL (ref 0.00–0.07)
Basophils Absolute: 0 10*3/uL (ref 0.0–0.1)
Basophils Relative: 1 %
Eosinophils Absolute: 0.1 10*3/uL (ref 0.0–0.5)
Eosinophils Relative: 5 %
HCT: 29.1 % — ABNORMAL LOW (ref 36.0–46.0)
Hemoglobin: 9 g/dL — ABNORMAL LOW (ref 12.0–15.0)
Immature Granulocytes: 1 %
Lymphocytes Relative: 32 %
Lymphs Abs: 0.7 10*3/uL (ref 0.7–4.0)
MCH: 30.1 pg (ref 26.0–34.0)
MCHC: 30.9 g/dL (ref 30.0–36.0)
MCV: 97.3 fL (ref 80.0–100.0)
Monocytes Absolute: 0.3 10*3/uL (ref 0.1–1.0)
Monocytes Relative: 12 %
Neutro Abs: 1.1 10*3/uL — ABNORMAL LOW (ref 1.7–7.7)
Neutrophils Relative %: 49 %
Platelets: 99 10*3/uL — ABNORMAL LOW (ref 150–400)
RBC: 2.99 MIL/uL — ABNORMAL LOW (ref 3.87–5.11)
RDW: 16.7 % — ABNORMAL HIGH (ref 11.5–15.5)
WBC: 2.2 10*3/uL — ABNORMAL LOW (ref 4.0–10.5)
nRBC: 0 % (ref 0.0–0.2)

## 2022-11-03 LAB — MAGNESIUM: Magnesium: 1.6 mg/dL — ABNORMAL LOW (ref 1.7–2.4)

## 2022-11-03 LAB — PHOSPHORUS: Phosphorus: 4 mg/dL (ref 2.5–4.6)

## 2022-11-03 MED ORDER — MAGNESIUM OXIDE 400 MG PO TABS: 400.0000 mg | ORAL_TABLET | Freq: Every day | ORAL | 0 refills | Status: AC

## 2022-11-03 MED ORDER — MAGNESIUM SULFATE 2 GM/50ML IV SOLN
2.0000 g | Freq: Once | INTRAVENOUS | Status: AC
  Administered 2022-11-03: 2 g via INTRAVENOUS
  Filled 2022-11-03: qty 50

## 2022-11-03 MED ORDER — DIAZEPAM 5 MG PO TABS: ORAL_TABLET | ORAL | 0 refills | Status: AC

## 2022-11-03 NOTE — Plan of Care (Signed)
Patient is alert and oriented x 4. Discharge teaching given. Problem: Education: Goal: Knowledge of General Education information will improve Description: Including pain rating scale, medication(s)/side effects and non-pharmacologic comfort measures Outcome: Completed/Met   Problem: Health Behavior/Discharge Planning: Goal: Ability to manage health-related needs will improve Outcome: Completed/Met   Problem: Clinical Measurements: Goal: Ability to maintain clinical measurements within normal limits will improve Outcome: Completed/Met Goal: Will remain free from infection Outcome: Completed/Met Goal: Diagnostic test results will improve Outcome: Completed/Met Goal: Respiratory complications will improve Outcome: Completed/Met Goal: Cardiovascular complication will be avoided Outcome: Completed/Met   Problem: Activity: Goal: Risk for activity intolerance will decrease Outcome: Completed/Met   Problem: Nutrition: Goal: Adequate nutrition will be maintained Outcome: Completed/Met   Problem: Coping: Goal: Level of anxiety will decrease Outcome: Completed/Met   Problem: Elimination: Goal: Will not experience complications related to bowel motility Outcome: Completed/Met Goal: Will not experience complications related to urinary retention Outcome: Completed/Met   Problem: Pain Managment: Goal: General experience of comfort will improve Outcome: Completed/Met   Problem: Safety: Goal: Ability to remain free from injury will improve Outcome: Completed/Met   Problem: Skin Integrity: Goal: Risk for impaired skin integrity will decrease Outcome: Completed/Met

## 2022-11-03 NOTE — Discharge Summary (Signed)
Physician Discharge Summary   Patient: Anne Wells MRN: [id number] DOB: 07-May-1950  Admit date:     10/31/2022  Discharge date: 11/03/22  Discharge Physician: Lurene Shadow   PCP: Emogene Morgan, MD   Recommendations at discharge:   Follow-up with PCP in 1 week  Discharge Diagnoses: Principal Problem:   AKI (acute kidney injury) (HCC) Active Problems:   Hypokalemia   Hypotension   Alcohol use disorder   Transaminitis   Thrombocytopenia, idiopathic (HCC)   COPD (chronic obstructive pulmonary disease) (HCC)   Atrial fibrillation (HCC)   Malignant neoplasm of right lung (HCC)   Hypomagnesemia   Hypophosphatemia   Neutropenia (HCC)  Resolved Problems:   * No resolved hospital problems. *  Hospital Course:  Anne Wells is a 72 y.o. female with medical history significant of alcohol use disorder, atrial fibrillation on Eliquis and amiodarone, COPD, hypertension, OSA not on CPAP, lung cancer s/p radiation, who presented to the hospital with vomiting, lower back pain and poor urine output.  She reported heavy alcohol use about 3 days prior to admission.  When she stopped drinking, she developed vomiting, low back pain and poor urine output. She said she does not drink daily but when she drinks she normally drinks about half a gallon of liquor.   She was admitted to the hospital for AKI, hypotension and electrolyte abnormalities.   Assessment and Plan:   AKI in the setting of recent heavy alcohol use: Resolved     Hypotension: Resolved Hypertension: She only takes metoprolol at home.  She no longer takes diltiazem and enalapril.     Neutropenia/leukopenia: Improving.  Outpatient follow-up with PCP recommended    Hypomagnesemia: She was given another dose of IV magnesium sulfate prior to discharge.  She will be discharged on oral magnesium oxide supplement. Lactic acidosis, hypokalemia and hypophosphatemia: Resolved   Paroxysmal atrial fibrillation: Continue  amiodarone, propranolol and Eliquis.     Alcohol use disorder with alcohol withdrawal syndrome: She has been advised to stop drinking alcohol because of pancytopenia and hepatic asteatosis and high risk for developing liver cirrhosis. She will be discharged on diazepam to help with alcohol cessation.    Low back pain: Improved. Xray lumbar spine was unremarkable     Chronic thrombocytopenia and elevated liver enzymes: These are likely due to alcohol use disorder. Chart review showed MRI abdomen in March 2023 showed hepatic steatosis.  Outpatient follow-up recommended.     Severe COPD: Stable.  Continue bronchodilators as needed.     History of right lung cancer: S/p radiation therapy completed in 2023    Her condition has improved and she is deemed stable for discharge to home today. Discharge plan discussed with her husband at the bedside, Anne Wells and Anne Wells (daughters) over video call in the patient's room.         Consultants: None Procedures performed: None  Disposition: Home Diet recommendation:  Discharge Diet Orders (From admission, onward)     Start     Ordered   11/03/22 0000  Diet - low sodium heart healthy        11/03/22 1043           Cardiac diet DISCHARGE MEDICATION: Allergies as of 11/03/2022       Reactions   Shellfish Allergy Swelling   Swelling of the tongue   Penicillin G Rash        Medication List     STOP taking these medications    diltiazem 180 MG  24 hr capsule Commonly known as: CARDIZEM CD   enalapril 10 MG tablet Commonly known as: VASOTEC       TAKE these medications    Acetaminophen 8 Hour 650 MG CR tablet Generic drug: acetaminophen Take 650-1,300 mg by mouth every 8 (eight) hours as needed for pain.   acyclovir 400 MG tablet Commonly known as: ZOVIRAX Take 400 mg by mouth 2 (two) times daily.   albuterol (2.5 MG/3ML) 0.083% nebulizer solution Commonly known as: PROVENTIL Inhale 3 mLs into the lungs every  6 (six) hours as needed. What changed: Another medication with the same name was changed. Make sure you understand how and when to take each.   albuterol 108 (90 Base) MCG/ACT inhaler Commonly known as: VENTOLIN HFA INHALE TWO (2) PUFFS BY MOUTH EVERY 6 HOURS AS NEEDED FOR WHEEZING OR FOR SHORTNESS OF BREATH What changed: See the new instructions.   amiodarone 200 MG tablet Commonly known as: PACERONE Take 1 tablet (200 mg total) by mouth 2 (two) times daily.   apixaban 5 MG Tabs tablet Commonly known as: ELIQUIS Take 1 tablet (5 mg total) by mouth 2 (two) times daily.   cyclobenzaprine 5 MG tablet Commonly known as: FLEXERIL Take 5 mg by mouth 2 (two) times daily.   diazepam 5 MG tablet Commonly known as: Valium Take 2 tablets (10 mg total) by mouth 2 (two) times daily for 2 days, THEN 1 tablet (5 mg total) 2 (two) times daily for 2 days. Start taking on: November 03, 2022   gabapentin 600 MG tablet Commonly known as: NEURONTIN Take 600 mg by mouth 2 (two) times daily.   metoprolol succinate 25 MG 24 hr tablet Commonly known as: TOPROL-XL Take 25 mg by mouth daily.   Multivitamin Adult (Minerals) Tabs Take 1 tablet by mouth daily.   omeprazole 20 MG capsule Commonly known as: PRILOSEC Take 20 mg by mouth daily.   Stiolto Respimat 2.5-2.5 MCG/ACT Aers Generic drug: Tiotropium Bromide-Olodaterol Inhale 2 Inhalers into the lungs daily at 12 noon.        Discharge Exam: Filed Weights   10/31/22 1137 11/01/22 2257 11/03/22 0500  Weight: 88.9 kg 92.3 kg 92.2 kg   GEN: NAD SKIN: No rash EYES: EOMI ENT: MMM CV: RRR PULM: CTA B ABD: soft, ND, NT, +BS CNS: AAO x 3, non focal EXT: No edema or tenderness   Condition at discharge: good  The results of significant diagnostics from this hospitalization (including imaging, microbiology, ancillary and laboratory) are listed below for reference.   Imaging Studies: DG Lumbar Spine 2-3 Views  Result Date:  11/01/2022 CLINICAL DATA:  Low back pain EXAM: LUMBAR SPINE - 2-3 VIEW COMPARISON:  CT 10/05/2022 FINDINGS: No acute fracture evidence of traumatic listhesis. Intervertebral disc space height is maintained. Mild lower lumbar facet arthropathy. IMPRESSION: No acute fracture or traumatic listhesis. Electronically Signed   By: Minerva Fester M.D.   On: 11/01/2022 18:22   DG Chest Port 1 View  Result Date: 10/31/2022 CLINICAL DATA:  Oliguria, low back pain, and vomiting EXAM: PORTABLE CHEST 1 VIEW COMPARISON:  Chest radiograph dated 07/01/2022 FINDINGS: Similar right upper lobe scarring. Left retrocardiac rounded opacity. No focal consolidations. No pleural effusion or pneumothorax. Enlarged cardiomediastinal silhouette is likely projectional. No acute osseous abnormality. IMPRESSION: Left retrocardiac rounded opacity, likely hiatal hernia. Otherwise, no focal consolidations. Electronically Signed   By: Agustin Cree M.D.   On: 10/31/2022 12:44   US ABDOMEN LIMITED RUQ (LIVER/GB)  Result Date: 10/05/2022  CLINICAL DATA:  Pancreatitis. EXAM: ULTRASOUND ABDOMEN LIMITED RIGHT UPPER QUADRANT COMPARISON:  None Available. FINDINGS: Gallbladder: A 5.8 mm nonshadowing gallbladder polyp is seen along the nondependent wall of the gallbladder lumen. No gallstones or wall thickening visualized (2.1 mm). No sonographic Murphy sign noted by sonographer. Common bile duct: Diameter: 4.4 mm Liver: No focal lesion identified. Diffusely increased echogenicity of the liver parenchyma is noted. Portal vein is patent on color Doppler imaging with normal direction of blood flow towards the liver. Other: None. IMPRESSION: 1. 5.8 mm, likely benign gallbladder polyp. No follow-up imaging is recommended. This recommendation follows ACR consensus guidelines: White Paper of the ACR Incidental Findings Committee II on Gallbladder and Biliary Findings. J Am Coll Radiol 2013:;10:953-956. 2. Hepatic steatosis without focal liver lesions.  Electronically Signed   By: Aram Candela M.D.   On: 10/05/2022 20:45   CT Renal Stone Study  Result Date: 10/05/2022 CLINICAL DATA:  Bladder neck obstruction.  Acute kidney injury. EXAM: CT ABDOMEN AND PELVIS WITHOUT CONTRAST TECHNIQUE: Multidetector CT imaging of the abdomen and pelvis was performed following the standard protocol without IV contrast. RADIATION DOSE REDUCTION: This exam was performed according to the departmental dose-optimization program which includes automated exposure control, adjustment of the mA and/or kV according to patient size and/or use of iterative reconstruction technique. COMPARISON:  CT of the chest on 08/16/2022 FINDINGS: Lower chest: There are scattered emphysematous changes. Heart size is normal. Moderate hiatal hernia. Hepatobiliary: The liver is enlarged, 20.2 centimeters in craniocaudal length. There is focal fatty infiltration adjacent to the falciform ligament. The liver parenchyma does not meet density criteria for a hepatic steatosis. No discrete liver lesion. Gallbladder is present. Pancreas: Unremarkable. No pancreatic ductal dilatation or surrounding inflammatory changes. Spleen: Normal in size without focal abnormality. Adrenals/Urinary Tract: Adrenal glands are normal. The kidneys are symmetric, without mass or hydronephrosis. No intrarenal or ureteral stones are present. The bladder and visualized portion of the urethra are normal. No evidence for pelvic floor laxity. Stomach/Bowel: Moderate hiatal hernia. Otherwise the stomach is normal in appearance. Small bowel loops are normal in caliber and wall thickness. There are numerous colonic diverticula but no acute diverticulitis. Appendix is normal in caliber, without surrounding inflammatory changes. Vascular/Lymphatic: There is atherosclerotic calcification of the abdominal aorta, not associated with aneurysm. No retroperitoneal or mesenteric adenopathy. Reproductive: The uterus is present.  No adnexal mass.  Other: Abdominal wall is unremarkable.  No ascites. Musculoskeletal: No acute or significant osseous findings. IMPRESSION: 1. No evidence for urinary tract stone or obstruction. 2. Normal appearance of the bladder and visualized portion of the urethra. 3. Moderate hiatal hernia. 4. Hepatomegaly without evidence for hepatic steatosis. 5. Colonic diverticulosis. 6. Aortic Atherosclerosis (ICD10-I70.0) and Emphysema (ICD10-J43.9). Electronically Signed   By: Norva Pavlov M.D.   On: 10/05/2022 17:00    Microbiology: Results for orders placed or performed during the hospital encounter of 10/31/22  Resp panel by RT-PCR (RSV, Flu A&B, Covid) Anterior Nasal Swab     Status: None   Collection Time: 10/31/22 12:14 PM   Specimen: Anterior Nasal Swab  Result Value Ref Range Status   SARS Coronavirus 2 by RT PCR NEGATIVE NEGATIVE Final    Comment: (NOTE) SARS-CoV-2 target nucleic acids are NOT DETECTED.  The SARS-CoV-2 RNA is generally detectable in upper respiratory specimens during the acute phase of infection. The lowest concentration of SARS-CoV-2 viral copies this assay can detect is 138 copies/mL. A negative result does not preclude SARS-Cov-2 infection and should not be  used as the sole basis for treatment or other patient management decisions. A negative result may occur with  improper specimen collection/handling, submission of specimen other than nasopharyngeal swab, presence of viral mutation(s) within the areas targeted by this assay, and inadequate number of viral copies(<138 copies/mL). A negative result must be combined with clinical observations, patient history, and epidemiological information. The expected result is Negative.  Fact Sheet for Patients:  BloggerCourse.com  Fact Sheet for Healthcare Providers:  SeriousBroker.it  This test is no t yet approved or cleared by the [place] FDA and  has been authorized for  detection and/or diagnosis of SARS-CoV-2 by FDA under an Emergency Use Authorization (EUA). This EUA will remain  in effect (meaning this test can be used) for the duration of the COVID-19 declaration under Section 564(b)(1) of the Act, 21 U.S.C.section 360bbb-3(b)(1), unless the authorization is terminated  or revoked sooner.       Influenza A by PCR NEGATIVE NEGATIVE Final   Influenza B by PCR NEGATIVE NEGATIVE Final    Comment: (NOTE) The Xpert Xpress SARS-CoV-2/FLU/RSV plus assay is intended as an aid in the diagnosis of influenza from Nasopharyngeal swab specimens and should not be used as a sole basis for treatment. Nasal washings and aspirates are unacceptable for Xpert Xpress SARS-CoV-2/FLU/RSV testing.  Fact Sheet for Patients: BloggerCourse.com  Fact Sheet for Healthcare Providers: SeriousBroker.it  This test is not yet approved or cleared by the [place] FDA and has been authorized for detection and/or diagnosis of SARS-CoV-2 by FDA under an Emergency Use Authorization (EUA). This EUA will remain in effect (meaning this test can be used) for the duration of the COVID-19 declaration under Section 564(b)(1) of the Act, 21 U.S.C. section 360bbb-3(b)(1), unless the authorization is terminated or revoked.     Resp Syncytial Virus by PCR NEGATIVE NEGATIVE Final    Comment: (NOTE) Fact Sheet for Patients: BloggerCourse.com  Fact Sheet for Healthcare Providers: SeriousBroker.it  This test is not yet approved or cleared by the [place] FDA and has been authorized for detection and/or diagnosis of SARS-CoV-2 by FDA under an Emergency Use Authorization (EUA). This EUA will remain in effect (meaning this test can be used) for the duration of the COVID-19 declaration under Section 564(b)(1) of the Act, 21 U.S.C. section 360bbb-3(b)(1), unless the authorization is  terminated or revoked.  Performed at [facility], [address] [place], [place] [address]   Blood Culture (routine x 2)     Status: None (Preliminary result)   Collection Time: 10/31/22 12:14 PM   Specimen: BLOOD  Result Value Ref Range Status   Specimen Description BLOOD RIGTH WRIST  Final   Special Requests   Final    BOTTLES DRAWN AEROBIC AND ANAEROBIC Blood Culture results may not be optimal due to an inadequate volume of blood received in culture bottles   Culture   Final    NO GROWTH 3 DAYS Performed at [facility], [address] [place], [place] [address]    Report Status PENDING  Incomplete  Blood Culture (routine x 2)     Status: None (Preliminary result)   Collection Time: 10/31/22  2:08 PM   Specimen: BLOOD  Result Value Ref Range Status   Specimen Description BLOOD BLOOD RIGHT FOREARM  Final   Special Requests   Final    BOTTLES DRAWN AEROBIC AND ANAEROBIC Blood Culture adequate volume   Culture   Final    NO GROWTH 3 DAYS Performed at [facility], [address] [place], [place] [address]    Report Status PENDING  Incomplete    Labs: CBC: Recent Labs  Lab 10/31/22 1138 11/01/22 0626 11/02/22 0605 11/03/22 0505  WBC 4.7 3.0* 1.9* 2.2*  NEUTROABS 3.5  --  0.8* 1.1*  HGB 12.0 9.6* 9.1* 9.0*  HCT 38.0 29.9* 28.7* 29.1*  MCV 96.7 96.8 97.0 97.3  PLT 109* 88* 81* 99*   Basic Metabolic Panel: Recent Labs  Lab 10/31/22 1138 10/31/22 2042 11/01/22 0626 11/02/22 0500 11/02/22 0605 11/03/22 0505  NA 131* 132* 135  --  138 136  K 3.5 3.2* 3.5  --  3.9 3.9  CL 90* 96* 98  --  103 102  CO2 25 24 27   --  27 27  GLUCOSE 112* 132* 98  --  85 89  BUN 27* 26* 25*  --  19 19  CREATININE 4.16* 2.58* 1.72*  --  0.96 0.88  CALCIUM 9.9 9.0 9.1  --  8.6* 8.9  MG  --   --  1.3* 1.5*  --  1.6*  PHOS  --   --  1.2* 3.6  --  4.0   Liver Function Tests: Recent Labs  Lab 10/31/22 1138 11/01/22 0626  AST 79* 65*  ALT 52* 42   ALKPHOS 77 59  BILITOT 0.9 0.8  PROT 8.2* 6.5  ALBUMIN 4.4 3.4*   CBG: No results for input(s): "GLUCAP" in the last 168 hours.  Discharge time spent: greater than 30 minutes.  Signed: Lurene Shadow, MD [facility] 11/03/2022

## 2022-11-05 LAB — CULTURE, BLOOD (ROUTINE X 2)
Culture: NO GROWTH
Special Requests: ADEQUATE

## 2022-11-05 LAB — PATHOLOGIST SMEAR REVIEW

## 2022-11-08 ENCOUNTER — Ambulatory Visit: Payer: 59 | Admitting: Cardiovascular Disease

## 2022-11-19 ENCOUNTER — Ambulatory Visit: Payer: 59

## 2022-11-19 DIAGNOSIS — C3411 Malignant neoplasm of upper lobe, right bronchus or lung: Secondary | ICD-10-CM

## 2022-11-19 DIAGNOSIS — G4733 Obstructive sleep apnea (adult) (pediatric): Secondary | ICD-10-CM

## 2022-11-19 DIAGNOSIS — I480 Paroxysmal atrial fibrillation: Secondary | ICD-10-CM

## 2022-11-19 DIAGNOSIS — I340 Nonrheumatic mitral (valve) insufficiency: Secondary | ICD-10-CM

## 2022-11-19 DIAGNOSIS — I10 Essential (primary) hypertension: Secondary | ICD-10-CM

## 2022-11-21 ENCOUNTER — Other Ambulatory Visit: Payer: Self-pay | Admitting: Family Medicine

## 2022-11-21 DIAGNOSIS — Z1231 Encounter for screening mammogram for malignant neoplasm of breast: Secondary | ICD-10-CM

## 2022-11-22 ENCOUNTER — Ambulatory Visit (INDEPENDENT_AMBULATORY_CARE_PROVIDER_SITE_OTHER): Payer: 59 | Admitting: Cardiovascular Disease

## 2022-11-22 ENCOUNTER — Encounter: Payer: Self-pay | Admitting: Cardiovascular Disease

## 2022-11-22 VITALS — BP 120/79 | HR 72 | Ht 69.0 in | Wt 207.0 lb

## 2022-11-22 DIAGNOSIS — I480 Paroxysmal atrial fibrillation: Secondary | ICD-10-CM | POA: Diagnosis not present

## 2022-11-22 DIAGNOSIS — R0602 Shortness of breath: Secondary | ICD-10-CM | POA: Diagnosis not present

## 2022-11-22 DIAGNOSIS — J449 Chronic obstructive pulmonary disease, unspecified: Secondary | ICD-10-CM

## 2022-11-22 DIAGNOSIS — I10 Essential (primary) hypertension: Secondary | ICD-10-CM

## 2022-11-22 DIAGNOSIS — I5033 Acute on chronic diastolic (congestive) heart failure: Secondary | ICD-10-CM

## 2022-11-22 MED ORDER — EMPAGLIFLOZIN 25 MG PO TABS
25.0000 mg | ORAL_TABLET | Freq: Every day | ORAL | 2 refills | Status: DC
Start: 2022-11-22 — End: 2022-12-26

## 2022-11-22 NOTE — Progress Notes (Signed)
Cardiology Office Note   Date:  11/22/2022   ID:  SUTTYN DERIGGI, DOB Aug 24, 1950, MRN [id number]  PCP:  Emogene Morgan, MD  Cardiologist:  Adrian Blackwater, MD      History of Present Illness: Anne Wells is a 72 y.o. female who presents for  Chief Complaint  Patient presents with   Follow-up    4 week f/u     Has back pain, no chest pain but is SOB      Past Medical History:  Diagnosis Date   Atrial fibrillation with RVR (HCC) 02/21/2021   COPD (chronic obstructive pulmonary disease) (HCC)      Past Surgical History:  Procedure Laterality Date   Dental procedure       Current Outpatient Medications  Medication Sig Dispense Refill   albuterol (VENTOLIN HFA) 108 (90 Base) MCG/ACT inhaler INHALE TWO (2) PUFFS BY MOUTH EVERY 6 HOURS AS NEEDED FOR WHEEZING OR FOR SHORTNESS OF BREATH (Patient taking differently: Inhale 2 puffs into the lungs every 6 (six) hours as needed for wheezing or shortness of breath.) 8.5 g 10   amiodarone (PACERONE) 200 MG tablet Take 1 tablet (200 mg total) by mouth 2 (two) times daily. 60 tablet 0   empagliflozin (JARDIANCE) 25 MG TABS tablet Take 1 tablet (25 mg total) by mouth daily. 30 tablet 2   ACETAMINOPHEN 8 HOUR 650 MG CR tablet Take 650-1,300 mg by mouth every 8 (eight) hours as needed for pain.     acyclovir (ZOVIRAX) 400 MG tablet Take 400 mg by mouth 2 (two) times daily.     albuterol (PROVENTIL) (2.5 MG/3ML) 0.083% nebulizer solution Inhale 3 mLs into the lungs every 6 (six) hours as needed.     apixaban (ELIQUIS) 5 MG TABS tablet Take 1 tablet (5 mg total) by mouth 2 (two) times daily. 60 tablet 0   cyclobenzaprine (FLEXERIL) 5 MG tablet Take 5 mg by mouth 2 (two) times daily.     gabapentin (NEURONTIN) 600 MG tablet Take 600 mg by mouth 2 (two) times daily.     magnesium oxide (MAG-OX) 400 MG tablet Take 1 tablet (400 mg total) by mouth daily. 10 tablet 0   metoprolol succinate (TOPROL-XL) 25 MG 24 hr tablet Take 25 mg by mouth  daily. (Patient not taking: Reported on 11/22/2022)     Multiple Vitamins-Minerals (MULTIVITAMIN ADULT, MINERALS,) TABS Take 1 tablet by mouth daily.     omeprazole (PRILOSEC) 20 MG capsule Take 20 mg by mouth daily.     Tiotropium Bromide-Olodaterol (STIOLTO RESPIMAT) 2.5-2.5 MCG/ACT AERS Inhale 2 Inhalers into the lungs daily at 12 noon.     No current facility-administered medications for this visit.    Allergies:   Shellfish allergy and Penicillin g    Social History:   reports that she quit smoking about 18 years ago. Her smoking use included cigarettes. She started smoking about 53 years ago. She has a 35 pack-year smoking history. She has never used smokeless tobacco. She reports current alcohol use. She reports that she does not use drugs.   Family History:  family history includes Bone cancer in her brother and mother; COPD in her brother and father.    ROS:     Review of Systems  Constitutional: Negative.   HENT: Negative.    Eyes: Negative.   Respiratory: Negative.    Gastrointestinal: Negative.   Genitourinary: Negative.   Musculoskeletal: Negative.   Skin: Negative.   Neurological: Negative.  Endo/Heme/Allergies: Negative.   Psychiatric/Behavioral: Negative.    All other systems reviewed and are negative.     All other systems are reviewed and negative.    PHYSICAL EXAM: VS:  BP 120/79   Pulse 72   Ht 5\' 9"  (1.753 m)   Wt 207 lb (93.9 kg)   SpO2 92%   BMI 30.57 kg/m  , BMI Body mass index is 30.57 kg/m. Last weight:  Wt Readings from Last 3 Encounters:  11/22/22 207 lb (93.9 kg)  11/03/22 203 lb 4.2 oz (92.2 kg)  10/11/22 195 lb 12.8 oz (88.8 kg)     Physical Exam Constitutional:      Appearance: Normal appearance.  Cardiovascular:     Rate and Rhythm: Normal rate and regular rhythm.     Heart sounds: Normal heart sounds.  Pulmonary:     Effort: Pulmonary effort is normal.     Breath sounds: Normal breath sounds.  Musculoskeletal:     Right  lower leg: No edema.     Left lower leg: No edema.  Neurological:     Mental Status: She is alert.       EKG:   Recent Labs: 11/01/2022: ALT 42 11/03/2022: BUN 19; Creatinine, Ser 0.88; Hemoglobin 9.0; Magnesium 1.6; Platelets 99; Potassium 3.9; Sodium 136    Lipid Panel    Component Value Date/Time   CHOL 355 (H) 02/22/2021 0403   TRIG 51 02/22/2021 0403   HDL 179 02/22/2021 0403   CHOLHDL 2.0 02/22/2021 0403   VLDL 10 02/22/2021 0403   LDLCALC 166 (H) 02/22/2021 0403      Other studies Reviewed: Additional studies/ records that were reviewed today include:  Review of the above records demonstrates:       No data to display            ASSESSMENT AND PLAN:    ICD-10-CM   1. Paroxysmal atrial fibrillation (HCC)  I48.0 empagliflozin (JARDIANCE) 25 MG TABS tablet    2. Essential hypertension  I10 empagliflozin (JARDIANCE) 25 MG TABS tablet    3. COPD, severe (HCC)  J44.9 empagliflozin (JARDIANCE) 25 MG TABS tablet    4. SOB (shortness of breath)  R06.02 empagliflozin (JARDIANCE) 25 MG TABS tablet    5. CHF (congestive heart failure), NYHA class III, acute on chronic, diastolic (HCC)  I50.33 empagliflozin (JARDIANCE) 25 MG TABS tablet   add jaudiance as has diastolic dysfunction and DOE       Problem List Items Addressed This Visit       Cardiovascular and Mediastinum   Atrial fibrillation (HCC) - Primary   Relevant Medications   empagliflozin (JARDIANCE) 25 MG TABS tablet   Essential hypertension   Relevant Medications   empagliflozin (JARDIANCE) 25 MG TABS tablet     Respiratory   COPD, severe (HCC)   Relevant Medications   empagliflozin (JARDIANCE) 25 MG TABS tablet   Other Visit Diagnoses     SOB (shortness of breath)       Relevant Medications   empagliflozin (JARDIANCE) 25 MG TABS tablet   CHF (congestive heart failure), NYHA class III, acute on chronic, diastolic (HCC)       add jaudiance as has diastolic dysfunction and DOE   Relevant  Medications   empagliflozin (JARDIANCE) 25 MG TABS tablet          Disposition:   No follow-ups on file.    Total time spent: 30 minutes  Signed,  Adrian Blackwater, MD  11/22/2022 11:23 AM  [facility]

## 2022-11-23 ENCOUNTER — Other Ambulatory Visit: Payer: Self-pay | Admitting: *Deleted

## 2022-11-23 ENCOUNTER — Inpatient Hospital Stay
Admission: RE | Admit: 2022-11-23 | Discharge: 2022-11-23 | Disposition: A | Discharge status: Home / Self Care | Payer: Self-pay | Source: Ambulatory Visit | Attending: Family Medicine | Admitting: Family Medicine

## 2022-11-23 DIAGNOSIS — Z1231 Encounter for screening mammogram for malignant neoplasm of breast: Secondary | ICD-10-CM

## 2022-11-26 ENCOUNTER — Other Ambulatory Visit: Payer: Self-pay | Admitting: Family Medicine

## 2022-11-26 DIAGNOSIS — Z780 Asymptomatic menopausal state: Secondary | ICD-10-CM

## 2022-12-25 ENCOUNTER — Ambulatory Visit (INDEPENDENT_AMBULATORY_CARE_PROVIDER_SITE_OTHER): Payer: 59 | Admitting: Cardiovascular Disease

## 2022-12-25 ENCOUNTER — Encounter: Payer: Self-pay | Admitting: Cardiovascular Disease

## 2022-12-25 VITALS — BP 111/62 | HR 105 | Ht 68.0 in | Wt 195.0 lb

## 2022-12-25 DIAGNOSIS — I480 Paroxysmal atrial fibrillation: Secondary | ICD-10-CM

## 2022-12-25 DIAGNOSIS — I5033 Acute on chronic diastolic (congestive) heart failure: Secondary | ICD-10-CM | POA: Diagnosis not present

## 2022-12-25 DIAGNOSIS — I4711 Inappropriate sinus tachycardia, so stated: Secondary | ICD-10-CM

## 2022-12-25 DIAGNOSIS — I10 Essential (primary) hypertension: Secondary | ICD-10-CM | POA: Diagnosis not present

## 2022-12-25 DIAGNOSIS — R0602 Shortness of breath: Secondary | ICD-10-CM | POA: Diagnosis not present

## 2022-12-25 MED ORDER — METOPROLOL SUCCINATE ER 50 MG PO TB24
50.0000 mg | ORAL_TABLET | Freq: Every day | ORAL | 11 refills | Status: DC
Start: 2022-12-25 — End: 2023-12-25

## 2022-12-25 NOTE — Progress Notes (Signed)
Cardiology Office Note   Date:  12/25/2022   ID:  Anne Wells, DOB [birth year], MRN [id number]  PCP:  Emogene Morgan, MD  Cardiologist:  Adrian Blackwater, MD      History of Present Illness: Anne Wells is a 72 y.o. female who presents for  Chief Complaint  Patient presents with   Follow-up    Have headache, no palpitation. Heart rate high      Past Medical History:  Diagnosis Date   Atrial fibrillation with RVR (HCC) 02/21/2021   COPD (chronic obstructive pulmonary disease) (HCC)      Past Surgical History:  Procedure Laterality Date   Dental procedure       Current Outpatient Medications  Medication Sig Dispense Refill   metoprolol succinate (TOPROL XL) 50 MG 24 hr tablet Take 1 tablet (50 mg total) by mouth daily. Take with or immediately following a meal. 30 tablet 11   ACETAMINOPHEN 8 HOUR 650 MG CR tablet Take 650-1,300 mg by mouth every 8 (eight) hours as needed for pain.     acyclovir (ZOVIRAX) 400 MG tablet Take 400 mg by mouth 2 (two) times daily.     albuterol (PROVENTIL) (2.5 MG/3ML) 0.083% nebulizer solution Inhale 3 mLs into the lungs every 6 (six) hours as needed.     albuterol (VENTOLIN HFA) 108 (90 Base) MCG/ACT inhaler INHALE TWO (2) PUFFS BY MOUTH EVERY 6 HOURS AS NEEDED FOR WHEEZING OR FOR SHORTNESS OF BREATH (Patient taking differently: Inhale 2 puffs into the lungs every 6 (six) hours as needed for wheezing or shortness of breath.) 8.5 g 10   amiodarone (PACERONE) 200 MG tablet Take 1 tablet (200 mg total) by mouth 2 (two) times daily. 60 tablet 0   apixaban (ELIQUIS) 5 MG TABS tablet Take 1 tablet (5 mg total) by mouth 2 (two) times daily. 60 tablet 0   cyclobenzaprine (FLEXERIL) 5 MG tablet Take 5 mg by mouth 2 (two) times daily.     empagliflozin (JARDIANCE) 25 MG TABS tablet Take 1 tablet (25 mg total) by mouth daily. 30 tablet 2   gabapentin (NEURONTIN) 600 MG tablet Take 600 mg by mouth 2 (two) times daily.     magnesium oxide (MAG-OX)  400 MG tablet Take 1 tablet (400 mg total) by mouth daily. 10 tablet 0   Multiple Vitamins-Minerals (MULTIVITAMIN ADULT, MINERALS,) TABS Take 1 tablet by mouth daily.     omeprazole (PRILOSEC) 20 MG capsule Take 20 mg by mouth daily.     Tiotropium Bromide-Olodaterol (STIOLTO RESPIMAT) 2.5-2.5 MCG/ACT AERS Inhale 2 Inhalers into the lungs daily at 12 noon.     No current facility-administered medications for this visit.    Allergies:   Shellfish allergy and Penicillin g    Social History:   reports that she quit smoking about 18 years ago. Her smoking use included cigarettes. She started smoking about 53 years ago. She has a 35 pack-year smoking history. She has never used smokeless tobacco. She reports current alcohol use. She reports that she does not use drugs.   Family History:  family history includes Bone cancer in her brother and mother; COPD in her brother and father.    ROS:     Review of Systems  Constitutional: Negative.   HENT: Negative.    Eyes: Negative.   Respiratory: Negative.    Gastrointestinal: Negative.   Genitourinary: Negative.   Musculoskeletal: Negative.   Skin: Negative.   Neurological: Negative.   Endo/Heme/Allergies: Negative.  Psychiatric/Behavioral: Negative.    All other systems reviewed and are negative.     All other systems are reviewed and negative.    PHYSICAL EXAM: VS:  BP 111/62   Pulse (!) 105   Ht 5\' 8"  (1.727 m)   Wt 195 lb (88.5 kg)   SpO2 96%   BMI 29.65 kg/m  , BMI Body mass index is 29.65 kg/m. Last weight:  Wt Readings from Last 3 Encounters:  12/25/22 195 lb (88.5 kg)  11/22/22 207 lb (93.9 kg)  11/03/22 203 lb 4.2 oz (92.2 kg)     Physical Exam Constitutional:      Appearance: Normal appearance.  Cardiovascular:     Rate and Rhythm: Normal rate and regular rhythm.     Heart sounds: Normal heart sounds.  Pulmonary:     Effort: Pulmonary effort is normal.     Breath sounds: Normal breath sounds.   Musculoskeletal:     Right lower leg: No edema.     Left lower leg: No edema.  Neurological:     Mental Status: She is alert.       EKG:   Recent Labs: 11/01/2022: ALT 42 11/03/2022: BUN 19; Creatinine, Ser 0.88; Hemoglobin 9.0; Magnesium 1.6; Platelets 99; Potassium 3.9; Sodium 136    Lipid Panel    Component Value Date/Time   CHOL 355 (H) 02/22/2021 0403   TRIG 51 02/22/2021 0403   HDL 179 02/22/2021 0403   CHOLHDL 2.0 02/22/2021 0403   VLDL 10 02/22/2021 0403   LDLCALC 166 (H) 02/22/2021 0403      Other studies Reviewed: Additional studies/ records that were reviewed today include:  Review of the above records demonstrates:       No data to display            ASSESSMENT AND PLAN:    ICD-10-CM   1. Paroxysmal atrial fibrillation (HCC)  I48.0 metoprolol succinate (TOPROL XL) 50 MG 24 hr tablet   LVEf normal, mild MR/TR    2. Essential hypertension  I10 metoprolol succinate (TOPROL XL) 50 MG 24 hr tablet    3. SOB (shortness of breath)  R06.02     4. CHF (congestive heart failure), NYHA class III, acute on chronic, diastolic (HCC)  I50.33     5. Inappropriate sinus tachycardia  I47.11    Will increase metoprolol to 50 mg from 25 mg daily.       Problem List Items Addressed This Visit       Cardiovascular and Mediastinum   Atrial fibrillation (HCC) - Primary   Relevant Medications   metoprolol succinate (TOPROL XL) 50 MG 24 hr tablet   Essential hypertension   Relevant Medications   metoprolol succinate (TOPROL XL) 50 MG 24 hr tablet   Other Visit Diagnoses     SOB (shortness of breath)       CHF (congestive heart failure), NYHA class III, acute on chronic, diastolic (HCC)       Relevant Medications   metoprolol succinate (TOPROL XL) 50 MG 24 hr tablet   Inappropriate sinus tachycardia       Will increase metoprolol to 50 mg from 25 mg daily.   Relevant Medications   metoprolol succinate (TOPROL XL) 50 MG 24 hr tablet           Disposition:   Return in about 1 month (around 01/24/2023).    Total time spent: 35 minutes  Signed,  Adrian Blackwater, MD  12/25/2022 11:51 AM  [facility]

## 2022-12-26 ENCOUNTER — Other Ambulatory Visit: Payer: Self-pay

## 2022-12-26 DIAGNOSIS — R0602 Shortness of breath: Secondary | ICD-10-CM

## 2022-12-26 DIAGNOSIS — I5033 Acute on chronic diastolic (congestive) heart failure: Secondary | ICD-10-CM

## 2022-12-26 DIAGNOSIS — I480 Paroxysmal atrial fibrillation: Secondary | ICD-10-CM

## 2022-12-26 DIAGNOSIS — J449 Chronic obstructive pulmonary disease, unspecified: Secondary | ICD-10-CM

## 2022-12-26 DIAGNOSIS — I10 Essential (primary) hypertension: Secondary | ICD-10-CM

## 2022-12-27 ENCOUNTER — Other Ambulatory Visit: Payer: Self-pay

## 2022-12-27 DIAGNOSIS — I480 Paroxysmal atrial fibrillation: Secondary | ICD-10-CM

## 2022-12-27 DIAGNOSIS — I10 Essential (primary) hypertension: Secondary | ICD-10-CM

## 2022-12-27 MED ORDER — EMPAGLIFLOZIN 25 MG PO TABS
25.0000 mg | ORAL_TABLET | Freq: Every day | ORAL | 2 refills | Status: DC
Start: 2022-12-27 — End: 2023-02-18

## 2022-12-27 MED ORDER — METOPROLOL SUCCINATE ER 50 MG PO TB24
50.0000 mg | ORAL_TABLET | Freq: Every day | ORAL | 11 refills | Status: AC
Start: 2022-12-27 — End: 2023-12-27

## 2022-12-28 ENCOUNTER — Other Ambulatory Visit: Payer: Self-pay

## 2022-12-28 DIAGNOSIS — I480 Paroxysmal atrial fibrillation: Secondary | ICD-10-CM

## 2022-12-31 ENCOUNTER — Other Ambulatory Visit: Payer: Self-pay

## 2022-12-31 MED ORDER — AMIODARONE HCL 200 MG PO TABS
200.0000 mg | ORAL_TABLET | Freq: Two times a day (BID) | ORAL | 0 refills | Status: DC
Start: 2022-12-31 — End: 2023-01-03

## 2022-12-31 MED ORDER — ALBUTEROL SULFATE HFA 108 (90 BASE) MCG/ACT IN AERS: 2.0000 | INHALATION_SPRAY | Freq: Four times a day (QID) | RESPIRATORY_TRACT | 10 refills | Status: DC | PRN

## 2022-12-31 NOTE — Progress Notes (Signed)
Refill request sent via fax from [facility]. I have sent in the refill and patient is scheduled to see Dr. Jayme Cloud on 02/21/2023.  Nothing further needed.

## 2023-01-01 ENCOUNTER — Ambulatory Visit
Admission: RE | Admit: 2023-01-01 | Discharge: 2023-01-01 | Disposition: A | Discharge status: Home / Self Care | Payer: 59 | Source: Ambulatory Visit | Attending: Family Medicine | Admitting: Family Medicine

## 2023-01-01 DIAGNOSIS — Z780 Asymptomatic menopausal state: Secondary | ICD-10-CM | POA: Insufficient documentation

## 2023-01-01 DIAGNOSIS — Z1231 Encounter for screening mammogram for malignant neoplasm of breast: Secondary | ICD-10-CM | POA: Insufficient documentation

## 2023-01-02 ENCOUNTER — Other Ambulatory Visit: Payer: Self-pay | Admitting: Cardiovascular Disease

## 2023-01-02 DIAGNOSIS — I480 Paroxysmal atrial fibrillation: Secondary | ICD-10-CM

## 2023-01-11 ENCOUNTER — Other Ambulatory Visit: Payer: Self-pay

## 2023-01-11 MED ORDER — ENALAPRIL MALEATE 10 MG PO TABS: 10.0000 mg | ORAL_TABLET | Freq: Every day | ORAL | 2 refills | Status: DC

## 2023-01-11 MED ORDER — DILTIAZEM HCL ER COATED BEADS 180 MG PO CP24: 180.0000 mg | ORAL_CAPSULE | Freq: Two times a day (BID) | ORAL | 2 refills | Status: DC

## 2023-01-14 MED ORDER — ENALAPRIL MALEATE 10 MG PO TABS: 10.0000 mg | ORAL_TABLET | Freq: Every day | ORAL | 0 refills | Status: DC

## 2023-01-14 MED ORDER — METOPROLOL SUCCINATE ER 50 MG PO TB24
50.0000 mg | ORAL_TABLET | Freq: Every day | ORAL | 0 refills | Status: DC
Start: 2023-01-14 — End: 2023-10-03

## 2023-01-25 ENCOUNTER — Ambulatory Visit: Payer: 59 | Admitting: Cardiovascular Disease

## 2023-02-05 IMAGING — CR DG CHEST 2V
1 series · 2 of 2 positions shown · non-contrast
Comparison: April 15, 2021

CLINICAL DATA: Cough and fever.  History of COPD.

EXAM:
CHEST - 2 VIEW

[Series 1: dg chest 2 view · 0.14mm/px · 2 of 2 slices shown]
[im 1/2]
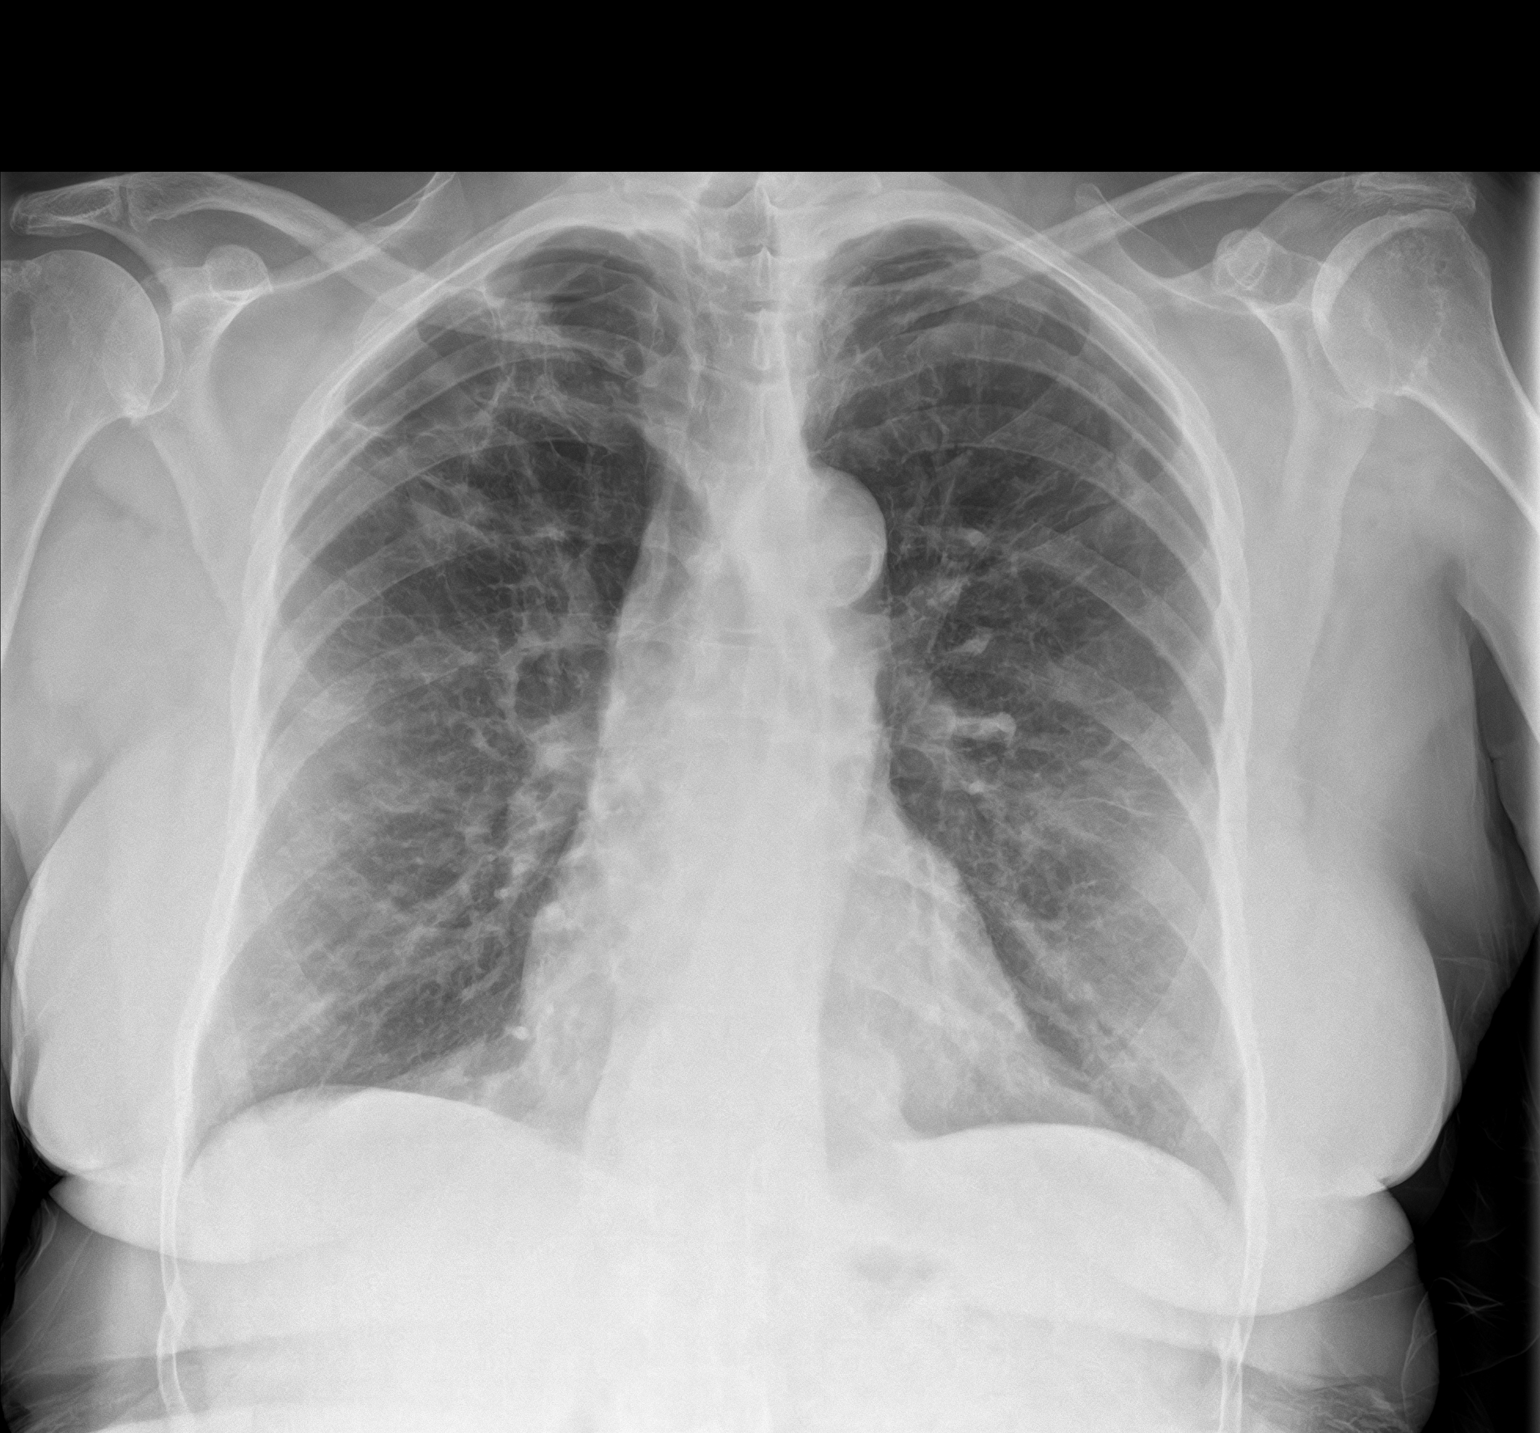
[im 2/2]
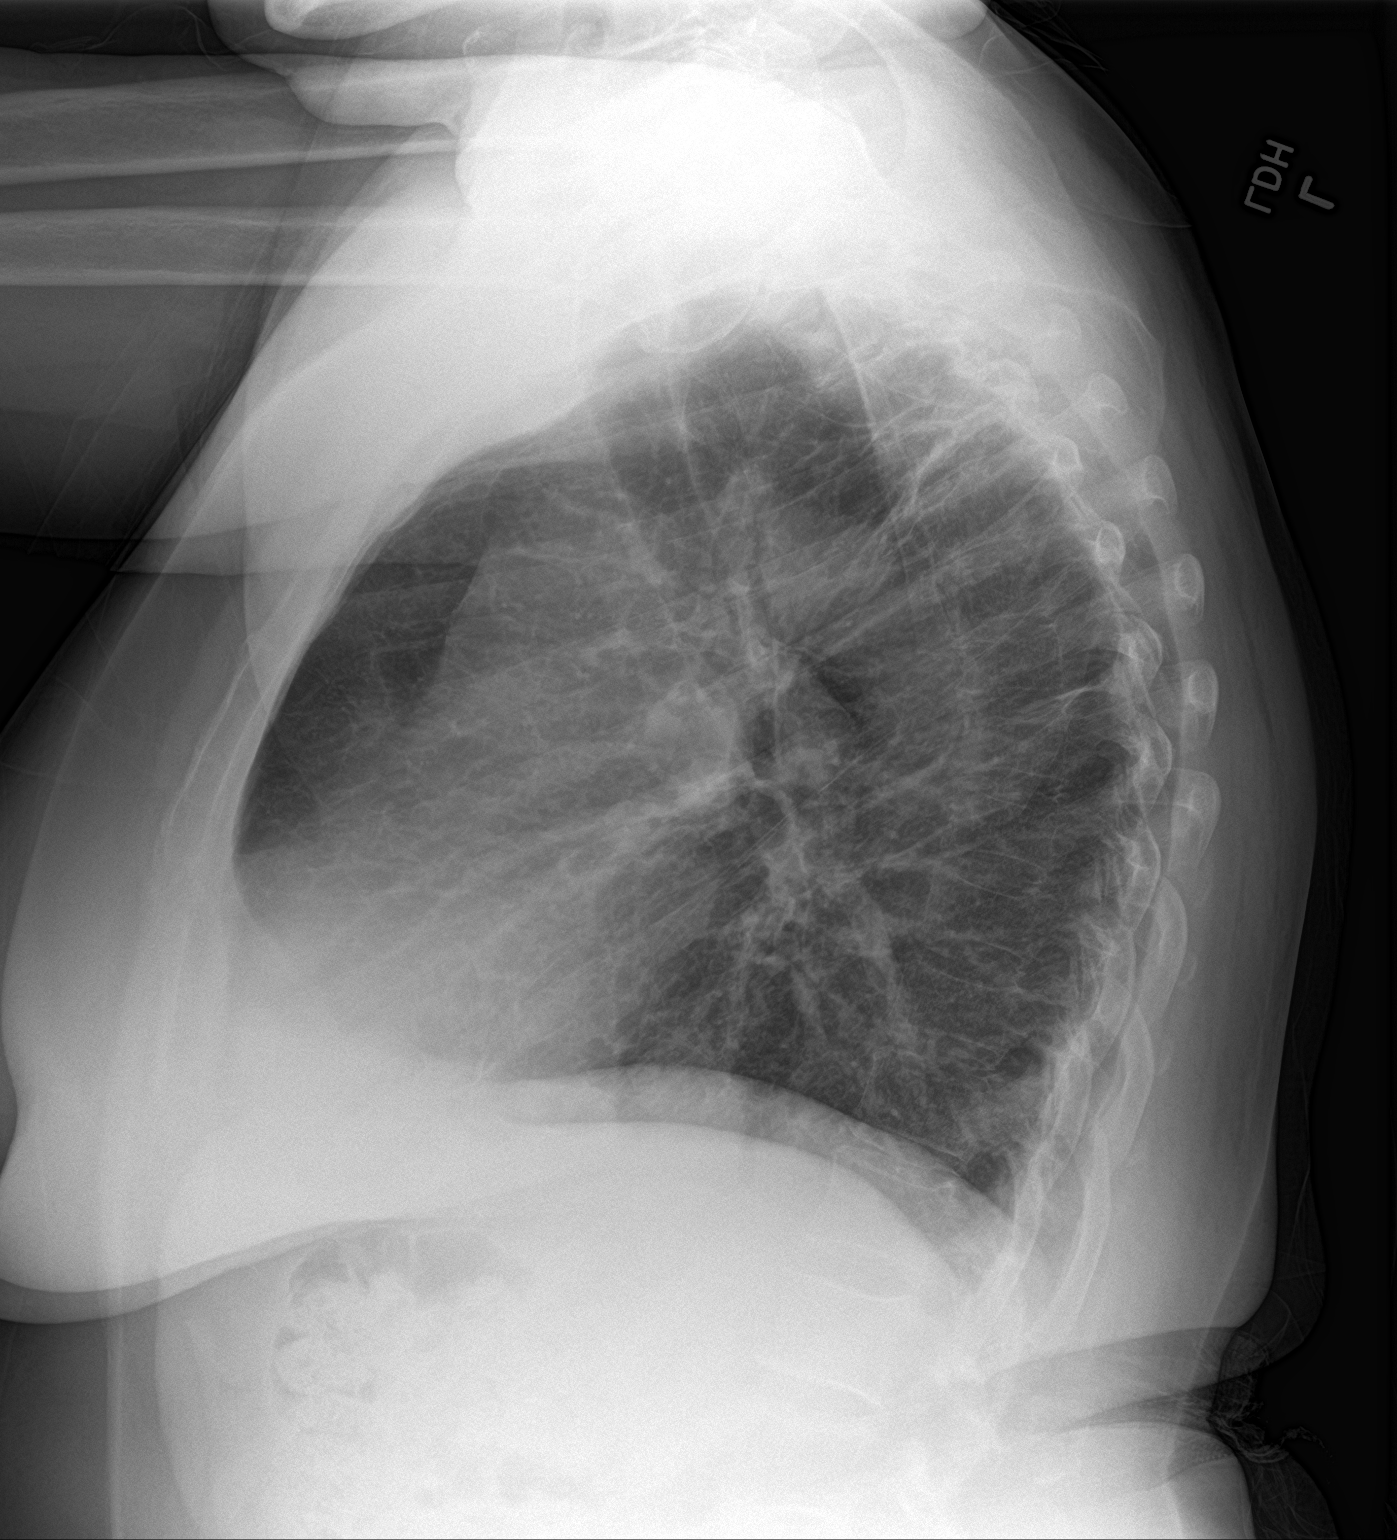

[2 of 2 positions shown; findings below may reference images not displayed]

FINDINGS: Calcific atherosclerotic disease and tortuosity of the aorta.

Cardiomediastinal silhouette is normal. Mediastinal contours appear
intact.

Patchy bilateral airspace consolidation affecting the lungs
symmetrically. Right apical lung cyst is stable. Upper lobe
predominant underlying moderate to severe emphysema.

Osseous structures are without acute abnormality. Soft tissues are
grossly normal.
IMPRESSION: 1. Patchy bilateral airspace consolidation affecting the lungs
symmetrically. Findings are not conclusive but suspicious for
viral/atypical pneumonia.
2. Upper lobe predominant moderate to severe emphysema.

## 2023-02-14 ENCOUNTER — Other Ambulatory Visit: Payer: Self-pay | Admitting: Cardiovascular Disease

## 2023-02-14 DIAGNOSIS — J449 Chronic obstructive pulmonary disease, unspecified: Secondary | ICD-10-CM

## 2023-02-14 DIAGNOSIS — I5033 Acute on chronic diastolic (congestive) heart failure: Secondary | ICD-10-CM

## 2023-02-14 DIAGNOSIS — I480 Paroxysmal atrial fibrillation: Secondary | ICD-10-CM

## 2023-02-14 DIAGNOSIS — I10 Essential (primary) hypertension: Secondary | ICD-10-CM

## 2023-02-14 DIAGNOSIS — R0602 Shortness of breath: Secondary | ICD-10-CM

## 2023-02-21 ENCOUNTER — Ambulatory Visit: Payer: 59 | Admitting: Pulmonary Disease

## 2023-03-05 ENCOUNTER — Ambulatory Visit: Payer: 59

## 2023-03-11 ENCOUNTER — Ambulatory Visit: Payer: 59 | Admitting: Radiation Oncology

## 2023-03-19 ENCOUNTER — Other Ambulatory Visit: Payer: Self-pay | Admitting: Cardiovascular Disease

## 2023-03-20 ENCOUNTER — Other Ambulatory Visit: Payer: Self-pay | Admitting: Cardiovascular Disease

## 2023-03-22 ENCOUNTER — Ambulatory Visit: Admission: RE | Admit: 2023-03-22 | Payer: 59 | Source: Ambulatory Visit

## 2023-04-01 ENCOUNTER — Ambulatory Visit: Payer: 59 | Admitting: Radiation Oncology

## 2023-04-23 ENCOUNTER — Encounter: Payer: Self-pay | Admitting: Pulmonary Disease

## 2023-04-23 ENCOUNTER — Ambulatory Visit (INDEPENDENT_AMBULATORY_CARE_PROVIDER_SITE_OTHER): Payer: 59 | Admitting: Pulmonary Disease

## 2023-04-23 VITALS — BP 110/80 | HR 61 | Temp 97.3°F | Ht 68.0 in | Wt 199.8 lb

## 2023-04-23 DIAGNOSIS — J439 Emphysema, unspecified: Secondary | ICD-10-CM | POA: Diagnosis not present

## 2023-04-23 DIAGNOSIS — J440 Chronic obstructive pulmonary disease with acute lower respiratory infection: Secondary | ICD-10-CM | POA: Diagnosis not present

## 2023-04-23 DIAGNOSIS — R0602 Shortness of breath: Secondary | ICD-10-CM

## 2023-04-23 DIAGNOSIS — J449 Chronic obstructive pulmonary disease, unspecified: Secondary | ICD-10-CM

## 2023-04-23 DIAGNOSIS — J209 Acute bronchitis, unspecified: Secondary | ICD-10-CM

## 2023-04-23 MED ORDER — METHYLPREDNISOLONE 4 MG PO TBPK: ORAL_TABLET | ORAL | 0 refills | Status: DC

## 2023-04-23 MED ORDER — DOXYCYCLINE HYCLATE 100 MG PO TABS: 100.0000 mg | ORAL_TABLET | Freq: Two times a day (BID) | ORAL | 0 refills | Status: AC

## 2023-04-23 MED ORDER — BREZTRI AEROSPHERE 160-9-4.8 MCG/ACT IN AERO: 2.0000 | INHALATION_SPRAY | Freq: Two times a day (BID) | RESPIRATORY_TRACT | 0 refills | Status: DC

## 2023-04-23 MED ORDER — BREZTRI AEROSPHERE 160-9-4.8 MCG/ACT IN AERO: 2.0000 | INHALATION_SPRAY | Freq: Two times a day (BID) | RESPIRATORY_TRACT | 4 refills | Status: DC

## 2023-04-23 NOTE — Progress Notes (Signed)
 Subjective:    Patient ID: Anne Wells, female    DOB: 09/21/1950, 73 y.o.   MRN: [id number]  Patient Care Team: Lorel Maxie LABOR, MD as PCP - General (Family Medicine) Center, [facility]) Verdene Gills, RN as Oncology Nurse Navigator Rennie Cindy SAUNDERS, MD as Consulting Physician (Oncology)  Chief Complaint  Patient presents with   Follow-up    DOE. Wheezing and cough with yellow sputum for 2 days.  Sweats at night.    BACKGROUND: The patient is a 73 year old female with a past medical history of of very severe COPD and prior tobacco abuse who presents for follow-up with regards to COPD.  The patient is also status post SBRT empirically for a right pulmonary nodule with significant PET activity.  The patient was not a candidate for invasive procedures.  She has not been seen here since 21 June 2021 after her initial evaluation.  At that time she was also noted to have chronic respiratory failure with hypoxia and was qualified for home oxygen.  HPI Discussed the use of AI scribe software for clinical note transcription with the patient, who gave verbal consent to proceed.  History of Present Illness   The patient, with a history of severe emphysema, has been experiencing increased shortness of breath since last year. The dyspnea is particularly noticeable in the morning upon waking and after physical activities such as cleaning. The patient has to use an inhaler in the morning, at night, and sometimes during the day to manage the symptoms. The patient also uses a nebulizer, which provides relief.  The patient has been coughing, with a notable episode of yellow sputum production a few days prior to the consultation. However, there have been no recent episodes. The patient is on oxygen therapy, inhalers, and nebulizers. The patient has been using Stiolto, which has been helpful.  The patient quit smoking in 2006. The patient's lung function was  measured at 37% in March of the previous year. The patient uses oxygen as needed, but not at night. The patient's oxygen provider is [facility]. The patient has no known allergies.    Review of Systems A 10 point review of systems was performed and it is as noted above otherwise negative.   Past Medical History:  Diagnosis Date   Atrial fibrillation with RVR (HCC) 02/21/2021   COPD (chronic obstructive pulmonary disease) (HCC)     Past Surgical History:  Procedure Laterality Date   Dental procedure      Patient Active Problem List   Diagnosis Date Noted   Neutropenia (HCC) 11/02/2022   Hypophosphatemia 11/01/2022   Hypotension 10/31/2022   AKI (acute kidney injury) (HCC) 10/05/2022   Acute gastritis 07/30/2021   Chronic anticoagulation    Hyponatremia    Acute gastroenteritis    Elevated lipase    Alcohol use disorder    Malignant neoplasm of right lung (HCC) 07/17/2021   COPD (chronic obstructive pulmonary disease) (HCC) 02/21/2021   Chest pain 02/21/2021   Hypokalemia 02/21/2021   Hypomagnesemia 02/21/2021   Atrial fibrillation (HCC) 02/21/2021   GERD (gastroesophageal reflux disease) 02/21/2021   Fall at home, initial encounter 02/21/2021   Nodule of upper lobe of right lung 02/21/2021   Transaminitis 03/14/2020   Pulmonary nodules 03/14/2020   Thrombocytopenia, idiopathic (HCC) 03/29/2018   Essential hypertension 12/27/2017   OSA (obstructive sleep apnea) 12/25/2016   Overweight (BMI 25.0-29.9) 03/24/2013   Iron deficiency anemia 09/20/2011   COPD, severe (HCC) 01/29/2011  Family History  Problem Relation Age of Onset   Bone cancer Mother    COPD Father    Bone cancer Brother    COPD Brother     Social History   Tobacco Use   Smoking status: Former    Current packs/day: 0.00    Average packs/day: 1 pack/day for 35.0 years (35.0 ttl pk-yrs)    Types: Cigarettes    Start date: 04/16/1969    Quit date: 04/16/2004    Years since quitting: 19.0   Smokeless  tobacco: Never  Substance Use Topics   Alcohol use: Yes    Allergies  Allergen Reactions   Shellfish Allergy Swelling    Swelling of the tongue   Penicillin G Rash    Current Meds  Medication Sig   ACETAMINOPHEN  8 HOUR 650 MG CR tablet Take 650-1,300 mg by mouth every 8 (eight) hours as needed for pain.   acyclovir (ZOVIRAX) 400 MG tablet Take 400 mg by mouth 2 (two) times daily.   albuterol  (VENTOLIN  HFA) 108 (90 Base) MCG/ACT inhaler Inhale 2 puffs into the lungs every 6 (six) hours as needed for wheezing or shortness of breath.   apixaban  (ELIQUIS ) 5 MG TABS tablet Take 1 tablet (5 mg total) by mouth 2 (two) times daily.   Budeson-Glycopyrrol-Formoterol (BREZTRI  AEROSPHERE) 160-9-4.8 MCG/ACT AERO Inhale 2 puffs into the lungs in the morning and at bedtime.   [START ON 05/13/2023] Budeson-Glycopyrrol-Formoterol (BREZTRI  AEROSPHERE) 160-9-4.8 MCG/ACT AERO Inhale 2 puffs into the lungs in the morning and at bedtime.   cyclobenzaprine (FLEXERIL) 5 MG tablet Take 5 mg by mouth 2 (two) times daily.   diltiazem  (CARDIZEM  CD) 180 MG 24 hr capsule TAKE 1 CAPSULE BY MOUTH ONCE  DAILY   doxycycline  (VIBRA -TABS) 100 MG tablet Take 1 tablet (100 mg total) by mouth 2 (two) times daily for 7 days.   enalapril  (VASOTEC ) 10 MG tablet Take 1 tablet (10 mg total) by mouth daily.   enalapril  (VASOTEC ) 10 MG tablet TAKE 1 TABLET BY MOUTH DAILY   gabapentin  (NEURONTIN ) 600 MG tablet Take 600 mg by mouth 2 (two) times daily.   JARDIANCE  25 MG TABS tablet TAKE 1 TABLET BY MOUTH DAILY   magnesium  oxide (MAG-OX) 400 MG tablet Take 1 tablet (400 mg total) by mouth daily.   methylPREDNISolone  (MEDROL  DOSEPAK) 4 MG TBPK tablet Take as directed in the package   metoprolol  succinate (TOPROL  XL) 50 MG 24 hr tablet Take 1 tablet (50 mg total) by mouth daily. Take with or immediately following a meal.   Multiple Vitamins-Minerals (MULTIVITAMIN ADULT, MINERALS,) TABS Take 1 tablet by mouth daily.   omeprazole  (PRILOSEC) 20 MG capsule Take 20 mg by mouth daily.   PACERONE  200 MG tablet TAKE 1 TABLET BY MOUTH TWICE  DAILY   [DISCONTINUED] Tiotropium Bromide-Olodaterol (STIOLTO RESPIMAT ) 2.5-2.5 MCG/ACT AERS Inhale 2 Inhalers into the lungs daily at 12 noon.    Immunization History  Administered Date(s) Administered   Influenza Split 03/10/2007, 03/16/2008, 03/01/2009, 05/30/2010, 01/29/2011   Influenza, High Dose Seasonal PF 02/07/2016, 12/25/2016, 01/17/2018, 01/19/2019, 05/20/2020   Influenza, Seasonal, Injecte, Preservative Fre 12/26/2011   Influenza,inj,Quad PF,6+ Mos 01/21/2013, 01/22/2014, 12/23/2014   Influenza-Unspecified 01/29/2011, 12/26/2011, 01/21/2013, 01/22/2014, 12/23/2014   Moderna Sars-Covid-2 Vaccination 06/04/2019, 07/02/2019   Pfizer Covid-19 Vaccine Bivalent Booster 50yrs & up 09/05/2020   Pneumococcal Conjugate-13 07/28/2014, 06/22/2016   Pneumococcal Polysaccharide-23 04/15/2012, 01/19/2019   Tdap 02/14/2005, 09/29/2015        Objective:   BP 110/80 (BP  Location: Right Arm, Cuff Size: Normal)   Pulse 61   Temp (!) 97.3 F (36.3 C)   Ht 5' 8 (1.727 m)   Wt 199 lb 12.8 oz (90.6 kg)   SpO2 100%   BMI 30.38 kg/m   SpO2: 100 % O2 Device: None (Room air)  GENERAL: Well-developed, overweight woman, no acute distress, some mild use of accessories of respiration.  No conversational dyspnea. HEAD: Normocephalic, atraumatic.  EYES: Pupils equal, round, reactive to light.  No scleral icterus.  MOUTH: Poor dentition, oral mucosa moist.  No thrush. NECK: Supple. No thyromegaly. Trachea midline. No JVD.  No adenopathy. PULMONARY: Poor air entry bilaterally.  Very distant breath sounds, poor air movement.  Scattered rhonchi, rare wheezes noted.  Increased AP diameter (barrel chest). CARDIOVASCULAR: S1 and S2. Regular rate and rhythm.  No rubs, murmurs or gallops heard. ABDOMEN: Benign. MUSCULOSKELETAL: No joint deformity, no clubbing, no edema.  NEUROLOGIC: No overt  focal deficit, no gait disturbance, speech is fluent. SKIN: Intact,warm,dry. PSYCH: Mood and behavior normal.  Ambulatory oxymetry was performed today:  At rest on room air oxygen saturation was 100%, the patient ambulated at a moderate rate pace, completed 2 laps, O2 nadir 95%, severe shortness of breath.  Resting heart rate was seen to bpm at maximum for this exercise 65 bpm.  Patient has a very blunted response to exercise with regards to pulse rate likely due to beta-blocker.     Assessment & Plan:     ICD-10-CM   1. Acute bronchitis with COPD (HCC)  J44.0    J20.9     2. Stage 3 severe COPD by GOLD classification (HCC)  J44.9 Pulse oximetry, overnight    3. Bullous emphysema (HCC) - SEVERE  J43.9     4. SOB (shortness of breath)  R06.02 Pulse oximetry, overnight      Orders Placed This Encounter  Procedures   Pulse oximetry, overnight    Standing Status:   Future    Expiration Date:   04/22/2024    Scheduling Instructions:     ? [facility]    Meds ordered this encounter  Medications   Budeson-Glycopyrrol-Formoterol (BREZTRI  AEROSPHERE) 160-9-4.8 MCG/ACT AERO    Sig: Inhale 2 puffs into the lungs in the morning and at bedtime.    Dispense:  11.8 g    Refill:  0    Lot Number?:   [id number]    Expiration Date?:   05/17/2025    Manufacturer?:   [employer] [[id number]]    Quantity:   2   Budeson-Glycopyrrol-Formoterol (BREZTRI  AEROSPHERE) 160-9-4.8 MCG/ACT AERO    Sig: Inhale 2 puffs into the lungs in the morning and at bedtime.    Dispense:  3 each    Refill:  4   methylPREDNISolone  (MEDROL  DOSEPAK) 4 MG TBPK tablet    Sig: Take as directed in the package    Dispense:  21 tablet    Refill:  0   doxycycline  (VIBRA -TABS) 100 MG tablet    Sig: Take 1 tablet (100 mg total) by mouth 2 (two) times daily for 7 days.    Dispense:  14 tablet    Refill:  0   Discussion:    Chronic Obstructive Pulmonary Disease (COPD) Presents with chronic dyspnea, particularly in the mornings and  during physical activities. Uses oxygen, inhalers, and nebulizers. Lung function at 37% (March 2023). Non-smoker since 2006. Recent productive cough with yellow sputum. Current inhaler (Stiolto) provides partial relief. Discussed switching to Breztri  for better symptom control,  covered by insurance. Informed about the benefits of regular use. Discussed azithromycin  and prednisone  for acute symptom management and exacerbation prevention. Emphasized monitoring nocturnal oxygen levels to assess the need for nighttime oxygen therapy. - Switch inhaler to Breztri , two puffs twice daily - Prescribe azithromycin  (Z-Pak) - Prescribe short course of prednisone  - Monitor nocturnal oxygen levels - Send DME order to [facility] for nighttime oxygen monitoring - Follow-up in 6-8 weeks.    Shortness of Breath Patient's ambulatory oximetry shows a BLUNTED response to exercise with regards to pulse rate.  This may be due to beta-blocker therapy. - Consider change of therapy, blunted HR response to exercise with beta blocker -  No significant O2 desaturations with exercise   Advised if symptoms do not improve or worsen, to please contact office for sooner follow up or seek emergency care.    I spent 40 minutes of dedicated to the care of this patient on the date of this encounter to include pre-visit review of records, face-to-face time with the patient discussing conditions above, post visit ordering of testing, clinical documentation with the electronic health record, making appropriate referrals as documented, and communicating necessary findings to members of the patients care team.   C. Leita Sanders, MD Advanced Bronchoscopy P[facility]    *This note was dictated using voice recognition software/Dragon.  Despite best efforts to proofread, errors can occur which can change the meaning. Any transcriptional errors that result from this process are unintentional and may not be fully  corrected at the time of dictation.

## 2023-04-23 NOTE — Patient Instructions (Signed)
 VISIT SUMMARY:  During today's visit, we discussed your ongoing issues with shortness of breath and chronic obstructive pulmonary disease (COPD). We reviewed your current treatment plan and made some adjustments to better manage your symptoms.  YOUR PLAN:  -CHRONIC OBSTRUCTIVE PULMONARY DISEASE (COPD): COPD is a chronic lung condition that makes it hard to breathe. It is often caused by smoking or long-term exposure to lung irritants. We discussed switching your inhaler to Breztri , which you should use two puffs twice daily. This change aims to improve your symptom control. Additionally, we prescribed doxycycline  and a short course of prednisone  to help manage acute symptoms and prevent exacerbations. We also emphasized the importance of monitoring your oxygen levels at night to determine if you need nighttime oxygen therapy. A note will be sent to [facility] for nighttime oxygen monitoring.  INSTRUCTIONS:  Please switch to the Breztri  inhaler, using two puffs twice daily. Take the prescribed doxycycline  and complete the short course of prednisone  as directed. Monitor your oxygen levels at night, and we will follow up in 6-8 weeks to assess your progress and determine if any further adjustments are needed.

## 2023-05-15 ENCOUNTER — Ambulatory Visit: Admission: RE | Admit: 2023-05-15 | Payer: 59 | Source: Ambulatory Visit

## 2023-05-17 ENCOUNTER — Other Ambulatory Visit: Payer: Self-pay | Admitting: Cardiovascular Disease

## 2023-05-17 DIAGNOSIS — I480 Paroxysmal atrial fibrillation: Secondary | ICD-10-CM

## 2023-05-17 DIAGNOSIS — R0602 Shortness of breath: Secondary | ICD-10-CM

## 2023-05-17 DIAGNOSIS — I5033 Acute on chronic diastolic (congestive) heart failure: Secondary | ICD-10-CM

## 2023-05-17 DIAGNOSIS — I10 Essential (primary) hypertension: Secondary | ICD-10-CM

## 2023-05-17 DIAGNOSIS — J449 Chronic obstructive pulmonary disease, unspecified: Secondary | ICD-10-CM

## 2023-05-27 ENCOUNTER — Other Ambulatory Visit: Payer: Self-pay | Admitting: Cardiovascular Disease

## 2023-05-30 ENCOUNTER — Ambulatory Visit: Payer: 59 | Admitting: Radiation Oncology

## 2023-05-30 ENCOUNTER — Ambulatory Visit
Admission: RE | Admit: 2023-05-30 | Discharge: 2023-05-30 | Disposition: A | Discharge status: Home / Self Care | Payer: 59 | Source: Ambulatory Visit | Attending: Radiation Oncology | Admitting: Radiation Oncology

## 2023-05-30 DIAGNOSIS — R911 Solitary pulmonary nodule: Secondary | ICD-10-CM | POA: Insufficient documentation

## 2023-05-30 MED ORDER — IOHEXOL 300 MG/ML  SOLN
75.0000 mL | Freq: Once | INTRAMUSCULAR | Status: AC | PRN
  Administered 2023-05-30: 75 mL via INTRAVENOUS

## 2023-06-07 ENCOUNTER — Ambulatory Visit: Payer: 59 | Admitting: Pulmonary Disease

## 2023-06-07 ENCOUNTER — Other Ambulatory Visit: Payer: Self-pay | Admitting: Pulmonary Disease

## 2023-06-20 ENCOUNTER — Ambulatory Visit
Admission: RE | Admit: 2023-06-20 | Discharge: 2023-06-20 | Disposition: A | Discharge status: Home / Self Care | Payer: 59 | Source: Ambulatory Visit | Attending: Radiation Oncology | Admitting: Radiation Oncology

## 2023-06-20 ENCOUNTER — Encounter: Payer: Self-pay | Admitting: Radiation Oncology

## 2023-06-20 VITALS — BP 127/82 | HR 76 | Temp 98.3°F | Resp 16 | Wt 192.0 lb

## 2023-06-20 DIAGNOSIS — C3411 Malignant neoplasm of upper lobe, right bronchus or lung: Secondary | ICD-10-CM | POA: Diagnosis present

## 2023-06-20 DIAGNOSIS — R911 Solitary pulmonary nodule: Secondary | ICD-10-CM

## 2023-06-20 DIAGNOSIS — Z923 Personal history of irradiation: Secondary | ICD-10-CM | POA: Insufficient documentation

## 2023-06-20 DIAGNOSIS — J4489 Other specified chronic obstructive pulmonary disease: Secondary | ICD-10-CM | POA: Diagnosis not present

## 2023-06-20 NOTE — Progress Notes (Signed)
 Radiation Oncology Follow up Note  Name: Anne Wells   Date:   06/20/2023 MRN:  [id number] DOB: November 23, 1950    This 73 y.o. 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  REFERRING PROVIDER: Emogene Morgan, MD  HPI: Patient is a 73 year old female now out 2 years having completed SBRT to her right upper lobe for presumed stage I non-small cell lung cancer seen today in routine follow-up she is doing well..  She still has a cough is using inhalers is under the direct care of pulmonology for COPD.  She recently had her inhaler switched to [facility] which seems to be working well.  She is also on a short course of antibiotic therapy.  She had a recent CT scan of her chest showing increased irregular band of consolidation in the right upper lobe measuring 7 cm in greatest dimension appearance favoring radiation pneumonitis fibrosis with continued surveillance recommended.  She also has evidence of reactive airway disease disease.  COMPLICATIONS OF TREATMENT: none  FOLLOW UP COMPLIANCE: keeps appointments   PHYSICAL EXAM:  BP 127/82   Pulse 76   Temp 98.3 F (36.8 C) (Tympanic)   Resp 16   Wt 192 lb (87.1 kg)   BMI 29.19 kg/m  Well-developed well-nourished patient in NAD. HEENT reveals PERLA, EOMI, discs not visualized.  Oral cavity is clear. No oral mucosal lesions are identified. Neck is clear without evidence of cervical or supraclavicular adenopathy. Lungs are clear to A&P. Cardiac examination is essentially unremarkable with regular rate and rhythm without murmur rub or thrill. Abdomen is benign with no organomegaly or masses noted. Motor sensory and DTR levels are equal and symmetric in the upper and lower extremities. Cranial nerves II through XII are grossly intact. Proprioception is intact. No peripheral adenopathy or edema is identified. No motor or sensory levels are noted. Crude visual  fields are within normal range.  RADIOLOGY RESULTS: CT scans reviewed compatible with above-stated findings  PLAN: Present time patient is doing well no evidence of progressive disease by CT criteria because of the increase in fibrosis in her treated area of lung all since again see her out in 6 months with a repeat CT scan should it be stable at that time we will go out for once a year visits.  Patient comprehends my recommendations well.  I would like to take this opportunity to thank you for allowing me to participate in the care of your patient.Carmina Miller, MD

## 2023-07-07 ENCOUNTER — Other Ambulatory Visit: Payer: Self-pay | Admitting: Cardiovascular Disease

## 2023-07-07 DIAGNOSIS — I480 Paroxysmal atrial fibrillation: Secondary | ICD-10-CM

## 2023-08-22 ENCOUNTER — Other Ambulatory Visit: Payer: Self-pay | Admitting: Cardiovascular Disease

## 2023-08-22 DIAGNOSIS — I480 Paroxysmal atrial fibrillation: Secondary | ICD-10-CM

## 2023-09-23 ENCOUNTER — Other Ambulatory Visit: Payer: Self-pay | Admitting: Cardiovascular Disease

## 2023-10-01 ENCOUNTER — Other Ambulatory Visit: Payer: Self-pay | Admitting: Cardiovascular Disease

## 2023-10-01 DIAGNOSIS — I480 Paroxysmal atrial fibrillation: Secondary | ICD-10-CM

## 2023-10-01 DIAGNOSIS — I10 Essential (primary) hypertension: Secondary | ICD-10-CM

## 2023-11-21 ENCOUNTER — Other Ambulatory Visit: Payer: Self-pay | Admitting: Cardiology

## 2023-11-21 DIAGNOSIS — I480 Paroxysmal atrial fibrillation: Secondary | ICD-10-CM

## 2023-12-18 ENCOUNTER — Other Ambulatory Visit: Payer: Self-pay | Admitting: Cardiovascular Disease

## 2023-12-18 DIAGNOSIS — I480 Paroxysmal atrial fibrillation: Secondary | ICD-10-CM

## 2023-12-18 DIAGNOSIS — J449 Chronic obstructive pulmonary disease, unspecified: Secondary | ICD-10-CM

## 2023-12-18 DIAGNOSIS — I10 Essential (primary) hypertension: Secondary | ICD-10-CM

## 2023-12-18 DIAGNOSIS — I5033 Acute on chronic diastolic (congestive) heart failure: Secondary | ICD-10-CM

## 2023-12-18 DIAGNOSIS — R0602 Shortness of breath: Secondary | ICD-10-CM

## 2023-12-23 ENCOUNTER — Ambulatory Visit: Admission: RE | Admit: 2023-12-23 | Source: Ambulatory Visit

## 2023-12-30 ENCOUNTER — Ambulatory Visit: Admitting: Radiation Oncology

## 2024-01-03 ENCOUNTER — Other Ambulatory Visit: Payer: Self-pay | Admitting: Cardiovascular Disease

## 2024-01-03 DIAGNOSIS — I480 Paroxysmal atrial fibrillation: Secondary | ICD-10-CM

## 2024-01-03 DIAGNOSIS — I10 Essential (primary) hypertension: Secondary | ICD-10-CM

## 2024-01-20 ENCOUNTER — Ambulatory Visit: Admission: RE | Admit: 2024-01-20 | Source: Ambulatory Visit

## 2024-01-24 ENCOUNTER — Ambulatory Visit
Admission: RE | Admit: 2024-01-24 | Discharge: 2024-01-24 | Disposition: A | Discharge status: Home / Self Care | Source: Ambulatory Visit | Attending: Radiation Oncology | Admitting: Radiation Oncology

## 2024-01-24 DIAGNOSIS — R911 Solitary pulmonary nodule: Secondary | ICD-10-CM | POA: Diagnosis present

## 2024-01-24 MED ORDER — IOHEXOL 300 MG/ML  SOLN
75.0000 mL | Freq: Once | INTRAMUSCULAR | Status: AC | PRN
  Administered 2024-01-24: 75 mL via INTRAVENOUS

## 2024-01-27 ENCOUNTER — Encounter: Payer: Self-pay | Admitting: Radiation Oncology

## 2024-01-27 ENCOUNTER — Ambulatory Visit
Admission: RE | Admit: 2024-01-27 | Discharge: 2024-01-27 | Disposition: A | Discharge status: Home / Self Care | Source: Ambulatory Visit | Attending: Radiation Oncology | Admitting: Radiation Oncology

## 2024-01-27 VITALS — BP 146/76 | HR 61 | Temp 97.0°F | Resp 18 | Ht 68.5 in | Wt 195.6 lb

## 2024-01-27 DIAGNOSIS — Z923 Personal history of irradiation: Secondary | ICD-10-CM | POA: Diagnosis not present

## 2024-01-27 DIAGNOSIS — R918 Other nonspecific abnormal finding of lung field: Secondary | ICD-10-CM

## 2024-01-27 DIAGNOSIS — R911 Solitary pulmonary nodule: Secondary | ICD-10-CM | POA: Insufficient documentation

## 2024-01-27 NOTE — Progress Notes (Signed)
 Radiation Oncology Follow up Note  Name: Anne Wells   Date:   01/27/2024 MRN:  [id number] DOB: Jun 20, 1950    This 73 y.o. 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  REFERRING PROVIDER: Lorel Maxie LABOR, MD  HPI: Patient is a 73 year old female now out 2-1/2 years having completed SBRT treatment to her right upper lobe for presumed stage I non-small cell lung cancer.  Seen today in routine follow-up she is doing well.  She specifically denies cough hemoptysis chest tightness or any change in her pulmonary status.  She had a recent CT scan of her chest showing.  Stable sharply marginated masslike fibrosis in peripheral right upper lobe no evidence of local recurrence disease.  No metastatic disease in the chest.  COMPLICATIONS OF TREATMENT: none  FOLLOW UP COMPLIANCE: keeps appointments   PHYSICAL EXAM:  BP (!) 146/76   Pulse 61   Temp (!) 97 F (36.1 C)   Resp 18   Ht 5' 8.5 (1.74 m)   Wt 195 lb 9.6 oz (88.7 kg)   BMI 29.31 kg/m  Well-developed well-nourished patient in NAD. HEENT reveals PERLA, EOMI, discs not visualized.  Oral cavity is clear. No oral mucosal lesions are identified. Neck is clear without evidence of cervical or supraclavicular adenopathy. Lungs are clear to A&P. Cardiac examination is essentially unremarkable with regular rate and rhythm without murmur rub or thrill. Abdomen is benign with no organomegaly or masses noted. Motor sensory and DTR levels are equal and symmetric in the upper and lower extremities. Cranial nerves II through XII are grossly intact. Proprioception is intact. No peripheral adenopathy or edema is identified. No motor or sensory levels are noted. Crude visual fields are within normal range.  RADIOLOGY RESULTS: Serial CT scans reviewed compatible with above-stated findings  PLAN: Present time patient is doing well clinically with no  evidence of disease or progression of disease.  Of asked to see her back in 1 year for follow-up with a repeat CT scan of her chest.  Patient knows to call sooner with any concerns.  I would like to take this opportunity to thank you for allowing me to participate in the care of your patient.SABRA Marcey Penton, MD

## 2024-02-16 ENCOUNTER — Other Ambulatory Visit: Payer: Self-pay | Admitting: Pulmonary Disease

## 2024-02-19 ENCOUNTER — Other Ambulatory Visit: Payer: Self-pay | Admitting: Cardiology

## 2024-02-19 DIAGNOSIS — I480 Paroxysmal atrial fibrillation: Secondary | ICD-10-CM

## 2024-03-27 ENCOUNTER — Other Ambulatory Visit: Payer: Self-pay

## 2024-03-27 ENCOUNTER — Emergency Department

## 2024-03-27 ENCOUNTER — Emergency Department
Admission: EM | Admit: 2024-03-27 | Discharge: 2024-03-27 | Disposition: A | Discharge status: Home / Self Care | Attending: Emergency Medicine | Admitting: Emergency Medicine

## 2024-03-27 DIAGNOSIS — W2203XA Walked into furniture, initial encounter: Secondary | ICD-10-CM | POA: Insufficient documentation

## 2024-03-27 DIAGNOSIS — Z7901 Long term (current) use of anticoagulants: Secondary | ICD-10-CM | POA: Insufficient documentation

## 2024-03-27 DIAGNOSIS — J449 Chronic obstructive pulmonary disease, unspecified: Secondary | ICD-10-CM | POA: Insufficient documentation

## 2024-03-27 DIAGNOSIS — S3011XA Contusion of abdominal wall, initial encounter: Secondary | ICD-10-CM | POA: Insufficient documentation

## 2024-03-27 DIAGNOSIS — S2242XA Multiple fractures of ribs, left side, initial encounter for closed fracture: Secondary | ICD-10-CM | POA: Insufficient documentation

## 2024-03-27 MED ORDER — OXYCODONE-ACETAMINOPHEN 5-325 MG PO TABS
1.0000 | ORAL_TABLET | Freq: Once | ORAL | Status: AC
  Administered 2024-03-27: 1 via ORAL
  Filled 2024-03-27: qty 1

## 2024-03-27 MED ORDER — OXYCODONE-ACETAMINOPHEN 5-325 MG PO TABS: 1.0000 | ORAL_TABLET | ORAL | 0 refills | Status: AC | PRN

## 2024-03-27 NOTE — ED Triage Notes (Signed)
 Pt reports last Friday she fell into a dresser injuring her left ribs.

## 2024-03-27 NOTE — Discharge Instructions (Signed)
 Please take your pain medication as needed but only as prescribed.  Do not drink alcohol or drive while taking pain medication.  Please use your incentive spirometer several times per hour while awake for the next 7 days to help prevent pneumonia.  Please follow-up with your doctor regarding today's ER visit and your rib fractures.  Return to the emergency department for any worsening pain, trouble breathing, fever or any other symptom concerning to yourself.

## 2024-03-27 NOTE — ED Provider Notes (Signed)
 [facility] Provider Note    Event Date/Time   First MD Initiated Contact with Patient 03/27/24 0501     (approximate)  History   Chief Complaint: Fall  HPI  Anne Wells is a 73 y.o. female with a past med history of atrial fibrillation on Eliquis , COPD, presents to the emergency department after a fall.  According to the patient 1 week ago she had a fall and landed on her nightstand.  Has been experiencing significant left chest wall pain ever since.  Husband finally convinced the patient to come to the emergency department today for an evaluation.  Patient states the pain is improving states she is now able to speak in full sentences again she says before she was having trouble doing that due to the pain.  Did not hit her head.  No LOC.  No headache.  Physical Exam   Triage Vital Signs: ED Triage Vitals  Encounter Vitals Group     BP 03/27/24 0425 111/63     Girls Systolic BP Percentile --      Girls Diastolic BP Percentile --      Boys Systolic BP Percentile --      Boys Diastolic BP Percentile --      Pulse Rate 03/27/24 0419 80     Resp 03/27/24 0419 20     Temp 03/27/24 0419 97.6 F (36.4 C)     Temp src --      SpO2 03/27/24 0419 96 %     Weight 03/27/24 0418 180 lb (81.6 kg)     Height 03/27/24 0418 5' 8 (1.727 m)     Head Circumference --      Peak Flow --      Pain Score 03/27/24 0418 10     Pain Loc --      Pain Education --      Exclude from Growth Chart --     Most recent vital signs: Vitals:   03/27/24 0419 03/27/24 0425  BP:  111/63  Pulse: 80   Resp: 20   Temp: 97.6 F (36.4 C)   SpO2: 96%     General: Awake, no distress.  CV:  Good peripheral perfusion.   Resp:  Normal effort.  Equal breath sounds bilaterally.  Moderate left lateral chest wall tenderness.  Mild ecchymosis. Abd:  No distention.  Abdomen is nontender to the left side.   ED Results / Procedures / Treatments   RADIOLOGY  I have reviewed  interpreted the chest x-ray images.  No obvious consolidation seen on my evaluation. Radiology has read the x-ray as consistent with 910 and 11th rib fractures on the left.   MEDICATIONS ORDERED IN ED: Medications - No data to display   IMPRESSION / MDM / ASSESSMENT AND PLAN / ED COURSE  I reviewed the triage vital signs and the nursing notes.  Patient's presentation is most consistent with acute illness / injury with system symptoms.  Patient presents to the emergency department after a fall 1 week ago.  Patient has continued left lateral chest wall tenderness and discomfort worse with movement or palpation.  Mild ecchymosis to this area nontender abdomen.  Lungs appear clear.  Vital signs reassuring.  Chest x-ray consistent with 3 rib fractures.  Discussed with the patient use of an incentive spirometer several times per hour while awake for the next 7 days.  Will also place the patient on pain medication and have the patient follow-up with her doctor.  Patient agreeable to plan of care.  FINAL CLINICAL IMPRESSION(S) / ED DIAGNOSES   Multiple rib fractures   Note:  This document was prepared using Dragon voice recognition software and may include unintentional dictation errors.   Dorothyann Drivers, MD 03/27/24 [phone]

## 2024-04-22 ENCOUNTER — Other Ambulatory Visit: Payer: Self-pay | Admitting: Cardiovascular Disease

## 2024-04-23 ENCOUNTER — Other Ambulatory Visit: Payer: Self-pay | Admitting: Pulmonary Disease

## 2024-05-05 ENCOUNTER — Encounter: Payer: Self-pay | Admitting: Pulmonary Disease

## 2024-05-05 ENCOUNTER — Ambulatory Visit (INDEPENDENT_AMBULATORY_CARE_PROVIDER_SITE_OTHER): Admitting: Pulmonary Disease

## 2024-05-05 VITALS — BP 120/82 | HR 62 | Temp 98.1°F | Ht 68.5 in | Wt 193.0 lb

## 2024-05-05 DIAGNOSIS — J449 Chronic obstructive pulmonary disease, unspecified: Secondary | ICD-10-CM

## 2024-05-05 DIAGNOSIS — R931 Abnormal findings on diagnostic imaging of heart and coronary circulation: Secondary | ICD-10-CM | POA: Diagnosis not present

## 2024-05-05 DIAGNOSIS — Z87891 Personal history of nicotine dependence: Secondary | ICD-10-CM

## 2024-05-05 DIAGNOSIS — R0609 Other forms of dyspnea: Secondary | ICD-10-CM

## 2024-05-05 DIAGNOSIS — R0602 Shortness of breath: Secondary | ICD-10-CM

## 2024-05-05 DIAGNOSIS — J439 Emphysema, unspecified: Secondary | ICD-10-CM

## 2024-05-05 MED ORDER — METHYLPREDNISOLONE 4 MG PO TBPK: ORAL_TABLET | ORAL | 0 refills | Status: AC

## 2024-05-05 MED ORDER — BREZTRI AEROSPHERE 160-9-4.8 MCG/ACT IN AERO: 2.0000 | INHALATION_SPRAY | Freq: Two times a day (BID) | RESPIRATORY_TRACT | 4 refills | Status: AC

## 2024-05-05 NOTE — Progress Notes (Unsigned)
 "  Subjective:    Patient ID: Anne Wells, female    DOB: April 06, 1951, 74 y.o.   MRN: [id number]  Patient Care Team: Lorel Maxie LABOR, MD as PCP - General (Family Medicine) Center, [facility]) Verdene Gills, RN as Oncology Nurse Navigator Rennie Cindy SAUNDERS, MD as Consulting Physician (Oncology)  Chief Complaint  Patient presents with   Shortness of Breath    SOB with any activity.  Dry cough and wheezing.  Sx x 3 months.  Patient states cannot walk through the house without stopping due to SOB.    BACKGROUND:The patient is a 74 year old female with a past medical history of of very severe COPD and prior tobacco abuse who presents for follow-up with regards to COPD.  The patient is also status post SBRT empirically for a right pulmonary nodule with significant PET activity.  The patient was not a candidate for invasive procedures.  She has not been seen here since 23 April 2023 after a hiatus of 2 years of not being seen in the clinic.  She presents today for evaluation of increased shortness of breath with activity.  HPI Discussed the use of AI scribe software for clinical note transcription with the patient, who gave verbal consent to proceed.  History of Present Illness   Anne Wells is a 74 year old female with severe COPD who presents with worsening shortness of breath.  She presents with her husband today.  She has experienced worsening shortness of breath over the past four to five years however symptoms have worsened over the last 3 months. Her leg feels 'a little short a lot.' She uses inhalers and a nebulizer at home, which provide some relief. Additionally, she has an oxygen tank and uses Breztri , which she feels helps 'a little bit.'  She has not used prednisone  in over a year and is unsure if she has been taking it recently. She reports occasional wheezing and sometimes produces clear sputum when coughing, though it can be yellowish  at times.  She was last seen by the same provider a year ago and has since visited [facility] for her condition.     She has not had any fevers chills or sweats.  No chest pain  Review of Systems A 10 point review of systems was performed and it is as noted above otherwise negative.   Past Medical History:  Diagnosis Date   Atrial fibrillation with RVR (HCC) 02/21/2021   COPD (chronic obstructive pulmonary disease) (HCC)     Past Surgical History:  Procedure Laterality Date   Dental procedure      Patient Active Problem List   Diagnosis Date Noted   Neutropenia 11/02/2022   Hypophosphatemia 11/01/2022   Hypotension 10/31/2022   AKI (acute kidney injury) 10/05/2022   Acute gastritis 07/30/2021   Chronic anticoagulation    Hyponatremia    Acute gastroenteritis    Elevated lipase    Alcohol use disorder    Malignant neoplasm of right lung (HCC) 07/17/2021   COPD (chronic obstructive pulmonary disease) (HCC) 02/21/2021   Chest pain 02/21/2021   Hypokalemia 02/21/2021   Hypomagnesemia 02/21/2021   Atrial fibrillation (HCC) 02/21/2021   GERD (gastroesophageal reflux disease) 02/21/2021   Fall at home, initial encounter 02/21/2021   Nodule of upper lobe of right lung 02/21/2021   Transaminitis 03/14/2020   Pulmonary nodules 03/14/2020   Thrombocytopenia, idiopathic (HCC) 03/29/2018   Essential hypertension 12/27/2017   OSA (obstructive sleep apnea) 12/25/2016  Overweight (BMI 25.0-29.9) 03/24/2013   Iron deficiency anemia 09/20/2011   COPD, severe (HCC) 01/29/2011    Family History  Problem Relation Age of Onset   Bone cancer Mother    COPD Father    Bone cancer Brother    COPD Brother     Social History   Tobacco Use   Smoking status: Former    Current packs/day: 0.00    Average packs/day: 1 pack/day for 35.0 years (35.0 ttl pk-yrs)    Types: Cigarettes    Start date: 04/16/1969    Quit date: 04/16/2004    Years since quitting: 20.0   Smokeless tobacco: Never   Substance Use Topics   Alcohol use: Yes    Allergies[1]  Active Medications[2]  Immunization History  Administered Date(s) Administered   INFLUENZA, HIGH DOSE SEASONAL PF 02/07/2016, 12/25/2016, 01/17/2018, 01/19/2019, 05/20/2020   Influenza Split 03/10/2007, 03/16/2008, 03/01/2009, 05/30/2010, 01/29/2011   Influenza, Seasonal, Injecte, Preservative Fre 12/26/2011   Influenza,inj,Quad PF,6+ Mos 01/21/2013, 01/22/2014, 12/23/2014   Influenza-Unspecified 01/29/2011, 12/26/2011, 01/21/2013, 01/22/2014, 12/23/2014   Moderna Sars-Covid-2 Vaccination 06/04/2019, 07/02/2019   Pfizer Covid-19 Vaccine Bivalent Booster 65yrs & up 09/05/2020   Pneumococcal Conjugate-13 07/28/2014, 06/22/2016   Pneumococcal Polysaccharide-23 04/15/2012, 01/19/2019   Tdap 02/14/2005, 09/29/2015        Objective:     Vitals:   05/05/24 0947  BP: 120/82  Pulse: 62  Temp: 98.1 F (36.7 C)  Height: 5' 8.5 (1.74 m)  Weight: 193 lb (87.5 kg)  SpO2: 93% Comment: room air  TempSrc: Oral  BMI (Calculated): 28.92     SpO2: 93 % (room air)  GENERAL: Well-developed, overweight woman, no acute distress, some mild use of accessories of respiration.  No conversational dyspnea. HEAD: Normocephalic, atraumatic.  EYES: Pupils equal, round, reactive to light.  No scleral icterus.  MOUTH: Nose/mouth/throat not examined due to masking requirements for COVID 19. NECK: Supple. No thyromegaly. Trachea midline. +JVD.  No adenopathy. PULMONARY: Poor air entry bilaterally.  Very distant breath sounds, poor air movement.  Coarse, wheezing present.  Increased AP diameter (barrel chest). CARDIOVASCULAR: S1 and S2. Regular rate and rhythm.  No rubs, murmurs or gallops heard. ABDOMEN: Benign. MUSCULOSKELETAL: No joint deformity, no clubbing, no edema.  NEUROLOGIC: No overt focal deficit, no gait disturbance, speech is fluent. SKIN: Intact,warm,dry. PSYCH: Mood and behavior normal.   Ambulatory oxymetry was performed  today:  At rest on room air oxygen saturation was 95%, the patient ambulated at a brisk pace, completed 2 laps, O2 nadir 93%, moderate to severe shortness of breath.  Resting heart rate was 105 bpm at maximum for this exercise 121 bpm.       Assessment & Plan:     ICD-10-CM   1. Stage 3 severe COPD by GOLD classification (HCC)  J44.9 Pulmonary function test    Ambulatory referral to Pharmacotherapy Clinic    2. Bullous emphysema (HCC) - SEVERE  J43.9 Ambulatory referral to Pharmacotherapy Clinic    3. SOB (shortness of breath)  R06.02 Pulmonary function test    Ambulatory referral to Pharmacotherapy Clinic    4. Dyspnea on exertion  R06.09 ECHOCARDIOGRAM COMPLETE    Ambulatory referral to Pharmacotherapy Clinic      Orders Placed This Encounter  Procedures   Ambulatory referral to Pharmacotherapy Clinic    Referral Priority:   Routine    Referral Type:   Consultation    Referral Reason:   Specialty Services Required    Number of Visits Requested:   1  ECHOCARDIOGRAM COMPLETE    Standing Status:   Future    Expected Date:   05/12/2024    Expiration Date:   05/05/2025    Where should this test be performed:   [facility]    Perflutren DEFINITY (image enhancing agent) should be administered unless hypersensitivity or allergy exist:   Administer Perflutren    Reason for exam-Echo:   Dyspnea  R06.00    Other Comments:   Pulmonary hypertension suspected   Pulmonary function test    Standing Status:   Future    Expiration Date:   05/05/2025    Where should this test be performed?:   Outpatient Pulmonary    What type of PFT is being ordered?:   Full PFT    Meds ordered this encounter  Medications   methylPREDNISolone  (MEDROL  DOSEPAK) 4 MG TBPK tablet    Sig: Take as directed in the package.  This is a taper pack.    Dispense:  21 tablet    Refill:  0   budesonide-glycopyrrolate-formoterol (BREZTRI  AEROSPHERE) 160-9-4.8 MCG/ACT AERO inhaler    Sig: Inhale 2 puffs into the  lungs in the morning and at bedtime.    Dispense:  3 each    Refill:  4   Discussion:    Chronic obstructive pulmonary disease Severe COPD with ongoing dyspnea for four to five years. Current treatment includes Breztri , nebulizer, and oxygen therapy. Breztri  provides some relief, and nebulizer use improves symptoms. Occasional clear or yellowish sputum production. No recent prednisone  use. Oxygen therapy is underutilized due to emotional concerns. - Instructed to use Breztri  two puffs twice daily regardless of symptoms. - Use albuterol  as needed via nebulizer or inhaler. - Prescribed Ohtuvayre  via nebulizer twice daily, coordinated through a specialty pharmacy. - Initiated Medrol  taper for five days to improve lung function and address bronchospasm. - Will repeat pulmonary function tests. - Ensure oxygen use at night to reduce cardiac workload. - Educated on completing full nebulizer doses to prevent tolerance.  Pulmonary hypertension under investigation Potential contribution to dyspnea. No recent cardiac evaluation. Concern for pulmonary artery hypertension contributing to symptoms. - Ordered echocardiogram to assess for pulmonary artery hypertension. - Will reassess oxygen needs at follow-up.      Follow-up will be in 4 to 6 weeks.  Advised if symptoms do not improve or worsen, to please contact office for sooner follow up or seek emergency care.    I spent 40 minutes of dedicated to the care of this patient on the date of this encounter to include pre-visit review of records, face-to-face time with the patient discussing conditions above, post visit ordering of testing, clinical documentation with the electronic health record, making appropriate referrals as documented, and communicating necessary findings to members of the patients care team.   C. Leita Sanders, MD Advanced Bronchoscopy PCCM [facility]    *This note was dictated using voice recognition  software/Dragon.  Despite best efforts to proofread, errors can occur which can change the meaning. Any transcriptional errors that result from this process are unintentional and may not be fully corrected at the time of dictation.     [1]  Allergies Allergen Reactions   Shellfish Allergy Swelling    Swelling of the tongue   Penicillin G Rash  [2]  Current Meds  Medication Sig   ACETAMINOPHEN  8 HOUR 650 MG CR tablet Take 650-1,300 mg by mouth every 8 (eight) hours as needed for pain.   acyclovir (ZOVIRAX) 400 MG tablet Take 400 mg  by mouth 2 (two) times daily.   albuterol  (PROVENTIL ) (2.5 MG/3ML) 0.083% nebulizer solution Inhale 3 mLs into the lungs every 6 (six) hours as needed.   albuterol  (VENTOLIN  HFA) 108 (90 Base) MCG/ACT inhaler INHALE 2 PUFFS BY MOUTH INTO THE LUNGS EVERY 6 HOURS AS NEEDED  FOR WHEEZING OR SHORTNESS OF  BREATH   amiodarone  (PACERONE ) 200 MG tablet TAKE 1 TABLET BY MOUTH TWICE  DAILY   apixaban  (ELIQUIS ) 5 MG TABS tablet TAKE 1 TABLET BY MOUTH TWICE DAILY   cyclobenzaprine (FLEXERIL) 5 MG tablet Take 5 mg by mouth 2 (two) times daily.   diltiazem  (CARDIZEM  CD) 180 MG 24 hr capsule TAKE 1 CAPSULE BY MOUTH TWICE DAILY   enalapril  (VASOTEC ) 10 MG tablet TAKE 1 TABLET BY MOUTH DAILY   enalapril  (VASOTEC ) 10 MG tablet TAKE 1 TABLET BY MOUTH DAILY   gabapentin  (NEURONTIN ) 600 MG tablet Take 600 mg by mouth 2 (two) times daily.   JARDIANCE  25 MG TABS tablet TAKE 1 TABLET BY MOUTH DAILY   magnesium  oxide (MAG-OX) 400 MG tablet Take 1 tablet (400 mg total) by mouth daily.   methylPREDNISolone  (MEDROL  DOSEPAK) 4 MG TBPK tablet Take as directed in the package.  This is a taper pack.   metoprolol  succinate (TOPROL -XL) 50 MG 24 hr tablet TAKE 1 TABLET BY MOUTH DAILY. TAKE WITH OR IMMEDIATELY FOLLOWING A MEAL   Multiple Vitamins-Minerals (MULTIVITAMIN ADULT, MINERALS,) TABS Take 1 tablet by mouth daily.   omeprazole (PRILOSEC) 20 MG capsule Take 20 mg by mouth daily.    oxyCODONE -acetaminophen  (PERCOCET) 5-325 MG tablet Take 1 tablet by mouth every 4 (four) hours as needed for severe pain (pain score 7-10).   [DISCONTINUED] Budeson-Glycopyrrol-Formoterol (BREZTRI  AEROSPHERE) 160-9-4.8 MCG/ACT AERO Inhale 2 puffs into the lungs in the morning and at bedtime.   [DISCONTINUED] Budeson-Glycopyrrol-Formoterol (BREZTRI  AEROSPHERE) 160-9-4.8 MCG/ACT AERO Inhale 2 puffs into the lungs in the morning and at bedtime.   [DISCONTINUED] methylPREDNISolone  (MEDROL  DOSEPAK) 4 MG TBPK tablet Take as directed in the package   "

## 2024-05-05 NOTE — Patient Instructions (Signed)
 VISIT SUMMARY:  During your visit, we discussed your worsening shortness of breath and reviewed your current treatments for COPD. We also considered the possibility of heart failure contributing to your symptoms.  YOUR PLAN:  -CHRONIC OBSTRUCTIVE PULMONARY DISEASE (COPD): COPD is a chronic lung condition that makes it hard to breathe. Your current treatment includes Breztri , a nebulizer, and oxygen therapy. You should use Breztri  two puffs twice daily regardless of symptoms and use albuterol  as needed via nebulizer or inhaler. We have also prescribed Brovana  via nebulizer twice daily and initiated a Medrol   taper for five days to improve lung function.  Please monitor your sugars frequently during the Medrol  dosing.  We will repeat pulmonary function tests and ensure you use oxygen at night to reduce cardiac workload. It's important to complete full nebulizer doses to prevent tolerance.  -HEART FAILURE: Heart failure is a condition where the heart doesn't pump blood as well as it should, which can contribute to shortness of breath. We have ordered a cardiac evaluation to assess for pulmonary artery hypertension and will reassess your oxygen needs at your follow-up visit.  INSTRUCTIONS:  Please follow the prescribed medication regimen and ensure you use your oxygen therapy at night. We will repeat pulmonary function tests and have ordered a cardiac evaluation to assess for pulmonary artery hypertension. We will reassess your oxygen needs at your follow-up visit.

## 2024-05-07 ENCOUNTER — Telehealth: Payer: Self-pay

## 2024-05-07 NOTE — Telephone Encounter (Signed)
 Received referral for new start Ohtuvayre . Opening benefits investigation in this thread.

## 2024-05-14 NOTE — Telephone Encounter (Signed)
 Received Ohtuvayre  new start paperwork. Completed form and faxed with clinicals and insurance card copy to [facility]   Phone#: [phone] Fax#: [phone]

## 2024-05-15 ENCOUNTER — Ambulatory Visit

## 2024-05-20 NOTE — Telephone Encounter (Signed)
 Received fax from [employer] with summary of benefits. Referral form for Ohtuvayre  received. Rx will be triaged to [facility] (phone: [phone]). Once benefits investigation completed, pharmacy will reach out the patient to schedule shipment. If medication is unaffordable, patient will need to express financial hardship to be referred back to [facility] for patient assistance program pre-screening.   Patient ID: [id number] Pharmacy phone: [facility] (phone: [phone]) [facility] Phone#: [phone]

## 2024-05-21 ENCOUNTER — Other Ambulatory Visit: Payer: Self-pay | Admitting: Cardiology

## 2024-05-21 DIAGNOSIS — I480 Paroxysmal atrial fibrillation: Secondary | ICD-10-CM

## 2024-06-16 ENCOUNTER — Encounter

## 2024-06-16 ENCOUNTER — Ambulatory Visit: Admitting: Pulmonary Disease

## 2025-01-18 ENCOUNTER — Ambulatory Visit

## 2025-02-01 ENCOUNTER — Ambulatory Visit: Admitting: Radiation Oncology
